# Patient Record
Sex: Female | Born: 1940 | Race: White | Hispanic: No | Marital: Married | Smoking: Current every day smoker
Health system: Southern US, Community
[De-identification: ages and names within clinical notes are randomized; demographics above are authoritative.]

## PROBLEM LIST (undated history)

## (undated) DIAGNOSIS — M545 Low back pain, unspecified: Secondary | ICD-10-CM

## (undated) DIAGNOSIS — C50419 Malignant neoplasm of upper-outer quadrant of unspecified female breast: Secondary | ICD-10-CM

## (undated) DIAGNOSIS — G20 Parkinson's disease: Secondary | ICD-10-CM

## (undated) DIAGNOSIS — Z923 Personal history of irradiation: Secondary | ICD-10-CM

## (undated) DIAGNOSIS — M810 Age-related osteoporosis without current pathological fracture: Secondary | ICD-10-CM

## (undated) DIAGNOSIS — G5730 Lesion of lateral popliteal nerve, unspecified lower limb: Secondary | ICD-10-CM

## (undated) DIAGNOSIS — M1990 Unspecified osteoarthritis, unspecified site: Secondary | ICD-10-CM

## (undated) DIAGNOSIS — E785 Hyperlipidemia, unspecified: Secondary | ICD-10-CM

## (undated) DIAGNOSIS — J45909 Unspecified asthma, uncomplicated: Secondary | ICD-10-CM

## (undated) DIAGNOSIS — I059 Rheumatic mitral valve disease, unspecified: Secondary | ICD-10-CM

## (undated) HISTORY — DX: Low back pain: M54.5

## (undated) HISTORY — DX: Parkinson's disease: G20

## (undated) HISTORY — DX: Unspecified asthma, uncomplicated: J45.909

## (undated) HISTORY — DX: Hyperlipidemia, unspecified: E78.5

## (undated) HISTORY — DX: Age-related osteoporosis without current pathological fracture: M81.0

## (undated) HISTORY — DX: Low back pain, unspecified: M54.50

## (undated) HISTORY — DX: Malignant neoplasm of upper-outer quadrant of unspecified female breast: C50.419

## (undated) HISTORY — DX: Unspecified osteoarthritis, unspecified site: M19.90

## (undated) HISTORY — DX: Rheumatic mitral valve disease, unspecified: I05.9

## (undated) HISTORY — DX: Lesion of lateral popliteal nerve, unspecified lower limb: G57.30

---

## 2006-04-20 HISTORY — PX: COLONOSCOPY: SHX174

## 2007-08-25 ENCOUNTER — Ambulatory Visit: Payer: Self-pay | Admitting: Endocrinology

## 2009-08-29 ENCOUNTER — Ambulatory Visit: Payer: Self-pay | Admitting: Endocrinology

## 2010-04-20 DIAGNOSIS — G20A1 Parkinson's disease without dyskinesia, without mention of fluctuations: Secondary | ICD-10-CM

## 2010-04-20 DIAGNOSIS — G20 Parkinson's disease: Secondary | ICD-10-CM

## 2010-04-20 DIAGNOSIS — J45909 Unspecified asthma, uncomplicated: Secondary | ICD-10-CM

## 2010-04-20 HISTORY — DX: Parkinson's disease: G20

## 2010-04-20 HISTORY — DX: Parkinson's disease without dyskinesia, without mention of fluctuations: G20.A1

## 2010-04-20 HISTORY — DX: Unspecified asthma, uncomplicated: J45.909

## 2010-09-17 ENCOUNTER — Ambulatory Visit: Payer: Self-pay | Admitting: Endocrinology

## 2010-09-29 ENCOUNTER — Ambulatory Visit: Payer: Self-pay | Admitting: Endocrinology

## 2011-04-21 DIAGNOSIS — C50419 Malignant neoplasm of upper-outer quadrant of unspecified female breast: Secondary | ICD-10-CM

## 2011-04-21 DIAGNOSIS — Z923 Personal history of irradiation: Secondary | ICD-10-CM

## 2011-04-21 HISTORY — DX: Personal history of irradiation: Z92.3

## 2011-04-21 HISTORY — DX: Malignant neoplasm of upper-outer quadrant of unspecified female breast: C50.419

## 2011-04-21 HISTORY — PX: BREAST SURGERY: SHX581

## 2011-10-26 ENCOUNTER — Ambulatory Visit: Payer: Self-pay | Admitting: General Surgery

## 2011-10-26 HISTORY — PX: BREAST BIOPSY: SHX20

## 2011-11-02 LAB — PATHOLOGY REPORT

## 2011-11-05 ENCOUNTER — Ambulatory Visit: Payer: Self-pay | Admitting: General Surgery

## 2011-11-05 LAB — COMPREHENSIVE METABOLIC PANEL
Albumin: 3.7 g/dL (ref 3.4–5.0)
Alkaline Phosphatase: 68 U/L (ref 50–136)
Anion Gap: 7 (ref 7–16)
BUN: 11 mg/dL (ref 7–18)
Bilirubin,Total: 1.1 mg/dL — ABNORMAL HIGH (ref 0.2–1.0)
Calcium, Total: 8.8 mg/dL (ref 8.5–10.1)
Chloride: 105 mmol/L (ref 98–107)
EGFR (African American): >60
Glucose: 85 mg/dL (ref 65–99)
Potassium: 4.1 mmol/L (ref 3.5–5.1)
SGOT(AST): 22 U/L (ref 15–37)
SGPT (ALT): 7 U/L — ABNORMAL LOW
Total Protein: 7.2 g/dL (ref 6.4–8.2)

## 2011-11-05 LAB — CBC WITH DIFFERENTIAL/PLATELET
Basophil %: 0.7 %
HCT: 38.8 % (ref 35.0–47.0)
HGB: 13.1 g/dL (ref 12.0–16.0)
Lymphocyte %: 37.3 %
Monocyte %: 8.7 %
Neutrophil #: 3.7 10*3/uL (ref 1.4–6.5)
Neutrophil %: 52.7 %
Platelet: 290 10*3/uL (ref 150–440)

## 2011-11-13 ENCOUNTER — Ambulatory Visit: Payer: Self-pay | Admitting: General Surgery

## 2011-11-13 HISTORY — PX: BREAST LUMPECTOMY: SHX2

## 2011-11-23 LAB — PATHOLOGY REPORT

## 2011-11-30 ENCOUNTER — Ambulatory Visit: Payer: Self-pay | Admitting: Radiation Oncology

## 2011-12-20 ENCOUNTER — Ambulatory Visit: Payer: Self-pay | Admitting: Radiation Oncology

## 2011-12-28 LAB — CBC CANCER CENTER
Basophil %: 0.9 %
Eosinophil %: 0.6 %
HGB: 14.4 g/dL (ref 12.0–16.0)
Lymphocyte %: 30.3 %
Monocyte %: 8.2 %
Neutrophil %: 60 %
RBC: 4.49 10*6/uL (ref 3.80–5.20)

## 2012-01-05 LAB — CBC CANCER CENTER
Basophil #: 0.1 x10 3/mm (ref 0.0–0.1)
Basophil %: 0.8 %
Eosinophil %: 0.8 %
HGB: 14.3 g/dL (ref 12.0–16.0)
Lymphocyte %: 29 %
MCH: 32 pg (ref 26.0–34.0)
Monocyte #: 0.7 x10 3/mm (ref 0.2–0.9)
Monocyte %: 9.6 %
Neutrophil %: 59.8 %
Platelet: 285 x10 3/mm (ref 150–440)
RBC: 4.46 10*6/uL (ref 3.80–5.20)
RDW: 13.9 % (ref 11.5–14.5)
WBC: 7 x10 3/mm (ref 3.6–11.0)

## 2012-01-11 LAB — CBC CANCER CENTER
Basophil %: 0.5 %
Eosinophil #: 0 x10 3/mm (ref 0.0–0.7)
Eosinophil %: 0.2 %
HGB: 14.1 g/dL (ref 12.0–16.0)
Lymphocyte %: 25 %
MCHC: 32.8 g/dL (ref 32.0–36.0)
MCV: 96 fL (ref 80–100)
Neutrophil %: 66 %
Platelet: 279 x10 3/mm (ref 150–440)
RBC: 4.49 10*6/uL (ref 3.80–5.20)

## 2012-01-18 LAB — CBC CANCER CENTER
Basophil #: 0 x10 3/mm (ref 0.0–0.1)
Basophil %: 0.8 %
Eosinophil #: 0 x10 3/mm (ref 0.0–0.7)
Eosinophil %: 0.7 %
HCT: 42.6 % (ref 35.0–47.0)
HGB: 14.1 g/dL (ref 12.0–16.0)
Lymphocyte %: 26.6 %
MCHC: 33.1 g/dL (ref 32.0–36.0)
Monocyte %: 10.2 %
Neutrophil #: 3.4 x10 3/mm (ref 1.4–6.5)
Neutrophil %: 61.7 %
RBC: 4.44 10*6/uL (ref 3.80–5.20)
WBC: 5.5 x10 3/mm (ref 3.6–11.0)

## 2012-01-19 ENCOUNTER — Ambulatory Visit: Payer: Self-pay | Admitting: Radiation Oncology

## 2012-01-25 LAB — CBC CANCER CENTER
Eosinophil #: 0 x10 3/mm (ref 0.0–0.7)
Eosinophil %: 0.6 %
HGB: 14 g/dL (ref 12.0–16.0)
Lymphocyte %: 16.2 %
MCH: 31.8 pg (ref 26.0–34.0)
MCHC: 33.2 g/dL (ref 32.0–36.0)
Monocyte #: 0.7 x10 3/mm (ref 0.2–0.9)
Neutrophil %: 72.7 %
Platelet: 245 x10 3/mm (ref 150–440)
RBC: 4.4 10*6/uL (ref 3.80–5.20)

## 2012-02-01 LAB — CBC CANCER CENTER
Eosinophil %: 1.3 %
HCT: 42.3 % (ref 35.0–47.0)
Lymphocyte #: 1 x10 3/mm (ref 1.0–3.6)
MCV: 96 fL (ref 80–100)
Monocyte %: 12.7 %
Neutrophil #: 3.2 x10 3/mm (ref 1.4–6.5)
RBC: 4.41 10*6/uL (ref 3.80–5.20)
WBC: 4.9 x10 3/mm (ref 3.6–11.0)

## 2012-02-19 ENCOUNTER — Ambulatory Visit: Payer: Self-pay | Admitting: Radiation Oncology

## 2012-03-20 ENCOUNTER — Ambulatory Visit: Payer: Self-pay | Admitting: Radiation Oncology

## 2012-07-19 ENCOUNTER — Encounter: Payer: Self-pay | Admitting: Neurology

## 2012-07-19 ENCOUNTER — Encounter: Payer: Self-pay | Admitting: *Deleted

## 2012-07-19 ENCOUNTER — Ambulatory Visit (INDEPENDENT_AMBULATORY_CARE_PROVIDER_SITE_OTHER): Payer: Medicare Other | Admitting: Neurology

## 2012-07-19 VITALS — BP 126/76 | HR 72 | Temp 98.5°F | Ht 66.0 in | Wt 132.0 lb

## 2012-07-19 DIAGNOSIS — M810 Age-related osteoporosis without current pathological fracture: Secondary | ICD-10-CM | POA: Insufficient documentation

## 2012-07-19 DIAGNOSIS — M25559 Pain in unspecified hip: Secondary | ICD-10-CM | POA: Insufficient documentation

## 2012-07-19 DIAGNOSIS — G5730 Lesion of lateral popliteal nerve, unspecified lower limb: Secondary | ICD-10-CM | POA: Insufficient documentation

## 2012-07-19 DIAGNOSIS — M25552 Pain in left hip: Secondary | ICD-10-CM

## 2012-07-19 DIAGNOSIS — M549 Dorsalgia, unspecified: Secondary | ICD-10-CM | POA: Insufficient documentation

## 2012-07-19 DIAGNOSIS — G20 Parkinson's disease: Secondary | ICD-10-CM

## 2012-07-19 DIAGNOSIS — C801 Malignant (primary) neoplasm, unspecified: Secondary | ICD-10-CM | POA: Insufficient documentation

## 2012-07-19 DIAGNOSIS — G20A1 Parkinson's disease without dyskinesia, without mention of fluctuations: Secondary | ICD-10-CM | POA: Insufficient documentation

## 2012-07-19 MED ORDER — RASAGILINE MESYLATE 1 MG PO TABS: 1.0000 mg | ORAL_TABLET | Freq: Every day | ORAL | Status: DC

## 2012-07-19 MED ORDER — CARBIDOPA-LEVODOPA 25-100 MG PO TABS: 0.5000 | ORAL_TABLET | Freq: Three times a day (TID) | ORAL | Status: DC

## 2012-07-19 NOTE — Progress Notes (Signed)
Subjective:    Patient ID: Jasmine Colon is a 72 y.o. female.  HPI Interim history:  Jasmine Colon is a very pleasant 72 year old right-handed lady with an underlying history of breast cancer, lower back pain, osteoporosis who presents for followup consultation of her right sided predominant Parkinson's disease of approximately 4 years duration. This is her first visit with me and she is accompanied by her husband today. She is a former patient of Dr. Imagene Gurney and have been seeing him for 2 years. She was last seen by him on 04/19/2012 at which time he felt she was stable. He encouraged her to exercise regularly and this is a four-month followup for her. She is having some depression symptoms but not enough to be treated at that point. She's currently on multivitamin, Azilect 1 mg once daily, carbidopa-levodopa 25/100 mg strength half a tablet 3 times a day, namely at 8 AM, 1 PM and 6 PM. She is on a baby aspirin once daily and calcium plus vitamin D. She had stiff and sore R shoulder before any of the PD Sx started. She drives well. She has been back to see her oncologist in January. She is due to go back in May or June. Mammogram in January was benign.   I reviewed Dr. Imagene Gurney prior notes and the patient's records and below is a summary of that review:  72 year old RH WF with an approx. 4 year history of R hand and arm tremor, associated with decreased flexibility in the right arm and cramps occurring in her right hand, arm, and foot. She also notices joint pain. She has soreness in her knees, back, and left hip. She has pain in her lower back radiating into her left leg behind the knee beginning 12/2010. Her symptoms of lower back pain are made worse by using a vacuum cleaner and picking up her grandchild. The patient's mother had Parkinson's disease. In 07/2010 she was placed on Sinemet by Dr. Patrecia Pace. She noticed significant improvement in her hand writing and right arm stiffness. She has a history of  constipation and decreased sense of smell. She has no history of exposure to herbicides or pesticides. She is not on any medications that may cause parkinsonism. Uric acid, CMP, TSH, thyroxine, T3 uptake, free thyroxine index, CBC, T12, folic acid, and vitamin D were normal in the past. Cholesterol was high at 219, HDL 85, and  LDL 117. The patient smokes cigarettes daily, and has been asked to discontinue smoking. She has been encouraged to exercise, but does not do so regularly. She denies memory loss but endorses depression. She is sleeping well. Her husband reports that she acts out in her dreams at night. She has bladder urgency. Falls assessment tool score was 3. She takes Aleve for her joint pains. X-rays of the left hip on 04/24/2011 showed DJD of the lumbar spine, but was otherwise negative. She works at her [employer] one day a week on Wednesdays and takes care of her grandson 2 days per week. She had a mammogram which was abnormal and breast biopsy showed cancer. She underwent R lumpectomy on 11/13/2011 followed by 39 XRT without chemotherapy. On her last visit with Dr. Sandria Manly on 04/19/2012 = MMSE 30/30, CDT 4/4, AFT 22, Geriatric depression scale 5/15.  Her Past Medical History Is Significant For: Past Medical History  Diagnosis Date  . Parkinson's disease   . Adenocarcinoma     right breast  . Lower back pain   . Osteoporosis   .  Lesion of lateral popliteal nerve   . Hyperlipidemia   . Mitral valve disorder   . Osteoarthritis   . Osteoporosis     Her Past Surgical History Is Significant For: Past Surgical History  Procedure Laterality Date  . Cesarean section  31 years ago    Her Family History Is Significant For: Family History  Problem Relation Age of Onset  . Heart disease Brother     bypass  . Stroke Mother   . Heart failure Father     Her Social History Is Significant For: History   Social History  . Marital Status: Married    Spouse Name: N/A    Number of  Children: N/A  . Years of Education: N/A   Occupational History  . part time     [employer]   Social History Main Topics  . Smoking status: Current Every Day Smoker -- 0.50 packs/day    Types: Cigarettes  . Smokeless tobacco: None  . Alcohol Use: No  . Drug Use: No  . Sexually Active: None   Other Topics Concern  . None   Social History Narrative  . None   Her Allergies Are:  No Known Allergies:   Her Current Medications Are:  Outpatient Encounter Prescriptions as of 07/19/2012  Medication Sig Dispense Refill  . anastrozole (ARIMIDEX) 1 MG tablet Take 1 mg by mouth daily.      Marland Kitchen aspirin 81 MG tablet Take 81 mg by mouth daily.      . Calcium Carbonate-Vitamin D (CALTRATE 600+D PO) Take 1 tablet by mouth 3 (three) times daily.      . carbidopa-levodopa (SINEMET IR) 25-100 MG per tablet Take 0.5 tablets by mouth 3 (three) times daily.      . Multiple Vitamins-Minerals (WOMENS 50+ MULTI VITAMIN/MIN PO) Take 1 tablet by mouth daily.      . rasagiline (AZILECT) 0.5 MG TABS Take 1 mg by mouth daily.       No facility-administered encounter medications on file as of 07/19/2012.  :  Review of Systems  All other systems reviewed and are negative.    Objective:  Neurologic Exam  Physical Exam Physical Examination:   Filed Vitals:   07/19/12 1005  BP: 126/76  Pulse: 72  Temp: 98.5 F (36.9 C)    General Examination: The patient is a very pleasant 72 y.o. female in no acute distress.  HEENT: Normocephalic, atraumatic, pupils are equal, round and reactive to light and accommodation. Funduscopic exam is normal with sharp disc margins noted. Extraocular tracking shows mild saccadic breakdown without nystagmus noted. There is no limitation to any gaze. There is mild decrease in eye blink rate. Hearing is intact. Tympanic membranes are clear bilaterally. Face is symmetric with mild facial masking and normal facial sensation. There is no lip, neck or jaw tremor. Neck is mildly  rigid with intact passive ROM. There are no carotid bruits on auscultation. Oropharynx exam reveals moderate mouth dryness. No significant airway crowding is noted. Mallampati is class II. Tongue protrudes centrally and palate elevates symmetrically.    Chest: is clear to auscultation without wheezing, rhonchi or crackles noted.  Heart: sounds are regular and normal without murmurs, rubs or gallops noted.   Abdomen: is soft, non-tender and non-distended with normal bowel sounds appreciated on auscultation.  Extremities: There is no pitting edema in the distal lower extremities bilaterally. Pedal pulses are intact.  Skin: is warm and dry with no trophic changes noted.  Musculoskeletal: exam reveals no  obvious joint deformities, tenderness or joint swelling or erythema.  Neurologically:  Mental status: The patient is awake and alert, paying good  attention. She is able to provide her history. She is oriented to: person, place, time/date, situation, day of week, month of year and year. Her memory, attention, language and knowledge are intact. There is no aphasia, agnosia, apraxia or anomia. There is no degree of bradyphrenia. Speech is mildly hypophonic with no dysarthria noted. Mood is congruent and affect is  normal.  Cranial nerves are as described above under HEENT exam. In addition, shoulder shrug is normal with equal shoulder height noted.  Motor exam: Normal bulk, and strength for age is noted. Tone is mildly rigid with presence of cogwheeling in the right extremities. There is overall mild bradykinesia. There is no drift or rebound. Romberg is negative. Reflexes are 1+ in the upper extremities and 1+ in the lower extremities. Fine motor skills exam reveals: Finger taps are mildly impaired on the right and normal impaired on the left. Hand movements are mildly impaired on the right and minimally impaired on the left. RAP (rapid alternating patting) is mildly impaired on the right and normal  impaired on the left. Foot taps are mildly impaired on the right and mildly impaired on the left. Foot agility (in the form of heel stomping) is mildly impaired on the right and mildly impaired on the left.    Cerebellar testing shows no dysmetria or intention tremor on finger to nose testing. Heel to shin is unremarkable bilaterally. There is no truncal or gait ataxia.   Sensory exam is intact to light touch, pinprick, vibration, temperature sense and proprioception in the upper and lower extremities.   Gait, station and balance exan: She stands up from the seated position with no difficulty. No veering to one side is noted. She is not noted to lean. Posture is mildly stooped. Stance is narrow-based. She walks with decrease in stride length and pace and decreased arm swing on the right. She turns in 2 steps. Balance is not impaired. . She is able to do a toe or heel stance.     Assessment and Plan:    Assessment and Plan:  In summary, TAVARIA MACKINS is a very pleasant 72 y.o.-year old female with a history of PD, right predominant and overall mild findings. She is doing well at this time.  I had a long chat with the patient and her husband about my findings and the diagnosis, its prognosis and treatment options. We talked about medical treatments and non-pharmacological approaches. We talked about smoking cessation and maintaining a healthy lifestyle in general. I encouraged the patient to eat healthy, exercise daily and keep well hydrated, to keep a scheduled bedtime and wake time routine, to not skip any meals and eat healthy snacks in between meals and to have protein with every meal.   I recommended the following at this time: I recommended no change in her medications. I answered all their questions today and the patient and her husband were in agreement with the plan. I would like to see her back in 6 months, sooner if the need arises and encouraged them to call with any interim questions,  concerns, problems or updates and refill requests.

## 2012-07-19 NOTE — Patient Instructions (Addendum)
I think overall you are doing fairly well and are stable at this point.  I do have some generic suggestions for you today:  Please make sure that you drink plenty of fluids. I would like for you to exercise daily for example in the form of walking 20-30 minutes every day, if you can. Please keep a regular sleep-wake schedule, keep regular meal times, do not skip any meals, eat  healthy snacks in between meals, such as fruit or nuts. Try to eat protein with every meal.   Please stop smoking.   Engage in social activities in your community and with your family and try to keep up with current events by reading the newspaper or watching the news.  I do not think we need to make any changes in your medications at this point. I think you're stable enough that I can see you back in 6 months, sooner if we need to. Please call us if you have any interim questions, concerns, or problems or updates to need to discuss.  Brett Canales is my clinical assistant and will answer any of your questions and relay your messages to me and will give you my messages.   Our phone number is [phone]. We also have an after hours call service for urgent matters and there is a physician on-call for urgent questions. For any emergencies you know to call 911 or go to the nearest emergency room.

## 2012-08-19 ENCOUNTER — Ambulatory Visit: Payer: Self-pay | Admitting: Radiation Oncology

## 2012-09-18 ENCOUNTER — Ambulatory Visit: Payer: Self-pay | Admitting: Radiation Oncology

## 2012-11-04 ENCOUNTER — Encounter: Payer: Self-pay | Admitting: General Surgery

## 2012-11-15 ENCOUNTER — Ambulatory Visit: Payer: Medicare Other | Admitting: General Surgery

## 2012-12-07 ENCOUNTER — Encounter: Payer: Self-pay | Admitting: General Surgery

## 2012-12-07 ENCOUNTER — Ambulatory Visit (INDEPENDENT_AMBULATORY_CARE_PROVIDER_SITE_OTHER): Payer: Medicare Other | Admitting: General Surgery

## 2012-12-07 VITALS — BP 120/70 | HR 64 | Resp 12 | Ht 66.0 in | Wt 134.0 lb

## 2012-12-07 DIAGNOSIS — Z853 Personal history of malignant neoplasm of breast: Secondary | ICD-10-CM

## 2012-12-07 NOTE — Progress Notes (Signed)
Patient ID: Jasmine Colon, female   DOB: 17-Aug-1940, 72 y.o.   MRN: [id number]  Chief Complaint  Patient presents with  . Follow-up    6 month diagnostic mammogram     HPI Jasmine Colon is a 72 y.o. female who presents for a 6 month breast evaluation. The most recent mammogram was a diagnostic mammogram done on 11/04/12 with a birad catory 2. The patient gets regular breast mammograms and does regular self breast checks. She denies any new problems with her breasts at this time. She is 1 year post right breast lumpectomy and Sentinel Node biopsy, radiation for stage 1 cancer. Currently on Anastrozole.  HPI  Past Medical History  Diagnosis Date  . Parkinson's disease   . Adenocarcinoma     right breast  . Lower back pain   . Osteoporosis   . Lesion of lateral popliteal nerve   . Hyperlipidemia   . Mitral valve disorder   . Osteoarthritis   . Osteoporosis   . Asthma 2012  . Parkinson's disease 2012  . Personal history of malignant neoplasm of breast   . Malignant neoplasm of upper-outer quadrant of female breast 2013    T1,N0,M0. Hormone pos, Her 2 no amplified.     Past Surgical History  Procedure Laterality Date  . Breast surgery Right 2013    wide excision  . Colonoscopy  2008  . Cesarean section  31 years ago    Family History  Problem Relation Age of Onset  . Heart disease Brother     bypass  . Stroke Mother   . Heart failure Father   . Cancer Maternal Aunt     breast   . Cancer Maternal Aunt     breast    Social History History  Substance Use Topics  . Smoking status: Current Every Day Smoker -- 0.50 packs/day for 45 years    Types: Cigarettes  . Smokeless tobacco: Never Used  . Alcohol Use: No    Allergies  Allergen Reactions  . Codeine     Feels weird    Current Outpatient Prescriptions  Medication Sig Dispense Refill  . anastrozole (ARIMIDEX) 1 MG tablet Take 1 mg by mouth daily.      Marland Kitchen aspirin 81 MG tablet Take 81 mg by mouth daily.      .  Calcium Carbonate-Vitamin D (CALTRATE 600+D PO) Take 1 tablet by mouth 3 (three) times daily.      . carbidopa-levodopa (SINEMET IR) 25-100 MG per tablet Take 0.5 tablets by mouth 3 (three) times daily.  135 tablet  3  . ibandronate (BONIVA) 150 MG tablet Take 1 tablet by mouth every 30 (thirty) days.      . Multiple Vitamins-Minerals (WOMENS 50+ MULTI VITAMIN/MIN PO) Take 1 tablet by mouth daily.      . rasagiline (AZILECT) 1 MG TABS Take 1 tablet (1 mg total) by mouth daily.  90 tablet  3   No current facility-administered medications for this visit.    Review of Systems Review of Systems  Constitutional: Negative.   Respiratory: Negative.   Cardiovascular: Negative.     Blood pressure 120/70, pulse 64, resp. rate 12, height 5\' 6"  (1.676 m), weight 134 lb (60.782 kg).  Physical Exam Physical Exam  Constitutional: She is oriented to person, place, and time. She appears well-developed and well-nourished.  Eyes: Conjunctivae are normal. No scleral icterus.  Neck: No thyromegaly present.  Cardiovascular: Normal rate, regular rhythm and normal heart sounds.  No murmur heard. Pulses:      Dorsalis pedis pulses are 2+ on the right side, and 2+ on the left side.       Posterior tibial pulses are 2+ on the right side, and 2+ on the left side.  No edema  Pulmonary/Chest: Effort normal and breath sounds normal. Right breast exhibits no inverted nipple, no mass, no nipple discharge, no skin change and no tenderness. Left breast exhibits no inverted nipple, no mass, no nipple discharge, no skin change and no tenderness.  Right breast well healed lumpectomy site in the upper outer quadrant.   Abdominal: Soft. Normal appearance and bowel sounds are normal. There is no hepatosplenomegaly. There is no tenderness. No hernia.  Lymphadenopathy:    She has no cervical adenopathy.    She has no axillary adenopathy.  Neurological: She is alert and oriented to person, place, and time.  Skin: Skin is  warm and dry.    Data Reviewed  Mammogram reviewed with no abnormalities.   Assessment    Exam stable.     Plan    6 months right diagnostic mammogram at [facility].        SANKAR,SEEPLAPUTHUR G 12/07/2012, 1:45 PM

## 2012-12-07 NOTE — Patient Instructions (Addendum)
Patient to return in 6 months with right breast mammogram. 

## 2013-01-10 ENCOUNTER — Other Ambulatory Visit: Payer: Self-pay | Admitting: General Surgery

## 2013-01-19 ENCOUNTER — Encounter: Payer: Self-pay | Admitting: Neurology

## 2013-01-19 ENCOUNTER — Ambulatory Visit (INDEPENDENT_AMBULATORY_CARE_PROVIDER_SITE_OTHER): Payer: Medicare Other | Admitting: Neurology

## 2013-01-19 VITALS — BP 127/82 | HR 77 | Temp 98.8°F | Ht 64.75 in | Wt 133.0 lb

## 2013-01-19 DIAGNOSIS — M25562 Pain in left knee: Secondary | ICD-10-CM

## 2013-01-19 DIAGNOSIS — M549 Dorsalgia, unspecified: Secondary | ICD-10-CM

## 2013-01-19 DIAGNOSIS — M25569 Pain in unspecified knee: Secondary | ICD-10-CM

## 2013-01-19 DIAGNOSIS — G20 Parkinson's disease: Secondary | ICD-10-CM

## 2013-01-19 DIAGNOSIS — F17200 Nicotine dependence, unspecified, uncomplicated: Secondary | ICD-10-CM

## 2013-01-19 DIAGNOSIS — M25561 Pain in right knee: Secondary | ICD-10-CM

## 2013-01-19 NOTE — Progress Notes (Signed)
Subjective:    Colon ID: Jasmine Colon is a 72 y.o. female.  HPI  Interim history:   Jasmine Colon is a very pleasant 72 year old right-handed lady with an underlying history of breast cancer, lower back pain, and osteoporosis who presents for followup consultation of her right sided predominant Parkinson's disease of approximately 4 1/2 years' duration. Jasmine Colon is unaccompanied today. I first met her on 07/19/2012, which time I did not make any medication changes. Jasmine Colon has no new complaints, except LBP and occasional bilateral knee discomfort, especially when going down stairs. Jasmine Colon has never been on a DA before. Jasmine Colon has an abscessed tooth and is on amoxicillin and has root canal scheduled for next week. Jasmine Colon is taking Advil and tylenol.   Jasmine Colon is a former Colon of Dr. Imagene Gurney and saw him for about 2 years and was last seen by him on 04/19/2012 at which time he felt Jasmine Colon was stable. He encouraged her to exercise regularly. Jasmine Colon was having some depressive symptoms but not enough to be treated. Jasmine Colon is currently on multivitamin, Azilect 1 mg once daily, carbidopa-levodopa 25/100 mg strength half a tablet 3 times a day, namely at 8 AM, 1 PM and 6 PM. Jasmine Colon is on a baby aspirin once daily and calcium plus vitamin D. Jasmine Colon had stiff and sore R shoulder before any of Jasmine PD Sx started and was started on L-dopa therapy by her PCP. Jasmine Colon continues to drive. Jasmine Colon has follows with her oncologist regularly. Mammogram in January 2014 was benign.  Jasmine Colon has a 4+ year history of R hand and arm tremor, associated with decreased flexibility in Jasmine right arm and cramps occurring in her right hand, arm, and foot. Jasmine Colon also noticed joint pain. Jasmine Colon has soreness in her knees, back, and left hip. Jasmine Colon has pain in her lower back radiating into her left leg behind Jasmine knee beginning 12/2010. Her symptoms of lower back pain are made worse by using a vacuum cleaner and picking up her grandchild. Jasmine Colon's mother had Parkinson's disease. In 07/2010  Jasmine Colon was placed on Sinemet by Dr. Patrecia Pace. Jasmine Colon noticed significant improvement in her hand writing and right arm stiffness. Jasmine Colon has a history of constipation and decreased sense of smell. Jasmine Colon has no history of exposure to herbicides or pesticides. Jasmine Colon was not on any medications that may cause parkinsonism. Uric acid, CMP, TSH, thyroxine, T3 uptake, free thyroxine index, CBC, T12, folic acid, and vitamin D were normal in Jasmine past. Cholesterol was mildly elevated in Jasmine past at 219, HDL 85, and LDL 117. Jasmine Colon continues to smoke cigarettes daily, and has been asked to discontinue smoking, but still smokes some. Jasmine Colon has been encouraged to exercise, but does not do so regularly. Jasmine Colon denies memory loss but endorses depression. Jasmine Colon is sleeping well. Her husband reports that Jasmine Colon acts out in her dreams at night. Jasmine Colon has bladder urgency. Jasmine Colon takes Aleve for her joint pains. X-rays of Jasmine left hip on 04/24/2011 showed DJD of Jasmine lumbar spine, but was otherwise negative.  Jasmine Colon works at her [employer] one day a week on Wednesdays and takes care of her grandson 4 days per week.  Jasmine Colon underwent R lumpectomy on 11/13/2011 followed by 39 XRT without chemotherapy. On her last visit with Dr. Sandria Manly on 04/19/2012, her MMSE was 30/30, CDT was 4/4, AFT was 22, Geriatric depression scale was 5/15.   Her Past Medical History Is Significant For: Past Medical History  Diagnosis Date  . Parkinson's disease   .  Adenocarcinoma     right breast  . Lower back pain   . Osteoporosis   . Lesion of lateral popliteal nerve   . Hyperlipidemia   . Mitral valve disorder   . Osteoarthritis   . Osteoporosis   . Asthma 2012  . Parkinson's disease 2012  . Personal history of malignant neoplasm of breast   . Malignant neoplasm of upper-outer quadrant of female breast 2013    T1,N0,M0. Hormone pos, Her 2 no amplified.    Her Past Surgical History Is Significant For: Past Surgical History  Procedure Laterality Date  . Breast  surgery Right 2013    wide excision  . Colonoscopy  2008  . Cesarean section  31 years ago   Her Family History Is Significant For: Family History  Problem Relation Age of Onset  . Heart disease Brother     bypass  . Stroke Mother   . Heart failure Father   . Cancer Maternal Aunt     breast   . Cancer Maternal Aunt     breast   Her Social History Is Significant For: History   Social History  . Marital Status: Married    Spouse Name: ronald    Number of Children: 2  . Years of Education: college   Occupational History  . part time     [employer]   Social History Main Topics  . Smoking status: Current Every Day Smoker -- 0.50 packs/day for 45 years    Types: Cigarettes  . Smokeless tobacco: Never Used  . Alcohol Use: No  . Drug Use: No  . Sexual Activity: None   Other Topics Concern  . None   Social History Narrative  . None   Her Allergies Are:  Allergies  Allergen Reactions  . Codeine     Feels weird  :  Her Current Medications Are:  Outpatient Encounter Prescriptions as of 01/19/2013  Medication Sig Dispense Refill  . amoxicillin (AMOXIL) 500 MG capsule Take 500 mg by mouth 3 (three) times daily.      Marland Kitchen anastrozole (ARIMIDEX) 1 MG tablet TAKE 1 TABLET BY MOUTH EVERY DAY  30 tablet  10  . aspirin 81 MG tablet Take 81 mg by mouth daily.      . Calcium Carbonate-Vitamin D (CALTRATE 600+D PO) Take 1 tablet by mouth 3 (three) times daily.      . carbidopa-levodopa (SINEMET IR) 25-100 MG per tablet Take 0.5 tablets by mouth 3 (three) times daily.  135 tablet  3  . ibandronate (BONIVA) 150 MG tablet Take 1 tablet by mouth every 30 (thirty) days.      . Ibuprofen (ADVIL) 200 MG CAPS Take 200 mg by mouth as needed.      . rasagiline (AZILECT) 1 MG TABS Take 1 tablet (1 mg total) by mouth daily.  90 tablet  3  . [DISCONTINUED] Multiple Vitamins-Minerals (WOMENS 50+ MULTI VITAMIN/MIN PO) Take 1 tablet by mouth daily.       No facility-administered encounter  medications on file as of 01/19/2013.  :  Review of Systems  All other systems reviewed and are negative.   Objective:  Neurologic Exam  Physical Exam Physical Examination:   Filed Vitals:   01/19/13 1129  BP: 127/82  Pulse: 77  Temp: 98.8 F (37.1 C)    General Examination: Jasmine Colon is a very pleasant 72 y.o. female in no acute distress.  HEENT: Normocephalic, atraumatic, pupils are equal, round and reactive to light and  accommodation. Extraocular tracking shows mild saccadic breakdown without nystagmus noted. There is no limitation to her gaze. There is mild decrease in eye blink rate. Hearing is intact. Face is symmetric with mild facial masking and normal facial sensation. There is no lip, neck or jaw tremor. Neck is mildly rigid with intact passive ROM. There are no carotid bruits on auscultation. Oropharynx exam reveals moderate mouth dryness. No significant airway crowding is noted. Mallampati is class II. Tongue protrudes centrally and palate elevates symmetrically.    Chest: is clear to auscultation without wheezing, rhonchi or crackles noted.  Heart: sounds are regular and normal without murmurs, rubs or gallops noted.   Abdomen: is soft, non-tender and non-distended with normal bowel sounds appreciated on auscultation.  Extremities: There is no pitting edema in Jasmine distal lower extremities bilaterally. Pedal pulses are intact.  Skin: is warm and dry with no trophic changes noted.  Musculoskeletal: exam reveals no obvious joint deformities, tenderness or joint swelling or erythema, but there is mild crepitation in both knees.  Neurologically:  Mental status: Jasmine Colon is awake and alert, paying good  attention. Jasmine Colon is able to provide her history well. Jasmine Colon is oriented to: person, place, time/date, situation, day of week, month of year and year. Her memory, attention, language and knowledge are intact. There is no aphasia, agnosia, apraxia or anomia. There is no  degree of bradyphrenia. Speech is mildly hypophonic with no dysarthria noted. Mood is congruent and affect is  normal.  Cranial nerves are as described above under HEENT exam. In addition, shoulder shrug is normal with unequal shoulder height noted, R side lower than L.  Motor exam: Normal bulk, and strength for age is noted. Tone is mildly rigid with presence of cogwheeling on Jasmine right extremities. There is overall mild bradykinesia. There is no drift or rebound. Romberg is negative. Reflexes are 1+ in Jasmine upper extremities and 1+ in Jasmine lower extremities. Fine motor skills exam reveals: Finger taps are mildly impaired on Jasmine right and normal impaired on Jasmine left. Hand movements are mildly impaired on Jasmine right and minimally impaired on Jasmine left. RAP (rapid alternating patting) is mildly impaired on Jasmine right and normal impaired on Jasmine left. Foot taps are mildly impaired on Jasmine right and mildly impaired on Jasmine left. Foot agility (in Jasmine form of heel stomping) is mildly impaired on Jasmine right and mildly impaired on Jasmine left.    Cerebellar testing shows no dysmetria or intention tremor on finger to nose testing. There is no truncal or gait ataxia.   Sensory exam is intact to light touch.    Gait, station and balance exam: Jasmine Colon stands up from Jasmine seated position with no difficulty. No veering to one side is noted. Jasmine Colon is not noted to lean. Posture is mildly stooped. Stance is narrow-based. Jasmine Colon walks with decrease in stride length but good pace and decreased arm swing on Jasmine right. Jasmine Colon turns in 2 steps. Balance is not impaired.   Assessment and Plan:    In summary, TOSHIA LARKIN is a very pleasant 72 year old female with a history of PD, right predominant and overall mild and stable findings. Jasmine Colon is doing well at this time.  I had a long chat with Jasmine Colon about my findings and Jasmine diagnosis of PD and Jasmine difference between atypical parkinsonism and idiopathic PD, its prognosis and treatment options. We  talked about medical treatments and non-pharmacological approaches. We talked about smoking cessation again and maintaining a healthy  lifestyle in general. I encouraged Jasmine Colon to eat healthy, exercise daily and keep well hydrated, to keep a scheduled bedtime and wake time routine, to not skip any meals and eat healthy snacks in between meals and to have protein with every meal. Given her cancer history Jasmine Colon is strongly discouraged from smoking.   I recommended no change in her medications. Down Jasmine [address] we may try her on a dopamine agonist. I provided her with information regarding additional financial support for patients on Azilect.  I answered all their questions today and Jasmine Colon was in agreement with Jasmine plan. I would like to see her back in 6 months, sooner if Jasmine need arises and encouraged them to call with any interim questions, concerns, problems or updates and refill requests.

## 2013-01-19 NOTE — Patient Instructions (Signed)
I think overall you are doing fairly well and are stable at this point.   I do have some generic suggestions for you today:  Please make sure that you drink plenty of fluids. I would like for you to exercise daily for example in the form of walking 20-30 minutes every day, if you can. Please keep a regular sleep-wake schedule, keep regular meal times, do not skip any meals, eat  healthy snacks in between meals, such as fruit or nuts. Try to eat protein with every meal.   As far as your medications are concerned, I would like to suggest: no changes.    As far as diagnostic testing, I recommend: no new test needed today.  Engage in social activities in your community and with your family and try to keep up with current events by reading the newspaper or watching the news.  Please stop smoking.   I do not think we need to make any changes in your medications at this point. I think you're stable enough that I can see you back in 6 months, sooner if we need to. Please call us if you have any interim questions, concerns, or problems or updates to need to discuss.  Brett Canales is my clinical assistant and will answer any of your questions and relay your messages to me and will give you my messages.   Our phone number is [phone]. We also have an after hours call service for urgent matters and there is a physician on-call for urgent questions. For any emergencies you know to call 911 or go to the nearest emergency room.

## 2013-06-08 ENCOUNTER — Encounter: Payer: Self-pay | Admitting: General Surgery

## 2013-06-20 ENCOUNTER — Encounter: Payer: Self-pay | Admitting: General Surgery

## 2013-06-20 ENCOUNTER — Ambulatory Visit (INDEPENDENT_AMBULATORY_CARE_PROVIDER_SITE_OTHER): Payer: Medicare HMO | Admitting: General Surgery

## 2013-06-20 VITALS — BP 114/80 | HR 68 | Resp 12 | Ht 65.0 in | Wt 128.0 lb

## 2013-06-20 DIAGNOSIS — Z853 Personal history of malignant neoplasm of breast: Secondary | ICD-10-CM

## 2013-06-20 DIAGNOSIS — C50419 Malignant neoplasm of upper-outer quadrant of unspecified female breast: Secondary | ICD-10-CM

## 2013-06-20 NOTE — Progress Notes (Signed)
Patient ID: Jasmine Colon, female   DOB: 1941/02/22, 73 y.o.   MRN: [id number]  Chief Complaint  Patient presents with  . Follow-up    6 month follow up right diagnostic mammogram. History of breast cancer    HPI Jasmine Colon is a 73 y.o. female who presents for a breast evaluation. The most recent mammogram was done on 06/08/13. Patient does perform regular self breast checks and gets regular mammograms done.  The patient has a history of right breast cancer diagnosed in 2013. The patient denies any problems at this time. The patient is on Arimidex currently. She denies any problems with this medication.    HPI  Past Medical History  Diagnosis Date  . Parkinson's disease   . Adenocarcinoma     right breast  . Lower back pain   . Osteoporosis   . Lesion of lateral popliteal nerve   . Hyperlipidemia   . Mitral valve disorder   . Osteoarthritis   . Osteoporosis   . Asthma 2012  . Parkinson's disease 2012  . Personal history of malignant neoplasm of breast   . Malignant neoplasm of upper-outer quadrant of female breast 2013    T1,N0,M0. Hormone pos, Her 2 no amplified.     Past Surgical History  Procedure Laterality Date  . Breast surgery Right 2013    wide excision  . Colonoscopy  2008  . Cesarean section  31 years ago    Family History  Problem Relation Age of Onset  . Heart disease Brother     bypass  . Stroke Mother   . Heart failure Father   . Cancer Maternal Aunt     breast   . Cancer Maternal Aunt     breast    Social History History  Substance Use Topics  . Smoking status: Current Every Day Smoker -- 0.50 packs/day for 45 years    Types: Cigarettes  . Smokeless tobacco: Never Used  . Alcohol Use: No    Allergies  Allergen Reactions  . Codeine     Feels weird    Current Outpatient Prescriptions  Medication Sig Dispense Refill  . anastrozole (ARIMIDEX) 1 MG tablet TAKE 1 TABLET BY MOUTH EVERY DAY  30 tablet  10  . aspirin 81 MG tablet Take  81 mg by mouth daily.      . Calcium Carbonate-Vitamin D (CALTRATE 600+D PO) Take 1 tablet by mouth 3 (three) times daily.      . carbidopa-levodopa (SINEMET IR) 25-100 MG per tablet Take 0.5 tablets by mouth 3 (three) times daily.  135 tablet  3  . ibandronate (BONIVA) 150 MG tablet Take 1 tablet by mouth every 30 (thirty) days.      . Ibuprofen (ADVIL) 200 MG CAPS Take 200 mg by mouth as needed.      . Magnesium 400 MG CAPS Take 1 tablet by mouth daily.      . rasagiline (AZILECT) 1 MG TABS Take 1 tablet (1 mg total) by mouth daily.  90 tablet  3   No current facility-administered medications for this visit.    Review of Systems Review of Systems  Constitutional: Negative.   Respiratory: Negative.   Cardiovascular: Negative.     Blood pressure 114/80, pulse 68, resp. rate 12, height 5\' 5"  (1.651 m), weight 128 lb (58.06 kg).  Physical Exam Physical Exam  Constitutional: She is oriented to person, place, and time. She appears well-developed and well-nourished.  Eyes: Conjunctivae are normal. No  scleral icterus.  Neck: Neck supple. No thyromegaly present.  Cardiovascular: Normal rate, regular rhythm, normal heart sounds and normal pulses.   No murmur heard. Pulses:      Dorsalis pedis pulses are 2+ on the right side, and 2+ on the left side.       Posterior tibial pulses are 2+ on the right side, and 2+ on the left side.  No edema. No skin changes. No varicose veins.   Pulmonary/Chest: Effort normal and breath sounds normal. Right breast exhibits no inverted nipple, no mass, no nipple discharge, no skin change and no tenderness. Left breast exhibits no inverted nipple, no mass, no nipple discharge, no skin change and no tenderness.  Lymphadenopathy:    She has no cervical adenopathy.    She has no axillary adenopathy.  Neurological: She is alert and oriented to person, place, and time. She has normal strength. No sensory deficit.  Skin: Skin is warm and dry.    Data  Reviewed  Mammogram reviewed.   Assessment    Stable exam. Patient now 18 months post treatment for right breast stage 1 cancer. Doing well with Arimidex.     Plan    Patient to return in 6 months with bilateral diagnostic mammogram. Order CA 27- 29 today.         Brynlie Daza G 06/21/2013, 11:32 AM

## 2013-06-20 NOTE — Patient Instructions (Addendum)
Patient to return in 6 months with bilateral diagnostic mammogram. . Patient to continue self breast checks on a monthly basis. Patient advised to call our office with any questions or concerns.

## 2013-06-21 ENCOUNTER — Telehealth: Payer: Self-pay | Admitting: *Deleted

## 2013-06-21 ENCOUNTER — Encounter: Payer: Self-pay | Admitting: General Surgery

## 2013-06-21 LAB — CANCER ANTIGEN 27.29: CA 27.29: 37.1 U/mL (ref 0.0–38.6)

## 2013-06-21 NOTE — Telephone Encounter (Signed)
Message copied by Carson Myrtle on Wed Jun 21, 2013  3:44 PM ------      Message from: Christene Lye      Created: Wed Jun 21, 2013 11:34 AM       Let pt know her CA 27-29 was near upper limit of normal. Need to repeat it before her next visit in 6 mos. ------

## 2013-06-21 NOTE — Telephone Encounter (Signed)
Notified patient as instructed, patient pleased. Discussed follow-up appointments, patient agrees  

## 2013-06-26 ENCOUNTER — Encounter: Payer: Self-pay | Admitting: General Surgery

## 2013-07-07 ENCOUNTER — Encounter: Payer: Self-pay | Admitting: Neurology

## 2013-07-07 ENCOUNTER — Telehealth: Payer: Self-pay | Admitting: Neurology

## 2013-07-07 NOTE — Telephone Encounter (Signed)
Left message for patient regarding rescheduling 07/25/13 appointment to 12/07/13 per Dr. Guadelupe Sabin schedule. Printed and sent letter.

## 2013-07-25 ENCOUNTER — Ambulatory Visit: Payer: Medicare Other | Admitting: Neurology

## 2013-08-21 ENCOUNTER — Ambulatory Visit: Payer: Self-pay | Admitting: Radiation Oncology

## 2013-08-28 ENCOUNTER — Other Ambulatory Visit: Payer: Self-pay | Admitting: Neurology

## 2013-10-27 ENCOUNTER — Ambulatory Visit: Payer: Self-pay | Admitting: Endocrinology

## 2013-11-23 ENCOUNTER — Other Ambulatory Visit: Payer: Self-pay | Admitting: Neurology

## 2013-11-23 NOTE — Telephone Encounter (Signed)
Patient has appt scheduled this month.  Former [facility] patient assigned to Dr Rexene Alberts

## 2013-12-06 ENCOUNTER — Other Ambulatory Visit: Payer: Self-pay

## 2013-12-06 DIAGNOSIS — C50411 Malignant neoplasm of upper-outer quadrant of right female breast: Secondary | ICD-10-CM

## 2013-12-07 ENCOUNTER — Ambulatory Visit (INDEPENDENT_AMBULATORY_CARE_PROVIDER_SITE_OTHER): Payer: Medicare HMO | Admitting: Neurology

## 2013-12-07 ENCOUNTER — Encounter: Payer: Self-pay | Admitting: Neurology

## 2013-12-07 VITALS — BP 126/76 | HR 69 | Temp 97.4°F | Ht 65.0 in | Wt 127.5 lb

## 2013-12-07 DIAGNOSIS — G20 Parkinson's disease: Secondary | ICD-10-CM

## 2013-12-07 DIAGNOSIS — F17200 Nicotine dependence, unspecified, uncomplicated: Secondary | ICD-10-CM

## 2013-12-07 MED ORDER — RASAGILINE MESYLATE 1 MG PO TABS: 1.0000 mg | ORAL_TABLET | Freq: Every day | ORAL | Status: DC

## 2013-12-07 MED ORDER — CARBIDOPA-LEVODOPA 25-100 MG PO TABS: 0.5000 | ORAL_TABLET | Freq: Three times a day (TID) | ORAL | Status: DC

## 2013-12-07 NOTE — Progress Notes (Signed)
Subjective:    Patient ID: Jasmine Colon is a 73 y.o. female.  HPI    Interim history:   Jasmine Colon is a very pleasant 73 year old right-handed lady with an underlying history of breast cancer, lower back pain, and osteoporosis who presents for followup consultation of her right sided predominant Parkinson's disease of approximately 4 1/2 years' duration. She is unaccompanied today. I last saw her on 01/19/2013, at which time I suggested she continue with her medication unchanged. I considered adding a dopamine agonist.   Today, she reports feeling stable, she drives without issues. She tends to take her levodopa with food, but does not eat breakfast normally.   I first met her on 07/19/2012, which time I did not make any medication changes. She has no new complaints, except LBP and occasional bilateral knee discomfort, especially when going down stairs. She has never been on a DA before. She has an abscessed tooth and is on amoxicillin and has root canal scheduled for next week. She is taking Advil and tylenol.  She is a former patient of Dr. Tressia Danas and saw him for about 2 years and was last seen by him on 04/19/2012 at which time he felt she was stable. He encouraged her to exercise regularly. She was having some depressive symptoms but not enough to be treated. She is currently on multivitamin, Azilect 1 mg once daily, carbidopa-levodopa 25/100 mg strength half a tablet 3 times a day, namely at 8 AM, 1 PM and 6 PM. She is on a baby aspirin once daily and calcium plus vitamin D. She had stiff and sore R shoulder before any of the PD Sx started and was started on L-dopa therapy by her PCP. She continues to drive. She has follows with her oncologist regularly. Mammogram in January 2014 was benign.  She has a 4+ year history of R hand and arm tremor, associated with decreased flexibility in the right arm and cramps occurring in her right hand, arm, and foot. She also noticed joint pain. She has  soreness in her knees, back, and left hip. She has pain in her lower back radiating into her left leg behind the knee beginning 12/2010. Her symptoms of lower back pain are made worse by using a vacuum cleaner and picking up her grandchild. The patient's mother had Parkinson's disease. In 07/2010 she was placed on Sinemet by Dr. Ronnald Collum. She noticed significant improvement in her hand writing and right arm stiffness. She has a history of constipation and decreased sense of smell. She has no history of exposure to herbicides or pesticides. She was not on any medications that may cause parkinsonism. Uric acid, CMP, TSH, thyroxine, T3 uptake, free thyroxine index, CBC, K74, folic acid, and vitamin D were normal in the past. Cholesterol was mildly elevated in the past at 219, HDL 85, and LDL 117. The patient continues to smoke cigarettes daily, and has been asked to discontinue smoking, but still smokes some. She has been encouraged to exercise, but does not do so regularly. She denies memory loss but endorses depression. She is sleeping well. Her husband reports that she acts out in her dreams at night. She has bladder urgency. She takes Aleve for her joint pains. X-rays of the left hip on 04/24/2011 showed DJD of the lumbar spine, but was otherwise negative.  She works at her [employer] one day a week on Wednesdays and takes care of her grandson 4 days per week.  She underwent R lumpectomy on  11/13/2011 followed by 76 XRT without chemotherapy. On her last visit with Dr. Erling Cruz on 04/19/2012, her MMSE was 30/30, CDT was 4/4, AFT was 22, Geriatric depression scale was 5/15.   Her Past Medical History Is Significant For: Past Medical History  Diagnosis Date  . Parkinson's disease   . Adenocarcinoma     right breast  . Lower back pain   . Osteoporosis   . Lesion of lateral popliteal nerve   . Hyperlipidemia   . Mitral valve disorder   . Osteoarthritis   . Osteoporosis   . Asthma 2012  . Parkinson's  disease 2012  . Personal history of malignant neoplasm of breast   . Malignant neoplasm of upper-outer quadrant of female breast 2013    T1,N0,M0. Hormone pos, Her 2 no amplified.     Her Past Surgical History Is Significant For: Past Surgical History  Procedure Laterality Date  . Breast surgery Right 2013    wide excision  . Colonoscopy  2008  . Cesarean section  31 years ago    Her Family History Is Significant For: Family History  Problem Relation Age of Onset  . Heart disease Brother     bypass  . Stroke Mother   . Heart failure Father   . Cancer Maternal Aunt     breast   . Cancer Maternal Aunt     breast    Her Social History Is Significant For: History   Social History  . Marital Status: Married    Spouse Name: Jori Moll    Number of Children: 2  . Years of Education: college   Occupational History  . 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 Smoking status: Current Every Day Smoker -- 0.50 packs/day for 45 years    Types: Cigarettes  . Smokeless tobacco: Never Used  . Alcohol Use: No  . Drug Use: No  . Sexual Activity: None   Other Topics Concern  . None   Social History Narrative   Patient is right handed, resides with husband    Her Allergies Are:  Allergies  Allergen Reactions  . Codeine     Feels weird  :   Her Current Medications Are:  Outpatient Encounter Prescriptions as of 12/07/2013  Medication Sig  . anastrozole (ARIMIDEX) 1 MG tablet TAKE 1 TABLET BY MOUTH EVERY DAY  . aspirin 81 MG tablet Take 81 mg by mouth daily.  . AZILECT 1 MG TABS tablet TAKE 1 TABLET (1 MG TOTAL) BY MOUTH DAILY.  . Calcium Carbonate-Vitamin D (CALTRATE 600+D PO) Take 1 tablet by mouth 3 (three) times daily.  . carbidopa-levodopa (SINEMET IR) 25-100 MG per tablet TAKE 1/2 TABLETS 3 TIMES A DAY  . ibandronate (BONIVA) 150 MG tablet Take 1 tablet by mouth every 30 (thirty) days.  . Ibuprofen  (ADVIL) 200 MG CAPS Take 200 mg by mouth as needed.  . [DISCONTINUED] Magnesium 400 MG CAPS Take 1 tablet by mouth daily.  :  Review of Systems:  Out of a complete 14 point review of systems, all are reviewed and negative with the exception of these symptoms as listed below:   Review of Systems  Musculoskeletal: Positive for back pain.    Objective:  Neurologic Exam  Physical Exam Physical Examination:   Filed Vitals:   12/07/13 1237  BP: 126/76  Pulse: 69  Temp: 97.4 F (36.3 C)    General Examination: The  patient is a very pleasant 73 y.o. female in no acute distress.   HEENT: Normocephalic, atraumatic, pupils are equal, round and reactive to light and accommodation. Extraocular tracking shows mild saccadic breakdown without nystagmus noted. There is no limitation to her gaze. There is mild decrease in eye blink rate. Hearing is intact. Face is symmetric with mild facial masking and normal facial sensation. There is no lip, neck or jaw tremor. Neck is mildly rigid with intact passive ROM. There are no carotid bruits on auscultation. Oropharynx exam reveals moderate mouth dryness. No significant airway crowding is noted. Mallampati is class II. Tongue protrudes centrally and palate elevates symmetrically.    Chest: is clear to auscultation without wheezing, rhonchi or crackles noted.  Heart: sounds are regular and normal without murmurs, rubs or gallops noted.   Abdomen: is soft, non-tender and non-distended with normal bowel sounds appreciated on auscultation.  Extremities: There is no pitting edema in the distal lower extremities bilaterally. Pedal pulses are intact.  Skin: is warm and dry with no trophic changes noted.  Musculoskeletal: exam reveals no obvious joint deformities, tenderness or joint swelling or erythema, but there is mild crepitation in both knees.  Neurologically:  Mental status: The patient is awake and alert, paying good  attention. She is able to  provide her history well. She is oriented to: person, place, time/date, situation, day of week, month of year and year. Her memory, attention, language and knowledge are intact. There is no aphasia, agnosia, apraxia or anomia. There is no degree of bradyphrenia. Speech is mildly hypophonic with no dysarthria noted. Mood is congruent and affect is  normal. Cranial nerves are as described above under HEENT exam. In addition, shoulder shrug is normal with unequal shoulder height noted, R side lower than L, unchanged.  Motor exam: Normal bulk, and strength for age is noted. Tone is mildly rigid with presence of cogwheeling in the RUE. There is overall mild bradykinesia. There is no drift or rebound. Romberg is negative. Reflexes are 1+ in the upper extremities and 1+ in the lower extremities. Fine motor skills exam reveals: Finger taps are mildly impaired on the right and normal impaired on the left. Hand movements are mildly impaired on the right and minimally impaired on the left. RAP (rapid alternating patting) is mildly impaired on the right and normal impaired on the left. Foot taps are mildly impaired on the right and mildly impaired on the left. Foot agility (in the form of heel stomping) is mildly impaired on the right and mildly impaired on the left.    Cerebellar testing shows no dysmetria or intention tremor on finger to nose testing. There is no truncal or gait ataxia.   Sensory exam is intact to light touch.    Gait, station and balance exam: She stands up from the seated position with no difficulty. No veering to one side is noted. She is not noted to lean. Posture is mildly stooped. Stance is narrow-based. She walks with decrease in stride length but good pace and nearly absent arm swing on the right. She turns in 2 steps. Balance is not impaired.   Assessment and Plan:    In summary, Jasmine Colon is a very pleasant 73 year old female with a history of PD, right predominant and overall mild  and stable findings. She is doing well at this time and I suggested she continue with the current medication including as 1 mg once daily and Sinemet 25-100 half a pill 3 times  a day. She's advised to take the levodopa away from her meals for better absorption and she is advised to not skip her morning meal. She's advised to drink more water and to quit smoking. She is furthermore advised to increase her exercise regimen.  I encouraged the patient to eat healthy, exercise daily and keep well hydrated, to keep a scheduled bedtime and wake time routine, to not skip any meals and eat healthy snacks in between meals and to have protein with every meal.  I answered all their questions today and the patient was in agreement with the plan. I would like to see her back in 6 months, sooner if the need arises and encouraged them to call with any interim questions, concerns, problems or updates and refill requests.

## 2013-12-07 NOTE — Patient Instructions (Addendum)
We will continue with the current medication.  Please stop smoking! Drink more water.  Start exercising more regularly! Take your sinemet without food for better absorption and do not skip meals.

## 2013-12-21 ENCOUNTER — Other Ambulatory Visit: Payer: Self-pay | Admitting: Neurology

## 2013-12-21 ENCOUNTER — Telehealth: Payer: Self-pay | Admitting: Neurology

## 2013-12-21 NOTE — Telephone Encounter (Signed)
Patient has been taking these medications for over 1 year per notes in [place].  I called back.  Spoke with Gerald Stabs.  Says after he called he looked back at patient history and saw she had been taking these meds.  Says we can disregard call and nothing further is needed.

## 2013-12-21 NOTE — Telephone Encounter (Signed)
Chris Pharmacist @ [facility] @ [phone], confirming if its ok for pt to take both carbidopa-levodopa (SINEMET IR) 25-100 MG per tablet and rasagiline (AZILECT) 1 MG TABS tablet? Please call and advise.

## 2014-01-05 LAB — CANCER ANTIGEN 27.29: CA 27.29: 39.8 U/mL — ABNORMAL HIGH (ref 0.0–38.6)

## 2014-01-05 NOTE — Progress Notes (Signed)
Quick Note:  Inform pt labs are normal. F/u as scheduled ______ 

## 2014-01-08 ENCOUNTER — Telehealth: Payer: Self-pay | Admitting: *Deleted

## 2014-01-08 NOTE — Telephone Encounter (Signed)
Message copied by Chrystie Nose on Mon Jan 08, 2014  8:16 AM ------      Message from: Christene Lye      Created: Fri Jan 05, 2014  2:40 PM       Inform pt labs are normal. F/u as scheduled ------

## 2014-01-08 NOTE — Telephone Encounter (Signed)
Notified patient as instructed, patient pleased. Discussed follow-up appointments, patient agrees  

## 2014-01-08 NOTE — Telephone Encounter (Signed)
Pt returned phone call, advised pt that lab work was normal and that she is to follow up as scheduled. Pt verbalized her understanding.

## 2014-01-09 ENCOUNTER — Encounter: Payer: Self-pay | Admitting: General Surgery

## 2014-01-09 ENCOUNTER — Other Ambulatory Visit: Payer: Medicare HMO

## 2014-01-09 ENCOUNTER — Ambulatory Visit (INDEPENDENT_AMBULATORY_CARE_PROVIDER_SITE_OTHER): Payer: Medicare HMO | Admitting: General Surgery

## 2014-01-09 VITALS — BP 118/62 | HR 74 | Resp 12 | Ht 65.0 in | Wt 127.0 lb

## 2014-01-09 DIAGNOSIS — R928 Other abnormal and inconclusive findings on diagnostic imaging of breast: Secondary | ICD-10-CM

## 2014-01-09 HISTORY — PX: BREAST BIOPSY: SHX20

## 2014-01-09 NOTE — Patient Instructions (Addendum)
CARE AFTER BREAST BIOPSY  1. Leave the dressing on that your doctor applied after surgery. It is waterproof. You may bathe, shower and/or swim. The dressing will probably remain intact until your return office visit. If the dressing comes off, you will see small strips of tape against your skin on the incision. Do not remove these strips.  2. You may want to use a gauze,cloth or similar protection in your bra to prevent rubbing against your dressing and incision. This is not necessary, but you may feel more comfortable doing so.  3. It is recommended that you wear a bra day and night to give support to the breast. This will prevent the weight of the breast from pulling on the incision.  4. Your breast will feel hard and lumpy under the incision. Do not be alarmed. This is the underlying stitching of tissue. Softening of this tissue will occur in time.  5. Make sure you call the office and schedule an appointment in one week after your surgery. The office phone number is [phone]. The nurses at [facility] may have already done this for you.  6. You will notice about a week after your office visit that the strips of the tape on your incision will begin to loosen. These may then be removed.  7. Report to your doctor any of the following:  * Severe pain not relieved by your pain medication  *Redness of the incision  * Drainage from the incision  *Fever greater than 101 degrees  Patient is scheduled for a Unilateral Left Diagnostic Mammogram at [facility] on 02/09/14 at 11:00 am. The patient is aware of date and time.

## 2014-01-09 NOTE — Progress Notes (Signed)
Patient ID: Jasmine Colon, female   DOB: [birth year], 73 y.o.   MRN: [id number]  Chief Complaint  Patient presents with  . Follow-up    mammogram    HPI Jasmine Colon is a 73 y.o. female who presents for a breast cancer follow up. The most recent mammogram was done on 01/01/14.Marland Kitchen  Patient does perform regular self breast checks and gets regular mammograms done.    HPI  Past Medical History  Diagnosis Date  . Parkinson's disease   . Adenocarcinoma     right breast  . Lower back pain   . Osteoporosis   . Lesion of lateral popliteal nerve   . Hyperlipidemia   . Mitral valve disorder   . Osteoarthritis   . Osteoporosis   . Asthma 2012  . Parkinson's disease 2012  . Personal history of malignant neoplasm of breast   . Malignant neoplasm of upper-outer quadrant of female breast 2013    T1,N0,M0. Hormone pos, Her 2 no amplified.     Past Surgical History  Procedure Laterality Date  . Breast surgery Right 2013    wide excision  . Colonoscopy  2008  . Cesarean section  31 years ago    Family History  Problem Relation Age of Onset  . Heart disease Brother     bypass  . Stroke Mother   . Heart failure Father   . Cancer Maternal Aunt     breast   . Cancer Maternal Aunt     breast    Social History History  Substance Use Topics  . Smoking status: Current Every Day Smoker -- 0.50 packs/day for 45 years    Types: Cigarettes  . Smokeless tobacco: Never Used  . Alcohol Use: No    Allergies  Allergen Reactions  . Codeine     Feels weird    Current Outpatient Prescriptions  Medication Sig Dispense Refill  . anastrozole (ARIMIDEX) 1 MG tablet TAKE 1 TABLET BY MOUTH EVERY DAY  30 tablet  10  . aspirin 81 MG tablet Take 81 mg by mouth daily.      . Calcium Carbonate-Vitamin D (CALTRATE 600+D PO) Take 1 tablet by mouth 3 (three) times daily.      . carbidopa-levodopa (SINEMET IR) 25-100 MG per tablet TAKE 0.5 TABLETS BY MOUTH 3 (THREE) TIMES DAILY.  135 tablet  3   . ibandronate (BONIVA) 150 MG tablet Take 1 tablet by mouth every 30 (thirty) days.      . Ibuprofen (ADVIL) 200 MG CAPS Take 200 mg by mouth as needed.      . rasagiline (AZILECT) 1 MG TABS tablet Take 1 tablet (1 mg total) by mouth daily.  90 tablet  3   No current facility-administered medications for this visit.    Review of Systems Review of Systems  Constitutional: Negative.   Respiratory: Negative.   Cardiovascular: Negative.     Blood pressure 118/62, pulse 74, resp. rate 12, height 5\' 5"  (1.651 m), weight 127 lb (57.607 kg).  Physical Exam Physical Exam  Constitutional: She is oriented to person, place, and time. She appears well-developed and well-nourished.  Eyes: No scleral icterus.  Cardiovascular: Normal rate, regular rhythm and normal heart sounds.   Pulmonary/Chest: Breath sounds normal. Right breast exhibits no inverted nipple, no mass, no nipple discharge, no skin change and no tenderness. Left breast exhibits no inverted nipple, no mass, no nipple discharge, no skin change and no tenderness.  Abdominal: Soft. Bowel sounds are normal.  There is no hepatomegaly. There is no tenderness. A hernia is present.  Lymphadenopathy:    She has no cervical adenopathy.    She has no axillary adenopathy.  Neurological: She is alert and oriented to person, place, and time.  Skin: Skin is warm and dry.    Data Reviewed Mammogram shows 5 mm new area density   retroareolar left sessment   27yrs post right breast lumpectomy, sn biopsy and radiation. Now on Arimidex. Performed left breast ultrasound- showed a 4 mm oval shaped hypoechoic mass. Core biopsy done and mass resolved after one pass.    Plan    Patient to return in 31yr with bil diagnostic mammogram assuming the left mammogram in 1 mo shows no mass     Patient is scheduled for a Unilateral Left Diagnostic Mammogram at [facility] on 02/09/14 at 11:00 am. The patient is aware of date and time.    Zylon Creamer  G 01/09/2014, 9:31 PM

## 2014-01-10 ENCOUNTER — Encounter: Payer: Self-pay | Admitting: General Surgery

## 2014-01-12 LAB — PATHOLOGY

## 2014-01-16 ENCOUNTER — Telehealth: Payer: Self-pay | Admitting: *Deleted

## 2014-01-16 NOTE — Telephone Encounter (Signed)
Message copied by Carson Myrtle on Tue Jan 16, 2014 11:27 AM ------      Message from: Christene Lye      Created: Mon Jan 15, 2014  2:04 PM       Please let pt pt know the pathology was normal. F/u as scheduled ------

## 2014-01-16 NOTE — Telephone Encounter (Signed)
Notified patient as instructed, patient pleased. Discussed follow-up appointments, patient agrees  

## 2014-02-12 ENCOUNTER — Encounter: Payer: Self-pay | Admitting: General Surgery

## 2014-02-19 ENCOUNTER — Encounter: Payer: Self-pay | Admitting: General Surgery

## 2014-05-23 ENCOUNTER — Telehealth: Payer: Self-pay | Admitting: Neurology

## 2014-05-23 NOTE — Telephone Encounter (Signed)
Called and left Vm for patient to return call back to reschedule appointment from 06/12/14

## 2014-06-12 ENCOUNTER — Ambulatory Visit: Payer: Medicare HMO | Admitting: Neurology

## 2014-08-01 ENCOUNTER — Ambulatory Visit: Payer: Medicare HMO | Admitting: Neurology

## 2014-08-04 ENCOUNTER — Other Ambulatory Visit: Payer: Self-pay | Admitting: General Surgery

## 2014-08-07 NOTE — Consult Note (Signed)
Reason for Visit: This 74 year old Female patient presents to the clinic for initial evaluation of  Breast cancer .   Referred by Dr. Jamal Collin.  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 Underwent needle biopsy which was positive for invasive mammary carcinoma. She then underwent by Dr. Jamal Collin R. a wide local excision with sentinel node biopsy. Tumor was 0.7 cm invasive mammary carcinoma margins clear. 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 Tumor was overall grade 1. 2 sentinel lymph nodes were biopsied both negative for metastatic disease. Tumor was strongly ER positive weakly PR positive and HER-2/neu nonamplified. Patient tolerated her surgery well. She seen today for opinion regarding adjuvant radiation therapy. She is having no significant breast tenderness cough or bone pain at this time.  Past Hx:    Tobacco Use:    Parkinson's Disease:    Cancer, Breast:    Asthma:    Previous C/S:   Past, Family and Social History:   Past Medical History positive    Respiratory asthma    Neurological/Psychiatric Parkinson's disease    Family History noncontributory    Social History positive    Social History Comments 40-pack-year smoking history, no PET which abuse history    Additional Past Medical and Surgical History Accompanied by her husband today   Allergies:   Codeine:  Dizzy/Fainting  Pseudoephedrine: Dizzy/Fainting  Home Meds:  Home Medications: Medication Instructions Status  Aleve 220 mg oral tablet 2 tab(s) orally every 8 hours, As Needed Active  Caltrate 600 + D oral tablet 1 tab(s) orally 3 times a day Active  Azilect 1 mg oral tablet 1 tab(s) orally once a day Active  carbidopa-levodopa 25 mg-100 mg oral tablet 0.5  tab(s) orally 3 times a day Active  Aspirin Low Dose 81 mg oral tablet 1 tab(s) orally once a day Active   Review of Systems:   General negative    Performance Status (ECOG) 0    Skin negative    Breast see HPI    Ophthalmologic negative    ENMT negative    Respiratory and Thorax negative    Cardiovascular negative    Gastrointestinal negative    Genitourinary negative    Musculoskeletal negative    Neurological see HPI    Psychiatric negative    Hematology/Lymphatics negative    Endocrine negative    Allergic/Immunologic negative    Review of Systems   Patient denies any weight loss, fatigue, weakness, fever, chills or night sweats. Patient denies any loss of vision, blurred vision. Patient denies any ringing  of the ears or hearing loss. No irregular heartbeat. Patient denies heart murmur or history of fainting. Patient denies any chest pain or pain radiating to her upper extremities. Patient denies any shortness of breath, difficulty breathing at night, cough or hemoptysis. Patient denies any swelling in the lower legs. Patient denies any nausea vomiting, vomiting of blood, or coffee ground material in  the vomitus. Patient denies any stomach pain. Patient states has had normal bowel movements no significant constipation or diarrhea. Patient denies any dysuria, hematuria or significant nocturia. Patient denies any problems walking, swelling in the joints or loss of balance. Patient denies any skin changes, loss of hair or loss of weight. Patient denies any excessive worrying or anxiety or significant depression.  Patient denies any problems with insomnia. Patient denies excessive thirst, polyuria, polydipsia. Patient denies any swollen glands, patient denies easy bruising or easy bleeding. Patient denies any recent infections, allergies or URI. Patient "s visual fields have not changed significantly in recent time.  Nursing Notes:  Nursing Vital Signs and Chemo Nursing Nursing Notes: *CC Vital Signs Flowsheet:   12-Aug-13 11:23   Temp Temperature 98.4   Pulse Pulse 76   Respirations Respirations 20   SBP SBP 136   DBP DBP 83   Pain Scale (0-10)  1   Current Weight (kg) (kg) 57.4   Height (cm) centimeters 165.1   BSA (m2) 1.6   Physical Exam:  General/Skin/HEENT:   General normal    Skin normal    Eyes normal    ENMT normal    Head and Neck normal    Additional PE Well-developed well-nourished female in NAD. Lungs are clear to A&P. Right breast is a wide local excision which is well-healed. No dominant mass or nodularity is noted in either breast into position examined. No axillary or supraclavicular adenopathy is appreciated.   Breasts/Resp/CV/GI/GU:   Respiratory and Thorax normal    Cardiovascular normal    Gastrointestinal normal    Genitourinary normal   MS/Neuro/Psych/Lymph:   Musculoskeletal normal    Neurological normal    Lymphatics normal   Other Results:  Radiology Results: [place]:    30-May-12 08:37, Digital Screen Mammogram   Digital Screen Mammogram    REASON FOR EXAM:    scr  COMMENTS:       PROCEDURE: MAM - MAM DGTL SCREENING MAMMO W/CAD  - Sep 17 2010  8:37AM     RESULT: Comparison is made to prior studies dated 08/29/2009 and   08/25/2007.     The breasts demonstrate a mild heterogeneous parenchymal pattern. An area   of asymmetric, spiculated density is partially visualized along the deep   lateral portion of the left breast, primarily on the CC view. Further   evaluation with magnification compression imaging to include exaggerated    CC view imaging is recommended. There is no further radiographic evidence   to suggest malignancy.     IMPRESSION:     BI-RADS:  Category 0 - Needs Additional Imaging Evaluation     Thank you for this opportunity to contribute to the care of your patient.    A NEGATIVE MAMMOGRAM REPORT DOES NOT PRECLUDE BIOPSY OR OTHER EVALUATION   OF A CLINICALLY PALPABLE OR OTHERWISE SUSPICIOUS MASS OR LESION. BREAST   CANCER MAY NOT BE DETECTED BY MAMMOGRAPHY IN UP TO 10% OF CASES.           Verified By: Mikki Santee, M.D., MD   Assessment and Plan:  Impression:   stage I ER/PR positive invasive mammary carcinoma the right breast in 74 year old female status post wide local excision and sentinel node biopsy.  Plan:   I discussed the case personally with Dr. Emilee Hero. I am in favor of going ahead at this point with whole breast radiation. Based on the positive lobular carcinoma in situ margin we will add a  boost of 2000 cGy after completion of whole breast radiation to 5000 cGy to the right breast. Risks and benefits of treatment including redness and irritation of the skin, fatigue, and some inclusion of superficial lung her treatment fields were all explained in detail to the patient and her husband. I have set her up for CT simulation within the next week. I also discussed the case with Dr. Oliva Bustard and I will refer her to him after completion of radiation for discussion of aromatase inhibitor.  I would like to take this opportunity to thank you for allowing me to continue to participate in this patient's care.  CC Referral:   cc: Dr. Emilee Hero. Dr. Ronnald Collum   Electronic Signatures: Baruch Gouty, Roda Shutters (MD)  (Signed 12-Aug-13 16:59)  Authored: HPI, Diagnosis, Past Hx, PFSH, Allergies, Home Meds, ROS, Nursing Notes, Physical Exam, Other Results, Encounter Assessment and Plan, CC Referring Physician   Last Updated: 12-Aug-13 16:59 by Armstead Peaks (MD)

## 2014-08-07 NOTE — Op Note (Signed)
PATIENT NAME:  Jasmine Colon, SWOR MR#:  [id number] DATE OF BIRTH:  [birth year]  DATE OF PROCEDURE:  11/13/2011  PREOPERATIVE DIAGNOSIS: Carcinoma, right breast.   POSTOPERATIVE DIAGNOSIS: Carcinoma, right breast.   OPERATION: Lumpectomy right breast with sentinel node biopsy.   SURGEON: Mckinley Jewel, MD   ANESTHESIA: General.   COMPLICATIONS: None.   ESTIMATED BLOOD LOSS: Less than 25 mL.   DRAINS: None.   PROCEDURE: The patient underwent nuclear contrast injection and wire localization of the clip placed at the site of cancer in the right breast upper outer quadrant preoperatively. The patient was put to sleep in the supine position on the operating table. The right breast and axilla were prepped and draped out as a sterile field. Excess of the wire was trimmed and the gamma finder was utilized to locate signal activity which was in the mid to lower portion of the axilla in the central area. A skin incision was made overlying this about 2 cm and deepened through carefully into the axillary fat pad. Bleeding was controlled with cautery. Using the gamma finder two nodes were identified both with signal activity and these were then removed separately and sent off as sentinel node one and two. Signal activity was essentially nil after this removal. There were no other grossly visible or palpable nodes. The wound was then closed with 2-0 Vicryl in deep tissue and the skin with subcuticular 4-0 Vicryl. Lumpectomy was then performed. A skin incision was made along the upper outer quadrant from the wire entrance site going towards the central part of the breast. It was deepened through the breast and skin and subcutaneous tissue was then elevated on both sides. The wire entrance was used into the gland as a marker and circumferential excision was then performed to allow for a lumpectomy to be completed. The tissue in the course of dissection appeared perfectly normal. The specimen was tagged for margins and  then sent to x-ray confirming the presence of the clip within it and subsequently to pathology. Pathology report showed no gross metastatic disease in the lymph nodes and the margins at least on gross exam appeared to be clear with a very tiny biopsy cavity noted. The wound was closed in a similar fashion with 2-0 Vicryl in the deeper tissue and the skin with subcuticular 4-0 Vicryl. Both incisions were covered with Dermabond. The procedure was well tolerated. She was subsequently returned to the recovery room in stable condition.  ____________________________ S.Robinette Haines, MD sgs:drc D: 11/13/2011 11:06:11 ET T: 11/13/2011 11:46:05 ET JOB#: [id number]  cc: S.G. Jamal Collin, MD, <Dictator> [facility] Robinette Haines MD ELECTRONICALLY SIGNED 11/16/2011 7:59

## 2014-08-20 ENCOUNTER — Ambulatory Visit: Admission: RE | Admit: 2014-08-20 | Payer: Self-pay | Source: Ambulatory Visit | Admitting: Radiation Oncology

## 2014-11-07 ENCOUNTER — Other Ambulatory Visit: Payer: Self-pay

## 2014-11-07 DIAGNOSIS — C50411 Malignant neoplasm of upper-outer quadrant of right female breast: Secondary | ICD-10-CM

## 2015-01-15 ENCOUNTER — Ambulatory Visit (INDEPENDENT_AMBULATORY_CARE_PROVIDER_SITE_OTHER): Payer: PPO | Admitting: General Surgery

## 2015-01-15 ENCOUNTER — Encounter: Payer: Self-pay | Admitting: General Surgery

## 2015-01-15 VITALS — BP 118/72 | HR 78 | Resp 12 | Ht 65.0 in | Wt 130.0 lb

## 2015-01-15 DIAGNOSIS — Z853 Personal history of malignant neoplasm of breast: Secondary | ICD-10-CM | POA: Diagnosis not present

## 2015-01-15 NOTE — Patient Instructions (Signed)
Patient will be asked to return to the office in one year with a bilateral screening mammogram. 

## 2015-01-15 NOTE — Progress Notes (Signed)
Patient ID: Jasmine Colon, female   DOB: 06-30-1940, 74 y.o.   MRN: [id number]  Chief Complaint  Patient presents with  . Follow-up    mammogram    HPI Jasmine Colon is a 74 y.o. female who presents for a breast cancer follow up. The most recent mammogram was done on 01/09/15.   Patient does perform regular self breast checks and gets regular mammograms done.    She has experienced some back pain that radiates down the legs.  HPI  Past Medical History  Diagnosis Date  . Parkinson's disease   . Adenocarcinoma     right breast  . Lower back pain   . Osteoporosis   . Lesion of lateral popliteal nerve   . Hyperlipidemia   . Mitral valve disorder   . Osteoarthritis   . Osteoporosis   . Asthma 2012  . Parkinson's disease 2012  . Malignant neoplasm of upper-outer quadrant of female breast 2013    T1,N0,M0. Hormone pos, Her 2 no amplified.   . Breast cancer     Past Surgical History  Procedure Laterality Date  . Breast surgery Right 2013    wide excision  . Colonoscopy  2008  . Cesarean section  31 years ago    Family History  Problem Relation Age of Onset  . Heart disease Brother     bypass  . Stroke Mother   . Heart failure Father   . Cancer Maternal Aunt     breast   . Cancer Maternal Aunt     breast    Social History Social History  Substance Use Topics  . Smoking status: Current Every Day Smoker -- 0.50 packs/day for 45 years    Types: Cigarettes  . Smokeless tobacco: Never Used  . Alcohol Use: No    Allergies  Allergen Reactions  . Codeine     Feels weird  . Pseudoephedrine     Other reaction(s): Dizziness and giddiness (finding)    Current Outpatient Prescriptions  Medication Sig Dispense Refill  . anastrozole (ARIMIDEX) 1 MG tablet TAKE 1 TABLET BY MOUTH EVERY DAY 30 tablet 6  . Calcium Carbonate-Vitamin D (CALTRATE 600+D PO) Take 3 tablets by mouth 3 (three) times daily.     . carbidopa-levodopa (SINEMET IR) 25-100 MG per tablet TAKE 0.5  TABLETS BY MOUTH 3 (THREE) TIMES DAILY. 135 tablet 3  . ibandronate (BONIVA) 150 MG tablet Take 1 tablet by mouth every 30 (thirty) days.    . Ibuprofen (ADVIL) 200 MG CAPS Take 200 mg by mouth as needed.    . Multiple Vitamins-Minerals (PRESERVISION AREDS PO) Take by mouth daily.     No current facility-administered medications for this visit.    Review of Systems Review of Systems  Constitutional: Negative.   Respiratory: Negative.   Cardiovascular: Negative.     Blood pressure 118/72, pulse 78, resp. rate 12, height 5\' 5"  (1.651 m), weight 130 lb (58.968 kg).  Physical Exam Physical Exam  Constitutional: She is oriented to person, place, and time. She appears well-developed and well-nourished.  Eyes: Conjunctivae are normal. No scleral icterus.  Neck: Neck supple.  Cardiovascular: Normal rate, regular rhythm and normal heart sounds.   Pulmonary/Chest: Effort normal and breath sounds normal. Right breast exhibits no inverted nipple, no mass, no nipple discharge, no skin change and no tenderness. Left breast exhibits no inverted nipple, no mass, no nipple discharge, no skin change and no tenderness.  Abdominal: Soft. Normal appearance and bowel sounds are  normal. There is no hepatomegaly. There is no tenderness.  Lymphadenopathy:    She has no cervical adenopathy.    She has no axillary adenopathy.  Neurological: She is alert and oriented to person, place, and time.  Skin: Skin is warm and dry.    Data Reviewed Mammogram reviewed  Assessment    Stable exam, CA right breast stage I. Currently on Irimidex and doing well.   78yrs post right breast lumpectomy, sn biopsy and radiation.   Back pain    Plan    Patient will be asked to return to the office in one year with a bilateral screening mammogram. Patient advised to see orthopedics, rheumatology, or PCP for evaluation of back pain.     PCP: Theodis Blaze 01/15/2015, 2:49 PM

## 2015-03-05 ENCOUNTER — Other Ambulatory Visit: Payer: Self-pay | Admitting: General Surgery

## 2015-05-10 DIAGNOSIS — G20 Parkinson's disease: Secondary | ICD-10-CM | POA: Diagnosis not present

## 2015-08-21 ENCOUNTER — Telehealth: Payer: Self-pay | Admitting: Neurology

## 2015-08-21 NOTE — Telephone Encounter (Signed)
Call pt to make appt. Pt has changed providers. States she would like to come back but will stay where she is for a little while longer to see how it goes.

## 2015-08-23 DIAGNOSIS — H2513 Age-related nuclear cataract, bilateral: Secondary | ICD-10-CM | POA: Diagnosis not present

## 2015-08-23 DIAGNOSIS — H353131 Nonexudative age-related macular degeneration, bilateral, early dry stage: Secondary | ICD-10-CM | POA: Diagnosis not present

## 2015-09-23 DIAGNOSIS — C50919 Malignant neoplasm of unspecified site of unspecified female breast: Secondary | ICD-10-CM | POA: Diagnosis not present

## 2015-09-23 DIAGNOSIS — E785 Hyperlipidemia, unspecified: Secondary | ICD-10-CM | POA: Diagnosis not present

## 2015-09-23 DIAGNOSIS — M810 Age-related osteoporosis without current pathological fracture: Secondary | ICD-10-CM | POA: Diagnosis not present

## 2015-09-23 DIAGNOSIS — G20 Parkinson's disease: Secondary | ICD-10-CM | POA: Diagnosis not present

## 2015-09-26 ENCOUNTER — Other Ambulatory Visit: Payer: Self-pay | Admitting: General Surgery

## 2015-09-26 DIAGNOSIS — M810 Age-related osteoporosis without current pathological fracture: Secondary | ICD-10-CM | POA: Diagnosis not present

## 2015-09-26 DIAGNOSIS — G20 Parkinson's disease: Secondary | ICD-10-CM | POA: Diagnosis not present

## 2015-09-26 DIAGNOSIS — I340 Nonrheumatic mitral (valve) insufficiency: Secondary | ICD-10-CM | POA: Diagnosis not present

## 2015-09-26 DIAGNOSIS — C50919 Malignant neoplasm of unspecified site of unspecified female breast: Secondary | ICD-10-CM | POA: Diagnosis not present

## 2015-09-26 DIAGNOSIS — F17200 Nicotine dependence, unspecified, uncomplicated: Secondary | ICD-10-CM | POA: Diagnosis not present

## 2015-09-26 DIAGNOSIS — E785 Hyperlipidemia, unspecified: Secondary | ICD-10-CM | POA: Diagnosis not present

## 2015-09-26 DIAGNOSIS — J189 Pneumonia, unspecified organism: Secondary | ICD-10-CM | POA: Diagnosis not present

## 2015-09-26 DIAGNOSIS — F17210 Nicotine dependence, cigarettes, uncomplicated: Secondary | ICD-10-CM | POA: Diagnosis not present

## 2015-09-26 DIAGNOSIS — J45998 Other asthma: Secondary | ICD-10-CM | POA: Diagnosis not present

## 2015-09-26 DIAGNOSIS — M1990 Unspecified osteoarthritis, unspecified site: Secondary | ICD-10-CM | POA: Diagnosis not present

## 2015-10-30 DIAGNOSIS — Z1211 Encounter for screening for malignant neoplasm of colon: Secondary | ICD-10-CM | POA: Diagnosis not present

## 2015-10-30 DIAGNOSIS — J189 Pneumonia, unspecified organism: Secondary | ICD-10-CM | POA: Diagnosis not present

## 2015-10-30 DIAGNOSIS — Z0000 Encounter for general adult medical examination without abnormal findings: Secondary | ICD-10-CM | POA: Diagnosis not present

## 2015-10-30 DIAGNOSIS — Z1212 Encounter for screening for malignant neoplasm of rectum: Secondary | ICD-10-CM | POA: Diagnosis not present

## 2015-10-30 DIAGNOSIS — F17210 Nicotine dependence, cigarettes, uncomplicated: Secondary | ICD-10-CM | POA: Diagnosis not present

## 2015-10-30 DIAGNOSIS — E785 Hyperlipidemia, unspecified: Secondary | ICD-10-CM | POA: Diagnosis not present

## 2015-10-30 DIAGNOSIS — M1990 Unspecified osteoarthritis, unspecified site: Secondary | ICD-10-CM | POA: Diagnosis not present

## 2015-10-30 DIAGNOSIS — I340 Nonrheumatic mitral (valve) insufficiency: Secondary | ICD-10-CM | POA: Diagnosis not present

## 2015-10-30 DIAGNOSIS — M810 Age-related osteoporosis without current pathological fracture: Secondary | ICD-10-CM | POA: Diagnosis not present

## 2015-10-30 DIAGNOSIS — J45998 Other asthma: Secondary | ICD-10-CM | POA: Diagnosis not present

## 2015-11-07 DIAGNOSIS — C50919 Malignant neoplasm of unspecified site of unspecified female breast: Secondary | ICD-10-CM | POA: Diagnosis not present

## 2015-11-07 DIAGNOSIS — I340 Nonrheumatic mitral (valve) insufficiency: Secondary | ICD-10-CM | POA: Diagnosis not present

## 2015-11-07 DIAGNOSIS — G20 Parkinson's disease: Secondary | ICD-10-CM | POA: Diagnosis not present

## 2015-11-07 DIAGNOSIS — F17210 Nicotine dependence, cigarettes, uncomplicated: Secondary | ICD-10-CM | POA: Diagnosis not present

## 2015-11-07 DIAGNOSIS — M810 Age-related osteoporosis without current pathological fracture: Secondary | ICD-10-CM | POA: Diagnosis not present

## 2015-11-07 DIAGNOSIS — M1990 Unspecified osteoarthritis, unspecified site: Secondary | ICD-10-CM | POA: Diagnosis not present

## 2015-11-07 DIAGNOSIS — J189 Pneumonia, unspecified organism: Secondary | ICD-10-CM | POA: Diagnosis not present

## 2015-11-07 DIAGNOSIS — J45998 Other asthma: Secondary | ICD-10-CM | POA: Diagnosis not present

## 2015-11-07 DIAGNOSIS — E785 Hyperlipidemia, unspecified: Secondary | ICD-10-CM | POA: Diagnosis not present

## 2015-11-15 DIAGNOSIS — F17210 Nicotine dependence, cigarettes, uncomplicated: Secondary | ICD-10-CM | POA: Diagnosis not present

## 2015-11-15 DIAGNOSIS — J189 Pneumonia, unspecified organism: Secondary | ICD-10-CM | POA: Diagnosis not present

## 2015-11-15 DIAGNOSIS — I340 Nonrheumatic mitral (valve) insufficiency: Secondary | ICD-10-CM | POA: Diagnosis not present

## 2015-11-15 DIAGNOSIS — E785 Hyperlipidemia, unspecified: Secondary | ICD-10-CM | POA: Diagnosis not present

## 2015-11-15 DIAGNOSIS — M810 Age-related osteoporosis without current pathological fracture: Secondary | ICD-10-CM | POA: Diagnosis not present

## 2015-11-15 DIAGNOSIS — C50919 Malignant neoplasm of unspecified site of unspecified female breast: Secondary | ICD-10-CM | POA: Diagnosis not present

## 2015-11-15 DIAGNOSIS — J45998 Other asthma: Secondary | ICD-10-CM | POA: Diagnosis not present

## 2015-11-15 DIAGNOSIS — M1990 Unspecified osteoarthritis, unspecified site: Secondary | ICD-10-CM | POA: Diagnosis not present

## 2015-11-15 DIAGNOSIS — G20 Parkinson's disease: Secondary | ICD-10-CM | POA: Diagnosis not present

## 2015-11-27 ENCOUNTER — Other Ambulatory Visit: Payer: Self-pay | Admitting: General Surgery

## 2015-11-27 DIAGNOSIS — C50411 Malignant neoplasm of upper-outer quadrant of right female breast: Secondary | ICD-10-CM

## 2015-12-02 ENCOUNTER — Other Ambulatory Visit: Payer: Self-pay | Admitting: *Deleted

## 2015-12-02 ENCOUNTER — Inpatient Hospital Stay
Admission: RE | Admit: 2015-12-02 | Discharge: 2015-12-02 | Disposition: A | Discharge status: Home / Self Care | Payer: Self-pay | Source: Ambulatory Visit | Attending: *Deleted | Admitting: *Deleted

## 2015-12-02 DIAGNOSIS — Z9289 Personal history of other medical treatment: Secondary | ICD-10-CM

## 2015-12-03 ENCOUNTER — Encounter: Payer: Self-pay | Admitting: *Deleted

## 2016-01-06 ENCOUNTER — Other Ambulatory Visit: Payer: Self-pay | Admitting: General Surgery

## 2016-01-06 ENCOUNTER — Ambulatory Visit
Admission: RE | Admit: 2016-01-06 | Discharge: 2016-01-06 | Disposition: A | Discharge status: Home / Self Care | Payer: PPO | Source: Ambulatory Visit | Attending: General Surgery | Admitting: General Surgery

## 2016-01-06 ENCOUNTER — Encounter: Payer: Self-pay | Admitting: *Deleted

## 2016-01-06 DIAGNOSIS — C50411 Malignant neoplasm of upper-outer quadrant of right female breast: Secondary | ICD-10-CM

## 2016-01-06 DIAGNOSIS — Z853 Personal history of malignant neoplasm of breast: Secondary | ICD-10-CM | POA: Diagnosis not present

## 2016-01-06 DIAGNOSIS — N6489 Other specified disorders of breast: Secondary | ICD-10-CM | POA: Diagnosis not present

## 2016-01-06 DIAGNOSIS — R928 Other abnormal and inconclusive findings on diagnostic imaging of breast: Secondary | ICD-10-CM | POA: Diagnosis not present

## 2016-01-06 HISTORY — DX: Personal history of irradiation: Z92.3

## 2016-01-09 DIAGNOSIS — G20 Parkinson's disease: Secondary | ICD-10-CM | POA: Diagnosis not present

## 2016-01-13 ENCOUNTER — Encounter: Payer: Self-pay | Admitting: General Surgery

## 2016-01-13 ENCOUNTER — Ambulatory Visit: Payer: PPO | Admitting: General Surgery

## 2016-01-13 VITALS — BP 122/78 | HR 74 | Resp 12 | Ht 66.0 in | Wt 125.0 lb

## 2016-01-13 DIAGNOSIS — Z853 Personal history of malignant neoplasm of breast: Secondary | ICD-10-CM | POA: Diagnosis not present

## 2016-01-13 DIAGNOSIS — C50919 Malignant neoplasm of unspecified site of unspecified female breast: Secondary | ICD-10-CM | POA: Insufficient documentation

## 2016-01-13 DIAGNOSIS — C50411 Malignant neoplasm of upper-outer quadrant of right female breast: Secondary | ICD-10-CM | POA: Diagnosis not present

## 2016-01-13 NOTE — Progress Notes (Signed)
Patient ID: Jasmine Colon, female   DOB: 1941-02-09, 75 y.o.   MRN: [id number]  Chief Complaint  Patient presents with  . Follow-up    mammogram    HPI Jasmine Colon is a 75 y.o. female who presents for a breast cancer follow up. The most recent mammogram was done on 01/06/16. Patient does perform regular self breast checks and gets regular mammograms done.  Reports finding a small pea-sized mass to the upper outer quadrant of her right breast on self exam.  I have reviewed the history of present illness with the patient.  HPI  Past Medical History:  Diagnosis Date  . Adenocarcinoma ([place])    right breast  . Asthma 2012  . Hyperlipidemia   . Lesion of lateral popliteal nerve   . Lower back pain   . Malignant neoplasm of upper-outer quadrant of female breast ([place]) 2013   T1,N0,M0. Hormone pos, Her 2 no amplified.   . Mitral valve disorder   . Osteoarthritis   . Osteoporosis   . Osteoporosis   . Parkinson's disease ([place]) 2012  . Personal history of radiation therapy     Past Surgical History:  Procedure Laterality Date  . BREAST BIOPSY Right 2013   +  . BREAST BIOPSY Left 2016   neg  . BREAST SURGERY Right 2013   wide excision  . CESAREAN SECTION  31 years ago  . COLONOSCOPY  2008    Family History  Problem Relation Age of Onset  . Stroke Mother   . Heart failure Father   . Heart disease Brother     bypass  . Cancer Maternal Aunt     breast   . Breast cancer Maternal Aunt   . Cancer Maternal Aunt     breast  . Breast cancer Maternal Aunt     Social History Social History  Substance Use Topics  . Smoking status: Current Every Day Smoker    Packs/day: 0.50    Years: 45.00    Types: Cigarettes  . Smokeless tobacco: Never Used  . Alcohol use No    Allergies  Allergen Reactions  . Codeine     Feels weird  . Pseudoephedrine     Other reaction(s): Dizziness and giddiness (finding)    Current Outpatient Prescriptions  Medication Sig Dispense  Refill  . anastrozole (ARIMIDEX) 1 MG tablet TAKE 1 TABLET BY MOUTH EVERY DAY 30 tablet 6  . Calcium Carbonate-Vitamin D (CALTRATE 600+D PO) Take 3 tablets by mouth 3 (three) times daily.     . carbidopa-levodopa (SINEMET IR) 25-100 MG per tablet TAKE 0.5 TABLETS BY MOUTH 3 (THREE) TIMES DAILY. 135 tablet 3  . ibandronate (BONIVA) 150 MG tablet Take 1 tablet by mouth every 30 (thirty) days.    . Ibuprofen (ADVIL) 200 MG CAPS Take 200 mg by mouth as needed.    . Multiple Vitamins-Minerals (PRESERVISION AREDS PO) Take by mouth daily.     No current facility-administered medications for this visit.     Review of Systems Review of Systems  Constitutional: Negative.   Respiratory: Negative.   Cardiovascular: Negative.     Blood pressure 122/78, pulse 74, resp. rate 12, height 5\' 6"  (1.676 m), weight 125 lb (56.7 kg).  Physical Exam Physical Exam  Constitutional: She is oriented to person, place, and time. She appears well-developed and well-nourished.  Eyes: Conjunctivae are normal. No scleral icterus.  Neck: Neck supple.  Cardiovascular: Normal rate, regular rhythm and normal heart sounds.  Pulmonary/Chest: Effort normal and breath sounds normal. Right breast exhibits no inverted nipple, no mass, no nipple discharge, no skin change and no tenderness. Left breast exhibits no inverted nipple, no mass, no nipple discharge, no skin change and no tenderness.    Abdominal: Soft. Bowel sounds are normal. There is no tenderness.  Lymphadenopathy:    She has no cervical adenopathy.    She has no axillary adenopathy.  Neurological: She is alert and oriented to person, place, and time.  Skin: Skin is warm and dry.    Data Reviewed Mammogram reviewed   Assessment    Stable exam. 48yrs post right breast lumpectomy/radiation for stage 1 cancer.     Plan    1 year mammogram follow up. Continue taking anastrozole.   This information has been scribed by Gaspar Cola  CMA. SANKAR,SEEPLAPUTHUR G 01/13/2016, 2:30 PM

## 2016-01-14 LAB — CANCER ANTIGEN 27.29: CA 27.29: 41.3 U/mL — AB (ref 0.0–38.6)

## 2016-01-14 LAB — CEA: CEA: 4.8 ng/mL — ABNORMAL HIGH (ref 0.0–4.7)

## 2016-02-21 DIAGNOSIS — H2513 Age-related nuclear cataract, bilateral: Secondary | ICD-10-CM | POA: Diagnosis not present

## 2016-02-21 DIAGNOSIS — H353131 Nonexudative age-related macular degeneration, bilateral, early dry stage: Secondary | ICD-10-CM | POA: Diagnosis not present

## 2016-02-21 DIAGNOSIS — H1045 Other chronic allergic conjunctivitis: Secondary | ICD-10-CM | POA: Diagnosis not present

## 2016-03-24 DIAGNOSIS — M810 Age-related osteoporosis without current pathological fracture: Secondary | ICD-10-CM | POA: Diagnosis not present

## 2016-03-24 DIAGNOSIS — C50919 Malignant neoplasm of unspecified site of unspecified female breast: Secondary | ICD-10-CM | POA: Diagnosis not present

## 2016-03-26 DIAGNOSIS — C50919 Malignant neoplasm of unspecified site of unspecified female breast: Secondary | ICD-10-CM | POA: Diagnosis not present

## 2016-03-26 DIAGNOSIS — J45998 Other asthma: Secondary | ICD-10-CM | POA: Diagnosis not present

## 2016-03-26 DIAGNOSIS — E785 Hyperlipidemia, unspecified: Secondary | ICD-10-CM | POA: Diagnosis not present

## 2016-03-26 DIAGNOSIS — M810 Age-related osteoporosis without current pathological fracture: Secondary | ICD-10-CM | POA: Diagnosis not present

## 2016-03-26 DIAGNOSIS — M1990 Unspecified osteoarthritis, unspecified site: Secondary | ICD-10-CM | POA: Diagnosis not present

## 2016-03-26 DIAGNOSIS — G20 Parkinson's disease: Secondary | ICD-10-CM | POA: Diagnosis not present

## 2016-03-26 DIAGNOSIS — F17210 Nicotine dependence, cigarettes, uncomplicated: Secondary | ICD-10-CM | POA: Diagnosis not present

## 2016-03-26 DIAGNOSIS — I340 Nonrheumatic mitral (valve) insufficiency: Secondary | ICD-10-CM | POA: Diagnosis not present

## 2016-03-30 DIAGNOSIS — R0989 Other specified symptoms and signs involving the circulatory and respiratory systems: Secondary | ICD-10-CM | POA: Diagnosis not present

## 2016-03-30 DIAGNOSIS — J45998 Other asthma: Secondary | ICD-10-CM | POA: Diagnosis not present

## 2016-03-30 DIAGNOSIS — M810 Age-related osteoporosis without current pathological fracture: Secondary | ICD-10-CM | POA: Diagnosis not present

## 2016-03-30 DIAGNOSIS — M1990 Unspecified osteoarthritis, unspecified site: Secondary | ICD-10-CM | POA: Diagnosis not present

## 2016-03-30 DIAGNOSIS — R112 Nausea with vomiting, unspecified: Secondary | ICD-10-CM | POA: Diagnosis not present

## 2016-03-30 DIAGNOSIS — F17210 Nicotine dependence, cigarettes, uncomplicated: Secondary | ICD-10-CM | POA: Diagnosis not present

## 2016-03-30 DIAGNOSIS — C50919 Malignant neoplasm of unspecified site of unspecified female breast: Secondary | ICD-10-CM | POA: Diagnosis not present

## 2016-03-30 DIAGNOSIS — E785 Hyperlipidemia, unspecified: Secondary | ICD-10-CM | POA: Diagnosis not present

## 2016-03-30 DIAGNOSIS — H6501 Acute serous otitis media, right ear: Secondary | ICD-10-CM | POA: Diagnosis not present

## 2016-03-30 DIAGNOSIS — G20 Parkinson's disease: Secondary | ICD-10-CM | POA: Diagnosis not present

## 2016-03-30 DIAGNOSIS — J45901 Unspecified asthma with (acute) exacerbation: Secondary | ICD-10-CM | POA: Diagnosis not present

## 2016-03-30 DIAGNOSIS — H8149 Vertigo of central origin, unspecified ear: Secondary | ICD-10-CM | POA: Diagnosis not present

## 2016-04-07 ENCOUNTER — Other Ambulatory Visit: Payer: Self-pay | Admitting: General Surgery

## 2016-09-08 DIAGNOSIS — H1045 Other chronic allergic conjunctivitis: Secondary | ICD-10-CM | POA: Diagnosis not present

## 2016-09-08 DIAGNOSIS — H2513 Age-related nuclear cataract, bilateral: Secondary | ICD-10-CM | POA: Diagnosis not present

## 2016-09-08 DIAGNOSIS — H353131 Nonexudative age-related macular degeneration, bilateral, early dry stage: Secondary | ICD-10-CM | POA: Diagnosis not present

## 2016-09-10 DIAGNOSIS — G20 Parkinson's disease: Secondary | ICD-10-CM | POA: Diagnosis not present

## 2016-09-17 DIAGNOSIS — E785 Hyperlipidemia, unspecified: Secondary | ICD-10-CM | POA: Diagnosis not present

## 2016-09-17 DIAGNOSIS — M810 Age-related osteoporosis without current pathological fracture: Secondary | ICD-10-CM | POA: Diagnosis not present

## 2016-09-17 DIAGNOSIS — G20 Parkinson's disease: Secondary | ICD-10-CM | POA: Diagnosis not present

## 2016-09-17 DIAGNOSIS — C50919 Malignant neoplasm of unspecified site of unspecified female breast: Secondary | ICD-10-CM | POA: Diagnosis not present

## 2016-10-05 DIAGNOSIS — H81399 Other peripheral vertigo, unspecified ear: Secondary | ICD-10-CM | POA: Diagnosis not present

## 2016-10-05 DIAGNOSIS — E785 Hyperlipidemia, unspecified: Secondary | ICD-10-CM | POA: Diagnosis not present

## 2016-10-05 DIAGNOSIS — L84 Corns and callosities: Secondary | ICD-10-CM | POA: Diagnosis not present

## 2016-10-05 DIAGNOSIS — M1990 Unspecified osteoarthritis, unspecified site: Secondary | ICD-10-CM | POA: Diagnosis not present

## 2016-10-05 DIAGNOSIS — I340 Nonrheumatic mitral (valve) insufficiency: Secondary | ICD-10-CM | POA: Diagnosis not present

## 2016-10-05 DIAGNOSIS — G20 Parkinson's disease: Secondary | ICD-10-CM | POA: Diagnosis not present

## 2016-10-05 DIAGNOSIS — J45998 Other asthma: Secondary | ICD-10-CM | POA: Diagnosis not present

## 2016-10-05 DIAGNOSIS — M810 Age-related osteoporosis without current pathological fracture: Secondary | ICD-10-CM | POA: Diagnosis not present

## 2016-10-05 DIAGNOSIS — F17210 Nicotine dependence, cigarettes, uncomplicated: Secondary | ICD-10-CM | POA: Diagnosis not present

## 2016-10-05 DIAGNOSIS — C50919 Malignant neoplasm of unspecified site of unspecified female breast: Secondary | ICD-10-CM | POA: Diagnosis not present

## 2016-10-05 DIAGNOSIS — J988 Other specified respiratory disorders: Secondary | ICD-10-CM | POA: Diagnosis not present

## 2016-11-12 ENCOUNTER — Other Ambulatory Visit: Payer: Self-pay

## 2016-11-12 DIAGNOSIS — C50411 Malignant neoplasm of upper-outer quadrant of right female breast: Secondary | ICD-10-CM

## 2016-11-12 DIAGNOSIS — Z170 Estrogen receptor positive status [ER+]: Principal | ICD-10-CM

## 2016-11-25 ENCOUNTER — Other Ambulatory Visit: Payer: Self-pay | Admitting: General Surgery

## 2017-01-13 ENCOUNTER — Ambulatory Visit
Admission: RE | Admit: 2017-01-13 | Discharge: 2017-01-13 | Disposition: A | Discharge status: Home / Self Care | Payer: PPO | Source: Ambulatory Visit | Attending: General Surgery | Admitting: General Surgery

## 2017-01-13 DIAGNOSIS — C50411 Malignant neoplasm of upper-outer quadrant of right female breast: Secondary | ICD-10-CM

## 2017-01-13 DIAGNOSIS — Z170 Estrogen receptor positive status [ER+]: Principal | ICD-10-CM

## 2017-01-13 DIAGNOSIS — Z08 Encounter for follow-up examination after completed treatment for malignant neoplasm: Secondary | ICD-10-CM | POA: Insufficient documentation

## 2017-01-13 DIAGNOSIS — R928 Other abnormal and inconclusive findings on diagnostic imaging of breast: Secondary | ICD-10-CM | POA: Diagnosis not present

## 2017-01-13 DIAGNOSIS — Z853 Personal history of malignant neoplasm of breast: Secondary | ICD-10-CM | POA: Insufficient documentation

## 2017-01-19 ENCOUNTER — Encounter: Payer: Self-pay | Admitting: General Surgery

## 2017-01-19 ENCOUNTER — Ambulatory Visit (INDEPENDENT_AMBULATORY_CARE_PROVIDER_SITE_OTHER): Payer: PPO | Admitting: General Surgery

## 2017-01-19 VITALS — BP 106/58 | Resp 14 | Ht 66.0 in | Wt 128.0 lb

## 2017-01-19 DIAGNOSIS — Z170 Estrogen receptor positive status [ER+]: Secondary | ICD-10-CM

## 2017-01-19 DIAGNOSIS — C50411 Malignant neoplasm of upper-outer quadrant of right female breast: Secondary | ICD-10-CM

## 2017-01-19 NOTE — Patient Instructions (Addendum)
Follow up with Dr. Bary Castilla in clinic for annual breast checks and mammogram. Will order breast cancer index

## 2017-01-19 NOTE — Progress Notes (Signed)
Patient ID: Jasmine Colon, female   DOB: Mar 11, 1941, 76 y.o.   MRN: [id number]  Chief Complaint  Patient presents with  . Follow-up    HPI Jasmine Colon is a 76 y.o. female who presents for a breast cancer follow up.  The most recent mammogram was done on 01/13/2017.  Patient does perform regular self breast checks and gets regular mammograms done. She is still taking Anastrozole and tolerating it well.   HPI  Past Medical History:  Diagnosis Date  . Asthma 2012  . Hyperlipidemia   . Lesion of lateral popliteal nerve   . Lower back pain   . Malignant neoplasm of upper outer quadrant of female breast ([place]) 2013   T1b, N0, M0. 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 Mitral valve disorder   . Osteoarthritis   . Osteoporosis   . Parkinson's disease ([place]) 2012  . Personal history of radiation therapy 2013   right breast ca with lumpectomy and rad tx    Past Surgical History:  Procedure Laterality Date  . BREAST BIOPSY Right 10/26/2011   invasive mammary carcinoma and DCIS  . BREAST BIOPSY Left 01/09/2014   retroaerolar bx with Dr. Jamal Collin. Benign  . BREAST LUMPECTOMY Right 11/13/2011   invasive mammary ca, DCIS, LCIS in specimen. Clear margins.  LN negative  . BREAST SURGERY Right 2013   wide excision  . CESAREAN SECTION  31 years ago  . COLONOSCOPY  2008    Family History  Problem Relation Age of Onset  . Stroke Mother   . Heart failure Father   . Heart disease Brother        bypass  . Cancer Maternal Aunt        breast   . Breast cancer Maternal Aunt   . Cancer Maternal Aunt        breast  . Breast cancer Maternal Aunt     Social History Social History  Substance Use Topics  . Smoking status: Current Every Day Smoker    Packs/day: 0.50    Years: 45.00    Types: Cigarettes  . Smokeless tobacco: Never Used  . Alcohol use No    Allergies  Allergen Reactions  . Codeine     Feels weird  . Pseudoephedrine     Other reaction(s): Dizziness and giddiness  (finding)    Current Outpatient Prescriptions  Medication Sig Dispense Refill  . anastrozole (ARIMIDEX) 1 MG tablet TAKE 1 TABLET BY MOUTH EVERY DAY 30 tablet 6  . Calcium Carbonate-Vitamin D (CALTRATE 600+D PO) Take 3 tablets by mouth 3 (three) times daily.     . carbidopa-levodopa (SINEMET IR) 25-100 MG per tablet TAKE 0.5 TABLETS BY MOUTH 3 (THREE) TIMES DAILY. 135 tablet 3  . ibandronate (BONIVA) 150 MG tablet Take 1 tablet by mouth every 30 (thirty) days.    . Ibuprofen (ADVIL) 200 MG CAPS Take 200 mg by mouth as needed.    . Multiple Vitamins-Minerals (PRESERVISION AREDS PO) Take by mouth daily.    . rasagiline (AZILECT) 0.5 MG TABS tablet Take 0.5 mg by mouth daily.     No current facility-administered medications for this visit.     Review of Systems Review of Systems  Constitutional: Negative.   Respiratory: Negative.   Cardiovascular: Negative.     Blood pressure (!) 106/58, resp. rate 14, height 5' 6"  (1.676 m), weight 128 lb (58.1 kg).  Physical Exam Physical Exam  Constitutional: She is oriented to person, place, and time.  She appears well-developed and well-nourished.  Eyes: Conjunctivae are normal. No scleral icterus.  Neck: Neck supple.  Cardiovascular: Normal rate, regular rhythm and normal heart sounds.   Pulmonary/Chest: Effort normal and breath sounds normal. Right breast exhibits no inverted nipple, no mass, no nipple discharge, no skin change and no tenderness. Left breast exhibits no inverted nipple, no mass, no nipple discharge, no skin change and no tenderness.  Abdominal: Soft. Bowel sounds are normal. There is no hepatomegaly. There is no tenderness.  Lymphadenopathy:    She has no cervical adenopathy.    She has no axillary adenopathy.  Neurological: She is alert and oriented to person, place, and time.  Skin: Skin is warm and dry.    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 She has completed 5 years of hormonal therapy. Discussed role of breast cancer index in deciding if continued treatment would be beneficial and she is agreeable to that     Plan    Follow up with Dr. Bary Castilla in clinic for annual breast checks and mammogram. Will order breast cancer index. Information and order faxed to [facility]. [email or web address]  HPI, Physical Exam, Assessment and Plan have been scribed under the direction and in the presence of Mckinley Jewel, MD  Gaspar Cola, CMA    I have completed the exam and reviewed the above documentation for accuracy and completeness.  I agree with the above.  Jasmine Colon has been used and any errors in dictation or transcription are unintentional.  Jasmine Colon G. Jamal Collin, M.D., F.A.C.S.   Junie Panning G 01/19/2017, 12:06 PM

## 2017-02-02 ENCOUNTER — Encounter: Payer: Self-pay | Admitting: General Surgery

## 2017-02-02 ENCOUNTER — Telehealth: Payer: Self-pay | Admitting: *Deleted

## 2017-02-02 NOTE — Telephone Encounter (Signed)
Breast Cancer Index came low score per Dr Jamal Collin, she may stop anastrozole once she finishes what she has on hand, pt pleased and agrees.

## 2017-02-22 DIAGNOSIS — H1045 Other chronic allergic conjunctivitis: Secondary | ICD-10-CM | POA: Diagnosis not present

## 2017-02-22 DIAGNOSIS — H2513 Age-related nuclear cataract, bilateral: Secondary | ICD-10-CM | POA: Diagnosis not present

## 2017-02-22 DIAGNOSIS — H401130 Primary open-angle glaucoma, bilateral, stage unspecified: Secondary | ICD-10-CM | POA: Diagnosis not present

## 2017-02-22 DIAGNOSIS — H353131 Nonexudative age-related macular degeneration, bilateral, early dry stage: Secondary | ICD-10-CM | POA: Diagnosis not present

## 2017-04-16 DIAGNOSIS — J309 Allergic rhinitis, unspecified: Secondary | ICD-10-CM | POA: Diagnosis not present

## 2017-04-16 DIAGNOSIS — F17210 Nicotine dependence, cigarettes, uncomplicated: Secondary | ICD-10-CM | POA: Diagnosis not present

## 2017-04-16 DIAGNOSIS — J45998 Other asthma: Secondary | ICD-10-CM | POA: Diagnosis not present

## 2017-04-16 DIAGNOSIS — M1990 Unspecified osteoarthritis, unspecified site: Secondary | ICD-10-CM | POA: Diagnosis not present

## 2017-04-16 DIAGNOSIS — Z23 Encounter for immunization: Secondary | ICD-10-CM | POA: Diagnosis not present

## 2017-04-16 DIAGNOSIS — M810 Age-related osteoporosis without current pathological fracture: Secondary | ICD-10-CM | POA: Diagnosis not present

## 2017-04-16 DIAGNOSIS — C50919 Malignant neoplasm of unspecified site of unspecified female breast: Secondary | ICD-10-CM | POA: Diagnosis not present

## 2017-04-16 DIAGNOSIS — G20 Parkinson's disease: Secondary | ICD-10-CM | POA: Diagnosis not present

## 2017-04-16 DIAGNOSIS — I340 Nonrheumatic mitral (valve) insufficiency: Secondary | ICD-10-CM | POA: Diagnosis not present

## 2017-04-16 DIAGNOSIS — E785 Hyperlipidemia, unspecified: Secondary | ICD-10-CM | POA: Diagnosis not present

## 2017-06-22 DIAGNOSIS — G20 Parkinson's disease: Secondary | ICD-10-CM | POA: Diagnosis not present

## 2017-08-16 DIAGNOSIS — H2513 Age-related nuclear cataract, bilateral: Secondary | ICD-10-CM | POA: Diagnosis not present

## 2017-08-16 DIAGNOSIS — H353131 Nonexudative age-related macular degeneration, bilateral, early dry stage: Secondary | ICD-10-CM | POA: Diagnosis not present

## 2017-08-16 DIAGNOSIS — H40019 Open angle with borderline findings, low risk, unspecified eye: Secondary | ICD-10-CM | POA: Diagnosis not present

## 2017-08-16 DIAGNOSIS — H1045 Other chronic allergic conjunctivitis: Secondary | ICD-10-CM | POA: Diagnosis not present

## 2017-10-08 DIAGNOSIS — E785 Hyperlipidemia, unspecified: Secondary | ICD-10-CM | POA: Diagnosis not present

## 2017-10-08 DIAGNOSIS — M810 Age-related osteoporosis without current pathological fracture: Secondary | ICD-10-CM | POA: Diagnosis not present

## 2017-10-08 DIAGNOSIS — C50919 Malignant neoplasm of unspecified site of unspecified female breast: Secondary | ICD-10-CM | POA: Diagnosis not present

## 2017-10-08 DIAGNOSIS — G20 Parkinson's disease: Secondary | ICD-10-CM | POA: Diagnosis not present

## 2017-10-15 DIAGNOSIS — M1990 Unspecified osteoarthritis, unspecified site: Secondary | ICD-10-CM | POA: Diagnosis not present

## 2017-10-15 DIAGNOSIS — E785 Hyperlipidemia, unspecified: Secondary | ICD-10-CM | POA: Diagnosis not present

## 2017-10-15 DIAGNOSIS — I340 Nonrheumatic mitral (valve) insufficiency: Secondary | ICD-10-CM | POA: Diagnosis not present

## 2017-10-15 DIAGNOSIS — G20 Parkinson's disease: Secondary | ICD-10-CM | POA: Diagnosis not present

## 2017-10-15 DIAGNOSIS — J45998 Other asthma: Secondary | ICD-10-CM | POA: Diagnosis not present

## 2017-10-15 DIAGNOSIS — M810 Age-related osteoporosis without current pathological fracture: Secondary | ICD-10-CM | POA: Diagnosis not present

## 2017-10-15 DIAGNOSIS — C50919 Malignant neoplasm of unspecified site of unspecified female breast: Secondary | ICD-10-CM | POA: Diagnosis not present

## 2017-10-15 DIAGNOSIS — H81399 Other peripheral vertigo, unspecified ear: Secondary | ICD-10-CM | POA: Diagnosis not present

## 2017-10-15 DIAGNOSIS — J988 Other specified respiratory disorders: Secondary | ICD-10-CM | POA: Diagnosis not present

## 2017-10-15 DIAGNOSIS — F17210 Nicotine dependence, cigarettes, uncomplicated: Secondary | ICD-10-CM | POA: Diagnosis not present

## 2017-10-29 DIAGNOSIS — Z23 Encounter for immunization: Secondary | ICD-10-CM | POA: Diagnosis not present

## 2017-10-29 DIAGNOSIS — Z1211 Encounter for screening for malignant neoplasm of colon: Secondary | ICD-10-CM | POA: Diagnosis not present

## 2017-10-29 DIAGNOSIS — Z0000 Encounter for general adult medical examination without abnormal findings: Secondary | ICD-10-CM | POA: Diagnosis not present

## 2017-11-05 DIAGNOSIS — F17210 Nicotine dependence, cigarettes, uncomplicated: Secondary | ICD-10-CM | POA: Diagnosis not present

## 2017-11-05 DIAGNOSIS — Z87891 Personal history of nicotine dependence: Secondary | ICD-10-CM | POA: Diagnosis not present

## 2017-11-05 DIAGNOSIS — Z122 Encounter for screening for malignant neoplasm of respiratory organs: Secondary | ICD-10-CM | POA: Diagnosis not present

## 2017-11-12 DIAGNOSIS — F17210 Nicotine dependence, cigarettes, uncomplicated: Secondary | ICD-10-CM | POA: Diagnosis not present

## 2017-11-12 DIAGNOSIS — C50919 Malignant neoplasm of unspecified site of unspecified female breast: Secondary | ICD-10-CM | POA: Diagnosis not present

## 2017-11-12 DIAGNOSIS — M1990 Unspecified osteoarthritis, unspecified site: Secondary | ICD-10-CM | POA: Diagnosis not present

## 2017-11-12 DIAGNOSIS — G20 Parkinson's disease: Secondary | ICD-10-CM | POA: Diagnosis not present

## 2017-11-12 DIAGNOSIS — I340 Nonrheumatic mitral (valve) insufficiency: Secondary | ICD-10-CM | POA: Diagnosis not present

## 2017-11-12 DIAGNOSIS — J988 Other specified respiratory disorders: Secondary | ICD-10-CM | POA: Diagnosis not present

## 2017-11-12 DIAGNOSIS — J45998 Other asthma: Secondary | ICD-10-CM | POA: Diagnosis not present

## 2017-11-12 DIAGNOSIS — M810 Age-related osteoporosis without current pathological fracture: Secondary | ICD-10-CM | POA: Diagnosis not present

## 2017-11-12 DIAGNOSIS — H81399 Other peripheral vertigo, unspecified ear: Secondary | ICD-10-CM | POA: Diagnosis not present

## 2017-11-12 DIAGNOSIS — E785 Hyperlipidemia, unspecified: Secondary | ICD-10-CM | POA: Diagnosis not present

## 2017-11-23 ENCOUNTER — Other Ambulatory Visit: Payer: Self-pay

## 2017-11-23 DIAGNOSIS — C50411 Malignant neoplasm of upper-outer quadrant of right female breast: Secondary | ICD-10-CM

## 2017-11-23 DIAGNOSIS — Z170 Estrogen receptor positive status [ER+]: Principal | ICD-10-CM

## 2017-11-30 ENCOUNTER — Other Ambulatory Visit: Payer: Self-pay | Admitting: General Surgery

## 2017-11-30 DIAGNOSIS — Z1231 Encounter for screening mammogram for malignant neoplasm of breast: Secondary | ICD-10-CM

## 2017-11-30 DIAGNOSIS — R42 Dizziness and giddiness: Secondary | ICD-10-CM | POA: Diagnosis not present

## 2017-11-30 DIAGNOSIS — R9431 Abnormal electrocardiogram [ECG] [EKG]: Secondary | ICD-10-CM | POA: Diagnosis not present

## 2017-11-30 DIAGNOSIS — I340 Nonrheumatic mitral (valve) insufficiency: Secondary | ICD-10-CM | POA: Diagnosis not present

## 2017-11-30 DIAGNOSIS — R0989 Other specified symptoms and signs involving the circulatory and respiratory systems: Secondary | ICD-10-CM | POA: Diagnosis not present

## 2017-11-30 DIAGNOSIS — R0602 Shortness of breath: Secondary | ICD-10-CM | POA: Diagnosis not present

## 2017-12-01 DIAGNOSIS — R0602 Shortness of breath: Secondary | ICD-10-CM | POA: Diagnosis not present

## 2017-12-01 DIAGNOSIS — R42 Dizziness and giddiness: Secondary | ICD-10-CM | POA: Diagnosis not present

## 2017-12-01 DIAGNOSIS — R9431 Abnormal electrocardiogram [ECG] [EKG]: Secondary | ICD-10-CM | POA: Diagnosis not present

## 2017-12-01 DIAGNOSIS — R0989 Other specified symptoms and signs involving the circulatory and respiratory systems: Secondary | ICD-10-CM | POA: Diagnosis not present

## 2017-12-01 DIAGNOSIS — R002 Palpitations: Secondary | ICD-10-CM | POA: Diagnosis not present

## 2017-12-01 DIAGNOSIS — I340 Nonrheumatic mitral (valve) insufficiency: Secondary | ICD-10-CM | POA: Diagnosis not present

## 2017-12-08 ENCOUNTER — Encounter: Payer: Self-pay | Admitting: *Deleted

## 2017-12-10 DIAGNOSIS — F17210 Nicotine dependence, cigarettes, uncomplicated: Secondary | ICD-10-CM | POA: Diagnosis not present

## 2017-12-10 DIAGNOSIS — M1990 Unspecified osteoarthritis, unspecified site: Secondary | ICD-10-CM | POA: Diagnosis not present

## 2017-12-10 DIAGNOSIS — H81399 Other peripheral vertigo, unspecified ear: Secondary | ICD-10-CM | POA: Diagnosis not present

## 2017-12-10 DIAGNOSIS — J45998 Other asthma: Secondary | ICD-10-CM | POA: Diagnosis not present

## 2017-12-10 DIAGNOSIS — E785 Hyperlipidemia, unspecified: Secondary | ICD-10-CM | POA: Diagnosis not present

## 2017-12-10 DIAGNOSIS — C50919 Malignant neoplasm of unspecified site of unspecified female breast: Secondary | ICD-10-CM | POA: Diagnosis not present

## 2017-12-10 DIAGNOSIS — J988 Other specified respiratory disorders: Secondary | ICD-10-CM | POA: Diagnosis not present

## 2017-12-10 DIAGNOSIS — G20 Parkinson's disease: Secondary | ICD-10-CM | POA: Diagnosis not present

## 2017-12-10 DIAGNOSIS — I340 Nonrheumatic mitral (valve) insufficiency: Secondary | ICD-10-CM | POA: Diagnosis not present

## 2017-12-10 DIAGNOSIS — M810 Age-related osteoporosis without current pathological fracture: Secondary | ICD-10-CM | POA: Diagnosis not present

## 2017-12-23 DIAGNOSIS — G20 Parkinson's disease: Secondary | ICD-10-CM | POA: Diagnosis not present

## 2018-01-14 ENCOUNTER — Ambulatory Visit
Admission: RE | Admit: 2018-01-14 | Discharge: 2018-01-14 | Disposition: A | Discharge status: Home / Self Care | Payer: PPO | Source: Ambulatory Visit | Attending: General Surgery | Admitting: General Surgery

## 2018-01-14 DIAGNOSIS — Z1231 Encounter for screening mammogram for malignant neoplasm of breast: Secondary | ICD-10-CM | POA: Diagnosis not present

## 2018-01-25 ENCOUNTER — Encounter: Payer: Self-pay | Admitting: General Surgery

## 2018-01-25 ENCOUNTER — Ambulatory Visit (INDEPENDENT_AMBULATORY_CARE_PROVIDER_SITE_OTHER): Payer: PPO | Admitting: General Surgery

## 2018-01-25 VITALS — BP 130/72 | HR 96 | Resp 14 | Ht 65.0 in | Wt 127.0 lb

## 2018-01-25 DIAGNOSIS — C50411 Malignant neoplasm of upper-outer quadrant of right female breast: Secondary | ICD-10-CM

## 2018-01-25 NOTE — Patient Instructions (Signed)
Patient will be asked to return to the office in one year with a bilateral screening mammogram. The patient is aware to call back for any questions or concerns. 

## 2018-01-25 NOTE — Progress Notes (Signed)
Patient ID: Jasmine Colon, female   DOB: 1940/07/26, 77 y.o.   MRN: [id number]  Chief Complaint  Patient presents with  . Follow-up    HPI Jasmine Colon is a 77 y.o. female who presents for a breast evaluation. The most recent mammogram was done on 9/27/209.   Patient does perform regular self breast checks and gets regular mammograms done.    HPI  Past Medical History:  Diagnosis Date  . Asthma 2012  . Hyperlipidemia   . Lesion of lateral popliteal nerve   . Lower back pain   . 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 Mitral valve disorder   . Osteoarthritis   . Osteoporosis   . Parkinson's disease ([place]) 2012  . Personal history of radiation therapy 2013   right breast ca with lumpectomy and rad tx    Past Surgical History:  Procedure Laterality Date  . BREAST BIOPSY Right 10/26/2011   invasive mammary carcinoma and DCIS  . BREAST BIOPSY Left 01/09/2014   retroaerolar bx with Dr. Jamal Collin. Benign  . BREAST LUMPECTOMY Right 11/13/2011   invasive mammary ca, DCIS, LCIS in specimen. Clear margins.  LN negative  . BREAST SURGERY Right 2013   wide excision  . CESAREAN SECTION  31 years ago  . COLONOSCOPY  2008    Family History  Problem Relation Age of Onset  . Stroke Mother   . Heart failure Father   . Heart disease Brother        bypass  . Cancer Maternal Aunt        breast   . Breast cancer Maternal Aunt   . Cancer Maternal Aunt        breast  . Breast cancer Maternal Aunt     Social History Social History   Tobacco Use  . Smoking status: Current Every Day Smoker    Packs/day: 0.50    Years: 45.00    Pack years: 22.50    Types: Cigarettes  . Smokeless tobacco: Never Used  Substance Use Topics  . Alcohol use: No  . Drug use: No    Allergies  Allergen Reactions  . Codeine     Feels weird  . Pseudoephedrine     Other reaction(s): Dizziness and giddiness  (finding)    Current Outpatient Medications  Medication Sig Dispense Refill  . aspirin EC 81 MG tablet Take 81 mg by mouth daily.    . Calcium Carbonate-Vitamin D (CALTRATE 600+D PO) Take 3 tablets by mouth 3 (three) times daily.     . carbidopa-levodopa (SINEMET IR) 25-100 MG per tablet TAKE 0.5 TABLETS BY MOUTH 3 (THREE) TIMES DAILY. 135 tablet 3  . ibandronate (BONIVA) 150 MG tablet Take 1 tablet by mouth every 30 (thirty) days.    . Ibuprofen (ADVIL) 200 MG CAPS Take 200 mg by mouth as needed.    . Multiple Vitamins-Minerals (PRESERVISION AREDS PO) Take by mouth daily.    . rasagiline (AZILECT) 0.5 MG TABS tablet Take 0.5 mg by mouth daily.    . simvastatin (ZOCOR) 20 MG tablet Take 10 mg by mouth daily.      No current facility-administered medications for this visit.     Review of Systems Review of Systems  Constitutional: Negative.   Respiratory: Negative.   Cardiovascular: Negative.     Blood pressure 130/72, pulse 96, resp. rate 14, height 5\' 5"  (1.651 m),  weight 127 lb (57.6 kg).  Physical Exam Physical Exam  Constitutional: She is oriented to person, place, and time. She appears well-developed and well-nourished.  Eyes: Conjunctivae are normal. No scleral icterus.  Neck: Neck supple.  Cardiovascular: Normal rate, regular rhythm and normal heart sounds.  Pulmonary/Chest: Effort normal and breath sounds normal. Right breast exhibits no inverted nipple, no mass, no nipple discharge, no skin change and no tenderness. Left breast exhibits no inverted nipple, no mass, no nipple discharge, no skin change and no tenderness.    Lymphadenopathy:    She has no cervical adenopathy.    She has no axillary adenopathy.  Neurological: She is alert and oriented to person, place, and time.  Skin: Skin is warm and dry.    Data Reviewed Bilateral screening mammograms were reviewed.  Postsurgical change.  BI-RADS-1.  Assessment    Benign breast exam, now 6 years status post  breast conservation.  Low BCI index, no indication for extended endocrine therapy.    Plan  Patient will be asked to return to the office in one year with a bilateral screening mammogram. The patient is aware to call back for any questions or concerns.   HPI, Physical Exam, Assessment and Plan have been scribed under the direction and in the presence of Hervey Ard, MD.  Gaspar Cola, CMA  I have completed the exam and reviewed the above documentation for accuracy and completeness.  I agree with the above.  Haematologist has been used and any errors in dictation or transcription are unintentional.  Hervey Ard, M.D., F.A.C.S.  Jasmine Colon 01/26/2018, 7:51 PM

## 2018-04-26 DIAGNOSIS — M1990 Unspecified osteoarthritis, unspecified site: Secondary | ICD-10-CM | POA: Diagnosis not present

## 2018-04-26 DIAGNOSIS — G20 Parkinson's disease: Secondary | ICD-10-CM | POA: Diagnosis not present

## 2018-04-26 DIAGNOSIS — C50919 Malignant neoplasm of unspecified site of unspecified female breast: Secondary | ICD-10-CM | POA: Diagnosis not present

## 2018-04-26 DIAGNOSIS — I340 Nonrheumatic mitral (valve) insufficiency: Secondary | ICD-10-CM | POA: Diagnosis not present

## 2018-04-26 DIAGNOSIS — M810 Age-related osteoporosis without current pathological fracture: Secondary | ICD-10-CM | POA: Diagnosis not present

## 2018-04-26 DIAGNOSIS — E785 Hyperlipidemia, unspecified: Secondary | ICD-10-CM | POA: Diagnosis not present

## 2018-05-03 DIAGNOSIS — F17210 Nicotine dependence, cigarettes, uncomplicated: Secondary | ICD-10-CM | POA: Diagnosis not present

## 2018-05-03 DIAGNOSIS — J988 Other specified respiratory disorders: Secondary | ICD-10-CM | POA: Diagnosis not present

## 2018-05-03 DIAGNOSIS — M810 Age-related osteoporosis without current pathological fracture: Secondary | ICD-10-CM | POA: Diagnosis not present

## 2018-05-03 DIAGNOSIS — J45998 Other asthma: Secondary | ICD-10-CM | POA: Diagnosis not present

## 2018-05-03 DIAGNOSIS — H81399 Other peripheral vertigo, unspecified ear: Secondary | ICD-10-CM | POA: Diagnosis not present

## 2018-05-03 DIAGNOSIS — G20 Parkinson's disease: Secondary | ICD-10-CM | POA: Diagnosis not present

## 2018-05-03 DIAGNOSIS — C50919 Malignant neoplasm of unspecified site of unspecified female breast: Secondary | ICD-10-CM | POA: Diagnosis not present

## 2018-05-03 DIAGNOSIS — I340 Nonrheumatic mitral (valve) insufficiency: Secondary | ICD-10-CM | POA: Diagnosis not present

## 2018-05-03 DIAGNOSIS — M1990 Unspecified osteoarthritis, unspecified site: Secondary | ICD-10-CM | POA: Diagnosis not present

## 2018-05-03 DIAGNOSIS — E785 Hyperlipidemia, unspecified: Secondary | ICD-10-CM | POA: Diagnosis not present

## 2018-06-28 DIAGNOSIS — G20 Parkinson's disease: Secondary | ICD-10-CM | POA: Diagnosis not present

## 2018-11-08 ENCOUNTER — Encounter: Payer: Self-pay | Admitting: General Surgery

## 2018-11-11 DIAGNOSIS — G20 Parkinson's disease: Secondary | ICD-10-CM | POA: Diagnosis not present

## 2018-11-11 DIAGNOSIS — E785 Hyperlipidemia, unspecified: Secondary | ICD-10-CM | POA: Diagnosis not present

## 2018-11-11 DIAGNOSIS — J45998 Other asthma: Secondary | ICD-10-CM | POA: Diagnosis not present

## 2018-11-11 DIAGNOSIS — M810 Age-related osteoporosis without current pathological fracture: Secondary | ICD-10-CM | POA: Diagnosis not present

## 2018-11-11 DIAGNOSIS — C50919 Malignant neoplasm of unspecified site of unspecified female breast: Secondary | ICD-10-CM | POA: Diagnosis not present

## 2018-11-18 DIAGNOSIS — G20 Parkinson's disease: Secondary | ICD-10-CM | POA: Diagnosis not present

## 2018-11-18 DIAGNOSIS — E785 Hyperlipidemia, unspecified: Secondary | ICD-10-CM | POA: Diagnosis not present

## 2018-11-18 DIAGNOSIS — I340 Nonrheumatic mitral (valve) insufficiency: Secondary | ICD-10-CM | POA: Diagnosis not present

## 2018-11-18 DIAGNOSIS — J988 Other specified respiratory disorders: Secondary | ICD-10-CM | POA: Diagnosis not present

## 2018-11-18 DIAGNOSIS — H81399 Other peripheral vertigo, unspecified ear: Secondary | ICD-10-CM | POA: Diagnosis not present

## 2018-11-18 DIAGNOSIS — J45998 Other asthma: Secondary | ICD-10-CM | POA: Diagnosis not present

## 2018-11-18 DIAGNOSIS — M1990 Unspecified osteoarthritis, unspecified site: Secondary | ICD-10-CM | POA: Diagnosis not present

## 2018-11-18 DIAGNOSIS — C50919 Malignant neoplasm of unspecified site of unspecified female breast: Secondary | ICD-10-CM | POA: Diagnosis not present

## 2018-11-18 DIAGNOSIS — M810 Age-related osteoporosis without current pathological fracture: Secondary | ICD-10-CM | POA: Diagnosis not present

## 2018-11-18 DIAGNOSIS — F17210 Nicotine dependence, cigarettes, uncomplicated: Secondary | ICD-10-CM | POA: Diagnosis not present

## 2018-12-13 ENCOUNTER — Other Ambulatory Visit: Payer: Self-pay

## 2018-12-13 DIAGNOSIS — Z1231 Encounter for screening mammogram for malignant neoplasm of breast: Secondary | ICD-10-CM

## 2018-12-29 DIAGNOSIS — G20 Parkinson's disease: Secondary | ICD-10-CM | POA: Diagnosis not present

## 2018-12-29 DIAGNOSIS — T50905A Adverse effect of unspecified drugs, medicaments and biological substances, initial encounter: Secondary | ICD-10-CM | POA: Diagnosis not present

## 2019-01-24 ENCOUNTER — Ambulatory Visit: Payer: PPO | Admitting: Surgery

## 2019-02-24 ENCOUNTER — Ambulatory Visit
Admission: RE | Admit: 2019-02-24 | Discharge: 2019-02-24 | Disposition: A | Discharge status: Home / Self Care | Payer: PPO | Source: Ambulatory Visit | Attending: Surgery | Admitting: Surgery

## 2019-02-24 DIAGNOSIS — Z1231 Encounter for screening mammogram for malignant neoplasm of breast: Secondary | ICD-10-CM | POA: Insufficient documentation

## 2019-03-01 ENCOUNTER — Ambulatory Visit: Payer: PPO | Admitting: Surgery

## 2019-03-03 ENCOUNTER — Ambulatory Visit: Payer: PPO | Admitting: Surgery

## 2019-03-14 ENCOUNTER — Other Ambulatory Visit: Payer: Self-pay

## 2019-03-14 ENCOUNTER — Telehealth (INDEPENDENT_AMBULATORY_CARE_PROVIDER_SITE_OTHER): Payer: PPO | Admitting: Surgery

## 2019-03-14 DIAGNOSIS — C50411 Malignant neoplasm of upper-outer quadrant of right female breast: Secondary | ICD-10-CM

## 2019-03-14 DIAGNOSIS — Z170 Estrogen receptor positive status [ER+]: Secondary | ICD-10-CM

## 2019-03-14 NOTE — Progress Notes (Signed)
Virtual Visit via Telephone Note  I connected with Jasmine Colon on 03/14/19 at 11:00 AM EST by telephone and verified that I am speaking with the correct person using two identifiers.  Location: Patient:  Home  Provider:  Office    I discussed the limitations, risks, security and privacy concerns of performing an evaluation and management service by telephone and the availability of in person appointments. I also discussed with the patient that there may be a patient responsible charge related to this service. The patient expressed understanding and agreed to proceed.   History of Present Illness: This is a 78 yo female s/p right breast lumpectomy and right axillary SLNBx with Dr. Jamal Collin in 10/2011.  She has been doing yearly follow ups and mammograms.  Her last mammogram was on 02/24/19.  She reports she's doing well and has not noted any masses or skin/nipple changes on either breast.  She does have Parkinson's and says sometimes she has issues with it.   Observations/Objective: Patient does not appear in any acute distress.  She's speaking in full sentences without any shortness of breath.  Mammogram 02/24/19: FINDINGS: There are no findings suspicious for malignancy.  RIGHT lumpectomy changes and a biopsy clip within the LEFT breast again noted.  Images were processed with CAD.  IMPRESSION: No mammographic evidence of malignancy. A result letter of this screening mammogram will be mailed directly to the patient.  RECOMMENDATION: Screening mammogram in one year. (Code:SM-B-01Y)  BI-RADS CATEGORY  2: Benign.   Assessment and Plan: 78 yo female s/p right breast lumpectomy and SLNBx in 2013.  Discussed with the patient that she's doing well and she appears to be having no issues from the surgical standpoint.  Her mammogram this year was negative and as far as the last 5 years have all been negative.  At this point, I think it's appropriate to defer further care to her  PCP.  This would include future yearly screening mammograms and breast exams.  Discussed with the patient that if there are any issues such as abnormal findings on mammograms, any abnormal findings on her own self exams, or any concerns, we can most definitely see her and be of any help needed.  Follow Up Instructions: Follow up prn.    I discussed the assessment and treatment plan with the patient. The patient was provided an opportunity to ask questions and all were answered. The patient agreed with the plan and demonstrated an understanding of the instructions.   The patient was advised to call back or seek an in-person evaluation if the symptoms worsen or if the condition fails to improve as anticipated.  I provided 15 minutes of non-face-to-face time during this encounter.   Olean Ree, MD

## 2019-07-25 DIAGNOSIS — G20 Parkinson's disease: Secondary | ICD-10-CM | POA: Diagnosis not present

## 2020-01-11 DIAGNOSIS — G20 Parkinson's disease: Secondary | ICD-10-CM | POA: Diagnosis not present

## 2020-01-11 DIAGNOSIS — E559 Vitamin D deficiency, unspecified: Secondary | ICD-10-CM | POA: Diagnosis not present

## 2020-01-11 DIAGNOSIS — Z79899 Other long term (current) drug therapy: Secondary | ICD-10-CM | POA: Diagnosis not present

## 2020-01-11 DIAGNOSIS — E538 Deficiency of other specified B group vitamins: Secondary | ICD-10-CM | POA: Diagnosis not present

## 2020-01-25 DIAGNOSIS — G20 Parkinson's disease: Secondary | ICD-10-CM | POA: Diagnosis not present

## 2020-01-25 DIAGNOSIS — M810 Age-related osteoporosis without current pathological fracture: Secondary | ICD-10-CM | POA: Diagnosis not present

## 2020-01-25 DIAGNOSIS — E559 Vitamin D deficiency, unspecified: Secondary | ICD-10-CM | POA: Diagnosis not present

## 2020-01-25 DIAGNOSIS — Z780 Asymptomatic menopausal state: Secondary | ICD-10-CM | POA: Diagnosis not present

## 2020-01-25 DIAGNOSIS — E538 Deficiency of other specified B group vitamins: Secondary | ICD-10-CM | POA: Diagnosis not present

## 2020-01-25 DIAGNOSIS — Z136 Encounter for screening for cardiovascular disorders: Secondary | ICD-10-CM | POA: Diagnosis not present

## 2020-01-29 DIAGNOSIS — Z136 Encounter for screening for cardiovascular disorders: Secondary | ICD-10-CM | POA: Diagnosis not present

## 2020-01-29 DIAGNOSIS — Z23 Encounter for immunization: Secondary | ICD-10-CM | POA: Diagnosis not present

## 2020-02-16 DIAGNOSIS — M8588 Other specified disorders of bone density and structure, other site: Secondary | ICD-10-CM | POA: Diagnosis not present

## 2020-02-23 DIAGNOSIS — F32A Depression, unspecified: Secondary | ICD-10-CM | POA: Diagnosis not present

## 2020-02-23 DIAGNOSIS — G20 Parkinson's disease: Secondary | ICD-10-CM | POA: Diagnosis not present

## 2020-06-24 DIAGNOSIS — G20 Parkinson's disease: Secondary | ICD-10-CM | POA: Diagnosis not present

## 2020-06-24 DIAGNOSIS — E538 Deficiency of other specified B group vitamins: Secondary | ICD-10-CM | POA: Diagnosis not present

## 2020-06-24 DIAGNOSIS — E559 Vitamin D deficiency, unspecified: Secondary | ICD-10-CM | POA: Diagnosis not present

## 2020-07-10 DIAGNOSIS — G249 Dystonia, unspecified: Secondary | ICD-10-CM | POA: Diagnosis not present

## 2020-07-10 DIAGNOSIS — F32A Depression, unspecified: Secondary | ICD-10-CM | POA: Diagnosis not present

## 2020-07-10 DIAGNOSIS — R42 Dizziness and giddiness: Secondary | ICD-10-CM | POA: Diagnosis not present

## 2020-07-10 DIAGNOSIS — E559 Vitamin D deficiency, unspecified: Secondary | ICD-10-CM | POA: Diagnosis not present

## 2020-07-10 DIAGNOSIS — G20 Parkinson's disease: Secondary | ICD-10-CM | POA: Diagnosis not present

## 2020-07-10 DIAGNOSIS — E538 Deficiency of other specified B group vitamins: Secondary | ICD-10-CM | POA: Diagnosis not present

## 2020-07-11 ENCOUNTER — Other Ambulatory Visit: Payer: Self-pay | Admitting: Neurology

## 2020-07-11 DIAGNOSIS — R42 Dizziness and giddiness: Secondary | ICD-10-CM

## 2020-07-11 DIAGNOSIS — G20 Parkinson's disease: Secondary | ICD-10-CM

## 2020-07-25 DIAGNOSIS — E559 Vitamin D deficiency, unspecified: Secondary | ICD-10-CM | POA: Diagnosis not present

## 2020-07-25 DIAGNOSIS — E538 Deficiency of other specified B group vitamins: Secondary | ICD-10-CM | POA: Diagnosis not present

## 2020-07-25 DIAGNOSIS — F17210 Nicotine dependence, cigarettes, uncomplicated: Secondary | ICD-10-CM | POA: Diagnosis not present

## 2020-07-25 DIAGNOSIS — Z0000 Encounter for general adult medical examination without abnormal findings: Secondary | ICD-10-CM | POA: Diagnosis not present

## 2020-08-02 ENCOUNTER — Ambulatory Visit
Admission: RE | Admit: 2020-08-02 | Discharge: 2020-08-02 | Disposition: A | Discharge status: Home / Self Care | Payer: PPO | Source: Ambulatory Visit | Attending: Neurology | Admitting: Neurology

## 2020-08-02 ENCOUNTER — Other Ambulatory Visit: Payer: Self-pay

## 2020-08-02 DIAGNOSIS — R42 Dizziness and giddiness: Secondary | ICD-10-CM | POA: Diagnosis not present

## 2020-08-02 DIAGNOSIS — G20 Parkinson's disease: Secondary | ICD-10-CM | POA: Diagnosis not present

## 2020-08-15 DIAGNOSIS — G20 Parkinson's disease: Secondary | ICD-10-CM | POA: Diagnosis not present

## 2020-09-21 ENCOUNTER — Emergency Department: Payer: PPO

## 2020-09-21 ENCOUNTER — Inpatient Hospital Stay: Payer: PPO | Admitting: Anesthesiology

## 2020-09-21 ENCOUNTER — Other Ambulatory Visit: Payer: Self-pay

## 2020-09-21 ENCOUNTER — Encounter
Admission: EM | Disposition: A | Discharge status: Skilled Nursing Facility | Payer: Self-pay | Source: Home / Self Care | Attending: Internal Medicine

## 2020-09-21 ENCOUNTER — Inpatient Hospital Stay
Admission: EM | Admit: 2020-09-21 | Discharge: 2020-09-24 | DRG: 522 | Disposition: A | Discharge status: Skilled Nursing Facility | Payer: PPO | Attending: Internal Medicine | Admitting: Internal Medicine

## 2020-09-21 ENCOUNTER — Inpatient Hospital Stay: Payer: PPO

## 2020-09-21 DIAGNOSIS — G249 Dystonia, unspecified: Secondary | ICD-10-CM | POA: Diagnosis not present

## 2020-09-21 DIAGNOSIS — G20 Parkinson's disease: Secondary | ICD-10-CM | POA: Diagnosis present

## 2020-09-21 DIAGNOSIS — Z885 Allergy status to narcotic agent status: Secondary | ICD-10-CM

## 2020-09-21 DIAGNOSIS — Z7982 Long term (current) use of aspirin: Secondary | ICD-10-CM

## 2020-09-21 DIAGNOSIS — E440 Moderate protein-calorie malnutrition: Secondary | ICD-10-CM | POA: Diagnosis not present

## 2020-09-21 DIAGNOSIS — W19XXXD Unspecified fall, subsequent encounter: Secondary | ICD-10-CM | POA: Diagnosis not present

## 2020-09-21 DIAGNOSIS — Z471 Aftercare following joint replacement surgery: Secondary | ICD-10-CM | POA: Diagnosis not present

## 2020-09-21 DIAGNOSIS — Z888 Allergy status to other drugs, medicaments and biological substances status: Secondary | ICD-10-CM | POA: Diagnosis not present

## 2020-09-21 DIAGNOSIS — W19XXXA Unspecified fall, initial encounter: Secondary | ICD-10-CM | POA: Diagnosis not present

## 2020-09-21 DIAGNOSIS — M1990 Unspecified osteoarthritis, unspecified site: Secondary | ICD-10-CM | POA: Diagnosis not present

## 2020-09-21 DIAGNOSIS — M810 Age-related osteoporosis without current pathological fracture: Secondary | ICD-10-CM | POA: Diagnosis not present

## 2020-09-21 DIAGNOSIS — Z96649 Presence of unspecified artificial hip joint: Secondary | ICD-10-CM | POA: Diagnosis not present

## 2020-09-21 DIAGNOSIS — R079 Chest pain, unspecified: Secondary | ICD-10-CM | POA: Diagnosis not present

## 2020-09-21 DIAGNOSIS — S72032A Displaced midcervical fracture of left femur, initial encounter for closed fracture: Secondary | ICD-10-CM | POA: Diagnosis not present

## 2020-09-21 DIAGNOSIS — Z66 Do not resuscitate: Secondary | ICD-10-CM | POA: Diagnosis not present

## 2020-09-21 DIAGNOSIS — W19XXXS Unspecified fall, sequela: Secondary | ICD-10-CM | POA: Diagnosis not present

## 2020-09-21 DIAGNOSIS — J45909 Unspecified asthma, uncomplicated: Secondary | ICD-10-CM | POA: Diagnosis not present

## 2020-09-21 DIAGNOSIS — M2550 Pain in unspecified joint: Secondary | ICD-10-CM | POA: Diagnosis not present

## 2020-09-21 DIAGNOSIS — D72829 Elevated white blood cell count, unspecified: Secondary | ICD-10-CM | POA: Diagnosis not present

## 2020-09-21 DIAGNOSIS — J310 Chronic rhinitis: Secondary | ICD-10-CM | POA: Diagnosis present

## 2020-09-21 DIAGNOSIS — Z853 Personal history of malignant neoplasm of breast: Secondary | ICD-10-CM

## 2020-09-21 DIAGNOSIS — Z923 Personal history of irradiation: Secondary | ICD-10-CM | POA: Diagnosis not present

## 2020-09-21 DIAGNOSIS — Z96642 Presence of left artificial hip joint: Secondary | ICD-10-CM | POA: Diagnosis not present

## 2020-09-21 DIAGNOSIS — Z803 Family history of malignant neoplasm of breast: Secondary | ICD-10-CM | POA: Diagnosis not present

## 2020-09-21 DIAGNOSIS — Z20822 Contact with and (suspected) exposure to covid-19: Secondary | ICD-10-CM | POA: Diagnosis present

## 2020-09-21 DIAGNOSIS — Z9889 Other specified postprocedural states: Secondary | ICD-10-CM | POA: Diagnosis not present

## 2020-09-21 DIAGNOSIS — T84021D Dislocation of internal left hip prosthesis, subsequent encounter: Secondary | ICD-10-CM | POA: Diagnosis not present

## 2020-09-21 DIAGNOSIS — S72012A Unspecified intracapsular fracture of left femur, initial encounter for closed fracture: Principal | ICD-10-CM | POA: Diagnosis present

## 2020-09-21 DIAGNOSIS — Z23 Encounter for immunization: Secondary | ICD-10-CM

## 2020-09-21 DIAGNOSIS — Z79899 Other long term (current) drug therapy: Secondary | ICD-10-CM

## 2020-09-21 DIAGNOSIS — F17210 Nicotine dependence, cigarettes, uncomplicated: Secondary | ICD-10-CM | POA: Diagnosis present

## 2020-09-21 DIAGNOSIS — R0902 Hypoxemia: Secondary | ICD-10-CM | POA: Diagnosis not present

## 2020-09-21 DIAGNOSIS — S72002A Fracture of unspecified part of neck of left femur, initial encounter for closed fracture: Secondary | ICD-10-CM

## 2020-09-21 DIAGNOSIS — W010XXA Fall on same level from slipping, tripping and stumbling without subsequent striking against object, initial encounter: Secondary | ICD-10-CM | POA: Diagnosis not present

## 2020-09-21 DIAGNOSIS — E785 Hyperlipidemia, unspecified: Secondary | ICD-10-CM | POA: Diagnosis present

## 2020-09-21 DIAGNOSIS — R2681 Unsteadiness on feet: Secondary | ICD-10-CM | POA: Diagnosis not present

## 2020-09-21 DIAGNOSIS — D649 Anemia, unspecified: Secondary | ICD-10-CM | POA: Diagnosis present

## 2020-09-21 DIAGNOSIS — Z7401 Bed confinement status: Secondary | ICD-10-CM | POA: Diagnosis not present

## 2020-09-21 DIAGNOSIS — S72002D Fracture of unspecified part of neck of left femur, subsequent encounter for closed fracture with routine healing: Secondary | ICD-10-CM | POA: Diagnosis not present

## 2020-09-21 DIAGNOSIS — Z4789 Encounter for other orthopedic aftercare: Secondary | ICD-10-CM | POA: Diagnosis not present

## 2020-09-21 DIAGNOSIS — Y92009 Unspecified place in unspecified non-institutional (private) residence as the place of occurrence of the external cause: Secondary | ICD-10-CM

## 2020-09-21 DIAGNOSIS — J302 Other seasonal allergic rhinitis: Secondary | ICD-10-CM | POA: Diagnosis not present

## 2020-09-21 DIAGNOSIS — D62 Acute posthemorrhagic anemia: Secondary | ICD-10-CM | POA: Diagnosis not present

## 2020-09-21 DIAGNOSIS — D509 Iron deficiency anemia, unspecified: Secondary | ICD-10-CM | POA: Diagnosis not present

## 2020-09-21 DIAGNOSIS — F339 Major depressive disorder, recurrent, unspecified: Secondary | ICD-10-CM | POA: Diagnosis not present

## 2020-09-21 DIAGNOSIS — M6281 Muscle weakness (generalized): Secondary | ICD-10-CM | POA: Diagnosis not present

## 2020-09-21 HISTORY — PX: HIP ARTHROPLASTY: SHX981

## 2020-09-21 LAB — CBC WITH DIFFERENTIAL/PLATELET
Abs Immature Granulocytes: 0.05 10*3/uL (ref 0.00–0.07)
Basophils Absolute: 0 10*3/uL (ref 0.0–0.1)
Basophils Relative: 0 %
Eosinophils Absolute: 0 10*3/uL (ref 0.0–0.5)
Eosinophils Relative: 0 %
HCT: 30.4 % — ABNORMAL LOW (ref 36.0–46.0)
Hemoglobin: 10.7 g/dL — ABNORMAL LOW (ref 12.0–15.0)
Immature Granulocytes: 1 %
Lymphocytes Relative: 17 %
Lymphs Abs: 1.5 10*3/uL (ref 0.7–4.0)
MCH: 32.5 pg (ref 26.0–34.0)
MCHC: 35.2 g/dL (ref 30.0–36.0)
MCV: 92.4 fL (ref 80.0–100.0)
Monocytes Absolute: 0.9 10*3/uL (ref 0.1–1.0)
Monocytes Relative: 10 %
Neutro Abs: 6.2 10*3/uL (ref 1.7–7.7)
Neutrophils Relative %: 72 %
Platelets: 258 10*3/uL (ref 150–400)
RBC: 3.29 MIL/uL — ABNORMAL LOW (ref 3.87–5.11)
RDW: 13.9 % (ref 11.5–15.5)
WBC: 8.6 10*3/uL (ref 4.0–10.5)
nRBC: 0 % (ref 0.0–0.2)

## 2020-09-21 LAB — BASIC METABOLIC PANEL
Anion gap: 9 (ref 5–15)
BUN: 24 mg/dL — ABNORMAL HIGH (ref 8–23)
CO2: 23 mmol/L (ref 22–32)
Calcium: 8.6 mg/dL — ABNORMAL LOW (ref 8.9–10.3)
Chloride: 103 mmol/L (ref 98–111)
Creatinine, Ser: 0.84 mg/dL (ref 0.44–1.00)
GFR, Estimated: >60 mL/min (ref >60)
Glucose, Bld: 118 mg/dL — ABNORMAL HIGH (ref 70–99)
Potassium: 4 mmol/L (ref 3.5–5.1)
Sodium: 135 mmol/L (ref 135–145)

## 2020-09-21 LAB — SURGICAL PCR SCREEN
MRSA, PCR: NEGATIVE
Staphylococcus aureus: NEGATIVE

## 2020-09-21 LAB — URINALYSIS, ROUTINE W REFLEX MICROSCOPIC
Bacteria, UA: NONE SEEN
Bilirubin Urine: NEGATIVE
Glucose, UA: NEGATIVE mg/dL
Hgb urine dipstick: NEGATIVE
Ketones, ur: 5 mg/dL — AB
Nitrite: NEGATIVE
Protein, ur: NEGATIVE mg/dL
Specific Gravity, Urine: 1.021 (ref 1.005–1.030)
pH: 5 (ref 5.0–8.0)

## 2020-09-21 LAB — TYPE AND SCREEN
ABO/RH(D): A POS
Antibody Screen: NEGATIVE

## 2020-09-21 LAB — CBC
HCT: 31.3 % — ABNORMAL LOW (ref 36.0–46.0)
Hemoglobin: 10.8 g/dL — ABNORMAL LOW (ref 12.0–15.0)
MCH: 32.1 pg (ref 26.0–34.0)
MCHC: 34.5 g/dL (ref 30.0–36.0)
MCV: 93.2 fL (ref 80.0–100.0)
Platelets: 274 10*3/uL (ref 150–400)
RBC: 3.36 MIL/uL — ABNORMAL LOW (ref 3.87–5.11)
RDW: 13.7 % (ref 11.5–15.5)
WBC: 14.8 10*3/uL — ABNORMAL HIGH (ref 4.0–10.5)
nRBC: 0 % (ref 0.0–0.2)

## 2020-09-21 LAB — HEPATIC FUNCTION PANEL
ALT: 13 U/L (ref 0–44)
AST: 43 U/L — ABNORMAL HIGH (ref 15–41)
Albumin: 3.3 g/dL — ABNORMAL LOW (ref 3.5–5.0)
Alkaline Phosphatase: 58 U/L (ref 38–126)
Bilirubin, Direct: 0.2 mg/dL (ref 0.0–0.2)
Indirect Bilirubin: 1.4 mg/dL — ABNORMAL HIGH (ref 0.3–0.9)
Total Bilirubin: 1.6 mg/dL — ABNORMAL HIGH (ref 0.3–1.2)
Total Protein: 6.6 g/dL (ref 6.5–8.1)

## 2020-09-21 LAB — PROTIME-INR
INR: 1.1 (ref 0.8–1.2)
Prothrombin Time: 14.1 seconds (ref 11.4–15.2)

## 2020-09-21 LAB — RESP PANEL BY RT-PCR (FLU A&B, COVID) ARPGX2
Influenza A by PCR: NEGATIVE
Influenza B by PCR: NEGATIVE
SARS Coronavirus 2 by RT PCR: NEGATIVE

## 2020-09-21 LAB — LIPID PANEL
Cholesterol: 177 mg/dL (ref 0–200)
HDL: 73 mg/dL (ref >40)
LDL Cholesterol: 93 mg/dL (ref 0–99)
Total CHOL/HDL Ratio: 2.4 RATIO
Triglycerides: 57 mg/dL (ref <150)
VLDL: 11 mg/dL (ref 0–40)

## 2020-09-21 SURGERY — HEMIARTHROPLASTY, HIP, DIRECT ANTERIOR APPROACH, FOR FRACTURE
Anesthesia: Spinal | Site: Hip | Laterality: Left

## 2020-09-21 MED ORDER — CLINDAMYCIN PHOSPHATE 600 MG/50ML IV SOLN
600.0000 mg | INTRAVENOUS | Status: AC
  Administered 2020-09-21: 600 mg via INTRAVENOUS
  Filled 2020-09-21: qty 50

## 2020-09-21 MED ORDER — MORPHINE SULFATE (PF) 4 MG/ML IV SOLN
4.0000 mg | Freq: Once | INTRAVENOUS | Status: AC
Start: 2020-09-21 — End: 2020-09-21
  Administered 2020-09-21: 4 mg via INTRAVENOUS
  Filled 2020-09-21: qty 1

## 2020-09-21 MED ORDER — METOCLOPRAMIDE HCL 10 MG PO TABS: 5.0000 mg | ORAL_TABLET | Freq: Three times a day (TID) | ORAL | Status: DC | PRN

## 2020-09-21 MED ORDER — SODIUM CHLORIDE 0.9 % IV SOLN
INTRAVENOUS | Status: DC | PRN
  Administered 2020-09-21: 20 ug/min via INTRAVENOUS

## 2020-09-21 MED ORDER — METOCLOPRAMIDE HCL 5 MG/ML IJ SOLN: 5.0000 mg | Freq: Three times a day (TID) | INTRAMUSCULAR | Status: DC | PRN

## 2020-09-21 MED ORDER — TRAZODONE HCL 50 MG PO TABS: 25.0000 mg | ORAL_TABLET | Freq: Every evening | ORAL | Status: DC | PRN

## 2020-09-21 MED ORDER — BUPIVACAINE HCL (PF) 0.5 % IJ SOLN
INTRAMUSCULAR | Status: DC | PRN
  Administered 2020-09-21: 2.5 mL

## 2020-09-21 MED ORDER — SODIUM CHLORIDE 0.9 % IV SOLN: INTRAVENOUS | Status: DC

## 2020-09-21 MED ORDER — MIDAZOLAM HCL 2 MG/2ML IJ SOLN
INTRAMUSCULAR | Status: AC
  Filled 2020-09-21: qty 2

## 2020-09-21 MED ORDER — METHOCARBAMOL 500 MG PO TABS
500.0000 mg | ORAL_TABLET | Freq: Four times a day (QID) | ORAL | Status: DC | PRN
  Administered 2020-09-21 – 2020-09-24 (×3): 500 mg via ORAL
  Filled 2020-09-21 (×3): qty 1

## 2020-09-21 MED ORDER — ONDANSETRON HCL 4 MG/2ML IJ SOLN
4.0000 mg | Freq: Four times a day (QID) | INTRAMUSCULAR | Status: DC | PRN
  Administered 2020-09-21: 4 mg via INTRAVENOUS
  Filled 2020-09-21: qty 2

## 2020-09-21 MED ORDER — CHLORHEXIDINE GLUCONATE 4 % EX LIQD: 1.0000 "application " | Freq: Once | CUTANEOUS | Status: DC

## 2020-09-21 MED ORDER — ACETAMINOPHEN 325 MG PO TABS
325.0000 mg | ORAL_TABLET | Freq: Four times a day (QID) | ORAL | Status: DC | PRN
  Administered 2020-09-22 – 2020-09-24 (×2): 650 mg via ORAL
  Filled 2020-09-21 (×3): qty 2

## 2020-09-21 MED ORDER — PHENOL 1.4 % MT LIQD
1.0000 | OROMUCOSAL | Status: DC | PRN
  Filled 2020-09-21: qty 177

## 2020-09-21 MED ORDER — TETANUS-DIPHTH-ACELL PERTUSSIS 5-2.5-18.5 LF-MCG/0.5 IM SUSY
0.5000 mL | PREFILLED_SYRINGE | Freq: Once | INTRAMUSCULAR | Status: AC
  Administered 2020-09-21: 0.5 mL via INTRAMUSCULAR
  Filled 2020-09-21: qty 0.5

## 2020-09-21 MED ORDER — MIDAZOLAM HCL 5 MG/5ML IJ SOLN
INTRAMUSCULAR | Status: DC | PRN
  Administered 2020-09-21: 1 mg via INTRAVENOUS

## 2020-09-21 MED ORDER — ALUM & MAG HYDROXIDE-SIMETH 200-200-20 MG/5ML PO SUSP: 30.0000 mL | ORAL | Status: DC | PRN

## 2020-09-21 MED ORDER — ONDANSETRON HCL 4 MG/2ML IJ SOLN
4.0000 mg | Freq: Once | INTRAMUSCULAR | Status: AC
  Administered 2020-09-21: 4 mg via INTRAVENOUS
  Filled 2020-09-21: qty 2

## 2020-09-21 MED ORDER — LACTATED RINGERS IV SOLN: INTRAVENOUS | Status: DC | PRN

## 2020-09-21 MED ORDER — PROPOFOL 500 MG/50ML IV EMUL
INTRAVENOUS | Status: AC
  Filled 2020-09-21: qty 50

## 2020-09-21 MED ORDER — OCUVITE-LUTEIN PO CAPS
ORAL_CAPSULE | Freq: Every day | ORAL | Status: DC
  Administered 2020-09-22 – 2020-09-24 (×3): 1 via ORAL
  Filled 2020-09-21 (×5): qty 1

## 2020-09-21 MED ORDER — ACETAMINOPHEN 10 MG/ML IV SOLN
INTRAVENOUS | Status: AC
  Filled 2020-09-21: qty 100

## 2020-09-21 MED ORDER — CEFAZOLIN SODIUM-DEXTROSE 2-4 GM/100ML-% IV SOLN
2.0000 g | INTRAVENOUS | Status: AC
  Administered 2020-09-21: 2 g via INTRAVENOUS
  Filled 2020-09-21 (×2): qty 100

## 2020-09-21 MED ORDER — TRANEXAMIC ACID-NACL 1000-0.7 MG/100ML-% IV SOLN
1000.0000 mg | Freq: Once | INTRAVENOUS | Status: DC
  Filled 2020-09-21: qty 100

## 2020-09-21 MED ORDER — ONDANSETRON HCL 4 MG PO TABS: 4.0000 mg | ORAL_TABLET | Freq: Four times a day (QID) | ORAL | Status: DC | PRN

## 2020-09-21 MED ORDER — FENTANYL CITRATE (PF) 100 MCG/2ML IJ SOLN
INTRAMUSCULAR | Status: DC | PRN
  Administered 2020-09-21: 30 ug via INTRAVENOUS

## 2020-09-21 MED ORDER — ACETAMINOPHEN 10 MG/ML IV SOLN
INTRAVENOUS | Status: DC | PRN
  Administered 2020-09-21: 1000 mg via INTRAVENOUS

## 2020-09-21 MED ORDER — VITAMIN B-12 100 MCG PO TABS
100.0000 ug | ORAL_TABLET | Freq: Every day | ORAL | Status: DC
  Administered 2020-09-22 – 2020-09-24 (×3): 100 ug via ORAL
  Filled 2020-09-21 (×4): qty 1

## 2020-09-21 MED ORDER — SODIUM CHLORIDE 0.45 % IV SOLN: INTRAVENOUS | Status: DC

## 2020-09-21 MED ORDER — SIMVASTATIN 20 MG PO TABS
10.0000 mg | ORAL_TABLET | Freq: Every day | ORAL | Status: DC
  Administered 2020-09-22 – 2020-09-24 (×3): 10 mg via ORAL
  Filled 2020-09-21 (×3): qty 1

## 2020-09-21 MED ORDER — ACETAMINOPHEN 325 MG PO TABS: 650.0000 mg | ORAL_TABLET | Freq: Four times a day (QID) | ORAL | Status: DC | PRN

## 2020-09-21 MED ORDER — ENOXAPARIN SODIUM 40 MG/0.4ML IJ SOSY
40.0000 mg | PREFILLED_SYRINGE | INTRAMUSCULAR | Status: DC
  Administered 2020-09-22 – 2020-09-24 (×3): 40 mg via SUBCUTANEOUS
  Filled 2020-09-21 (×3): qty 0.4

## 2020-09-21 MED ORDER — RASAGILINE MESYLATE 1 MG PO TABS
0.5000 mg | ORAL_TABLET | Freq: Every day | ORAL | Status: DC
  Filled 2020-09-21: qty 1

## 2020-09-21 MED ORDER — CLINDAMYCIN PHOSPHATE 600 MG/50ML IV SOLN
600.0000 mg | Freq: Three times a day (TID) | INTRAVENOUS | Status: AC
  Administered 2020-09-21 – 2020-09-22 (×3): 600 mg via INTRAVENOUS
  Filled 2020-09-21 (×3): qty 50

## 2020-09-21 MED ORDER — BUPIVACAINE HCL (PF) 0.5 % IJ SOLN
INTRAMUSCULAR | Status: AC
  Filled 2020-09-21: qty 10

## 2020-09-21 MED ORDER — PROPOFOL 10 MG/ML IV BOLUS
INTRAVENOUS | Status: DC | PRN
  Administered 2020-09-21 (×2): 20 mg via INTRAVENOUS

## 2020-09-21 MED ORDER — PROPOFOL 10 MG/ML IV BOLUS
INTRAVENOUS | Status: AC
  Filled 2020-09-21: qty 20

## 2020-09-21 MED ORDER — FENTANYL CITRATE (PF) 100 MCG/2ML IJ SOLN: 25.0000 ug | INTRAMUSCULAR | Status: DC | PRN

## 2020-09-21 MED ORDER — FENTANYL CITRATE (PF) 100 MCG/2ML IJ SOLN
50.0000 ug | Freq: Once | INTRAMUSCULAR | Status: AC
Start: 2020-09-21 — End: 2020-09-21
  Administered 2020-09-21: 50 ug via INTRAVENOUS
  Filled 2020-09-21: qty 2

## 2020-09-21 MED ORDER — FERROUS SULFATE 325 (65 FE) MG PO TABS
325.0000 mg | ORAL_TABLET | Freq: Every day | ORAL | Status: DC
  Administered 2020-09-22 – 2020-09-24 (×3): 325 mg via ORAL
  Filled 2020-09-21 (×3): qty 1

## 2020-09-21 MED ORDER — MENTHOL 3 MG MT LOZG
1.0000 | LOZENGE | OROMUCOSAL | Status: DC | PRN
  Filled 2020-09-21: qty 9

## 2020-09-21 MED ORDER — CARBIDOPA-LEVODOPA 25-100 MG PO TABS: 0.5000 | ORAL_TABLET | ORAL | Status: DC

## 2020-09-21 MED ORDER — MIRTAZAPINE 15 MG PO TABS
30.0000 mg | ORAL_TABLET | Freq: Every day | ORAL | Status: DC
  Administered 2020-09-21 – 2020-09-23 (×3): 30 mg via ORAL
  Filled 2020-09-21 (×3): qty 2

## 2020-09-21 MED ORDER — CEFAZOLIN SODIUM-DEXTROSE 2-4 GM/100ML-% IV SOLN
2.0000 g | Freq: Three times a day (TID) | INTRAVENOUS | Status: AC
  Administered 2020-09-21 – 2020-09-22 (×3): 2 g via INTRAVENOUS
  Filled 2020-09-21 (×3): qty 100

## 2020-09-21 MED ORDER — ENOXAPARIN SODIUM 30 MG/0.3ML IJ SOSY: 30.0000 mg | PREFILLED_SYRINGE | INTRAMUSCULAR | Status: DC

## 2020-09-21 MED ORDER — SALINE SPRAY 0.65 % NA SOLN
1.0000 | NASAL | Status: DC | PRN
  Filled 2020-09-21: qty 44

## 2020-09-21 MED ORDER — MORPHINE SULFATE (PF) 2 MG/ML IV SOLN: 0.5000 mg | INTRAVENOUS | Status: DC | PRN

## 2020-09-21 MED ORDER — ONDANSETRON HCL 4 MG/2ML IJ SOLN
INTRAMUSCULAR | Status: DC | PRN
  Administered 2020-09-21: 4 mg via INTRAVENOUS

## 2020-09-21 MED ORDER — DOCUSATE SODIUM 100 MG PO CAPS
100.0000 mg | ORAL_CAPSULE | Freq: Two times a day (BID) | ORAL | Status: DC
  Administered 2020-09-21 – 2020-09-24 (×7): 100 mg via ORAL
  Filled 2020-09-21 (×7): qty 1

## 2020-09-21 MED ORDER — MORPHINE SULFATE (PF) 2 MG/ML IV SOLN
1.0000 mg | INTRAVENOUS | Status: DC | PRN
  Administered 2020-09-21: 1 mg via INTRAVENOUS
  Filled 2020-09-21: qty 1

## 2020-09-21 MED ORDER — CARBIDOPA-LEVODOPA 25-100 MG PO TABS
1.0000 | ORAL_TABLET | Freq: Every day | ORAL | Status: DC
  Administered 2020-09-21 – 2020-09-24 (×18): 1 via ORAL
  Filled 2020-09-21 (×18): qty 1

## 2020-09-21 MED ORDER — FENTANYL CITRATE (PF) 100 MCG/2ML IJ SOLN
INTRAMUSCULAR | Status: AC
  Filled 2020-09-21: qty 2

## 2020-09-21 MED ORDER — ZOLPIDEM TARTRATE 5 MG PO TABS: 5.0000 mg | ORAL_TABLET | Freq: Every evening | ORAL | Status: DC | PRN

## 2020-09-21 MED ORDER — CHLORHEXIDINE GLUCONATE CLOTH 2 % EX PADS
6.0000 | MEDICATED_PAD | Freq: Every day | CUTANEOUS | Status: DC
  Administered 2020-09-22: 6 via TOPICAL

## 2020-09-21 MED ORDER — HYDROCODONE-ACETAMINOPHEN 5-325 MG PO TABS
1.0000 | ORAL_TABLET | ORAL | Status: DC | PRN
  Administered 2020-09-21 – 2020-09-24 (×5): 1 via ORAL
  Filled 2020-09-21 (×5): qty 1
  Filled 2020-09-21: qty 2

## 2020-09-21 MED ORDER — BISACODYL 10 MG RE SUPP
10.0000 mg | Freq: Every day | RECTAL | Status: DC | PRN
  Administered 2020-09-24: 10 mg via RECTAL
  Filled 2020-09-21: qty 1

## 2020-09-21 MED ORDER — ACETAMINOPHEN 650 MG RE SUPP: 650.0000 mg | Freq: Four times a day (QID) | RECTAL | Status: DC | PRN

## 2020-09-21 MED ORDER — FLUTICASONE PROPIONATE 50 MCG/ACT NA SUSP
2.0000 | Freq: Every day | NASAL | Status: DC | PRN
  Filled 2020-09-21: qty 16

## 2020-09-21 MED ORDER — LORATADINE 10 MG PO TABS: 10.0000 mg | ORAL_TABLET | Freq: Every day | ORAL | Status: DC | PRN

## 2020-09-21 MED ORDER — MAGNESIUM HYDROXIDE 400 MG/5ML PO SUSP
30.0000 mL | Freq: Every day | ORAL | Status: DC | PRN
  Administered 2020-09-22 – 2020-09-24 (×2): 30 mL via ORAL
  Filled 2020-09-21 (×2): qty 30

## 2020-09-21 MED ORDER — ONDANSETRON HCL 4 MG/2ML IJ SOLN: 4.0000 mg | Freq: Once | INTRAMUSCULAR | Status: DC | PRN

## 2020-09-21 MED ORDER — MUPIROCIN 2 % EX OINT
1.0000 "application " | TOPICAL_OINTMENT | Freq: Two times a day (BID) | CUTANEOUS | Status: DC
  Administered 2020-09-21 – 2020-09-24 (×4): 1 via NASAL
  Filled 2020-09-21: qty 22

## 2020-09-21 MED ORDER — PROPOFOL 500 MG/50ML IV EMUL
INTRAVENOUS | Status: DC | PRN
  Administered 2020-09-21: 40 ug/kg/min via INTRAVENOUS

## 2020-09-21 MED ORDER — SODIUM CHLORIDE 0.9 % IV SOLN: INTRAVENOUS | Status: AC

## 2020-09-21 MED ORDER — EPHEDRINE SULFATE 50 MG/ML IJ SOLN
INTRAMUSCULAR | Status: DC | PRN
  Administered 2020-09-21: 10 mg via INTRAVENOUS
  Administered 2020-09-21: 5 mg via INTRAVENOUS

## 2020-09-21 MED ORDER — BUPIVACAINE-EPINEPHRINE (PF) 0.25% -1:200000 IJ SOLN
INTRAMUSCULAR | Status: DC | PRN
  Administered 2020-09-21: 30 mL via PERINEURAL

## 2020-09-21 MED ORDER — METHOCARBAMOL 1000 MG/10ML IJ SOLN
500.0000 mg | Freq: Four times a day (QID) | INTRAMUSCULAR | Status: DC | PRN
  Filled 2020-09-21: qty 5

## 2020-09-21 MED ORDER — RASAGILINE MESYLATE 1 MG PO TABS
1.0000 mg | ORAL_TABLET | Freq: Every day | ORAL | Status: DC
  Administered 2020-09-21 – 2020-09-24 (×4): 1 mg via ORAL
  Filled 2020-09-21 (×3): qty 1

## 2020-09-21 SURGICAL SUPPLY — 59 items
APL PRP STRL LF DISP 70% ISPRP (MISCELLANEOUS) ×3
BLADE DEBAKEY 8.0 (BLADE) ×2 IMPLANT
BLADE SAGITTAL WIDE XTHICK NO (BLADE) ×2 IMPLANT
BLADE SURG SZ10 CARB STEEL (BLADE) ×2 IMPLANT
CHLORAPREP W/TINT 26 (MISCELLANEOUS) ×5 IMPLANT
COVER BACK TABLE REUSABLE LG (DRAPES) ×2 IMPLANT
COVER WAND RF STERILE (DRAPES) ×2 IMPLANT
DRAPE INCISE IOBAN 66X60 STRL (DRAPES) ×4 IMPLANT
DRSG AQUACEL AG ADV 3.5X10 (GAUZE/BANDAGES/DRESSINGS) ×2 IMPLANT
DRSG AQUACEL AG ADV 3.5X14 (GAUZE/BANDAGES/DRESSINGS) ×2 IMPLANT
ELECT BLADE 6.5 EXT (BLADE) ×2 IMPLANT
ELECT CAUTERY BLADE 6.4 (BLADE) ×2 IMPLANT
ELECT REM PT RETURN 9FT ADLT (ELECTROSURGICAL) ×2
ELECTRODE REM PT RTRN 9FT ADLT (ELECTROSURGICAL) ×1 IMPLANT
GAUZE SPONGE 4X4 12PLY STRL (GAUZE/BANDAGES/DRESSINGS) ×2 IMPLANT
GAUZE XEROFORM 1X8 LF (GAUZE/BANDAGES/DRESSINGS) ×4 IMPLANT
GLOVE SURG ORTHO LTX SZ8.5 (GLOVE) ×2 IMPLANT
GLOVE SURG UNDER LTX SZ8 (GLOVE) ×2 IMPLANT
GOWN STRL REUS W/ TWL LRG LVL3 (GOWN DISPOSABLE) ×2 IMPLANT
GOWN STRL REUS W/TWL LRG LVL3 (GOWN DISPOSABLE) ×4
GOWN STRL REUS W/TWL LRG LVL4 (GOWN DISPOSABLE) ×2 IMPLANT
HEAD MODULAR ENDO (Orthopedic Implant) ×2 IMPLANT
HEAD UNPLR 47XMDLR STRL HIP (Orthopedic Implant) IMPLANT
HEMOVAC 400CC 10FR (MISCELLANEOUS) ×2 IMPLANT
IV NS 1000ML (IV SOLUTION) ×2
IV NS 1000ML BAXH (IV SOLUTION) ×1 IMPLANT
KIT TURNOVER KIT A (KITS) ×2 IMPLANT
MANIFOLD NEPTUNE II (INSTRUMENTS) ×2 IMPLANT
NDL FILTER BLUNT 18X1 1/2 (NEEDLE) ×1 IMPLANT
NDL MAYO CATGUT SZ4 TPR NDL (NEEDLE) ×1 IMPLANT
NDL SPNL 18GX3.5 QUINCKE PK (NEEDLE) ×2 IMPLANT
NEEDLE FILTER BLUNT 18X 1/2SAF (NEEDLE) ×1
NEEDLE FILTER BLUNT 18X1 1/2 (NEEDLE) ×1 IMPLANT
NEEDLE MAYO CATGUT SZ4 (NEEDLE) ×2 IMPLANT
NEEDLE SPNL 18GX3.5 QUINCKE PK (NEEDLE) ×4 IMPLANT
NS IRRIG 1000ML POUR BTL (IV SOLUTION) ×2 IMPLANT
PACK HIP PROSTHESIS (MISCELLANEOUS) ×2 IMPLANT
PAD ABD DERMACEA PRESS 5X9 (GAUZE/BANDAGES/DRESSINGS) ×3 IMPLANT
PILLOW ABDUCTION FOAM SM (MISCELLANEOUS) ×1 IMPLANT
PULSAVAC PLUS IRRIG FAN TIP (DISPOSABLE) ×2
SLEEVE UNITRAX V40 (Orthopedic Implant) ×2 IMPLANT
SLEEVE UNITRAX V40 +4 (Orthopedic Implant) IMPLANT
SOL PREP PVP 2OZ (MISCELLANEOUS) ×2
SOLUTION PREP PVP 2OZ (MISCELLANEOUS) ×1 IMPLANT
STAPLER SKIN PROX 35W (STAPLE) ×2 IMPLANT
STEM FEM ACCOLADE 38X102X30 S3 (Stem) ×1 IMPLANT
SUT DVC 2 QUILL PDO  T11 36X36 (SUTURE) ×2
SUT DVC 2 QUILL PDO T11 36X36 (SUTURE) ×2 IMPLANT
SUT QUILL PDO 0 36 36 VIOLET (SUTURE) ×2 IMPLANT
SUT TICRON 2-0 30IN 311381 (SUTURE) ×14 IMPLANT
SYR 10ML LL (SYRINGE) ×2 IMPLANT
SYR 30ML LL (SYRINGE) ×2 IMPLANT
SYR 50ML LL SCALE MARK (SYRINGE) ×2 IMPLANT
TAPE MICROFOAM 4IN (TAPE) ×2 IMPLANT
TIP FAN IRRIG PULSAVAC PLUS (DISPOSABLE) ×1 IMPLANT
TRAY FOLEY MTR SLVR 16FR STAT (SET/KITS/TRAYS/PACK) ×1 IMPLANT
TUBE SUCT KAM VAC (TUBING) ×2 IMPLANT
TUBING CONNECTING 10 (TUBING) ×1 IMPLANT
WATER STERILE IRR 1000ML POUR (IV SOLUTION) ×2 IMPLANT

## 2020-09-21 NOTE — ED Triage Notes (Signed)
Pt presents to ER from home.  Pt states she had fall around 7pm tonight and fell on her left side and is having pain to left upper leg. Pt has hx of parkinsons disease and has noted involuntary movement.  Pt states pain is okay when she relaxes, but then the pain "grabs."  Pt unable to move leg without extreme pain.  Leg appears somewhat swollen.  No obvious deformity.

## 2020-09-21 NOTE — Anesthesia Preprocedure Evaluation (Addendum)
Anesthesia Evaluation    Airway Mallampati: II  TM Distance: >3 FB Neck ROM: Full    Dental  (+) Teeth Intact   Pulmonary asthma , Current Smoker,           Cardiovascular  Rhythm:Regular - Systolic murmurs    Neuro/Psych Parkinson's disease on medication  Neuromuscular disease    GI/Hepatic   Endo/Other    Renal/GU      Musculoskeletal  (+) Arthritis , Recent trauma- Pt fell 6/3 around 7pm onto left hip and has fracture of proximal femur   Abdominal   Peds  Hematology   Anesthesia Other Findings   Reproductive/Obstetrics                            Anesthesia Physical Anesthesia Plan  ASA: II  Anesthesia Plan: Spinal   Post-op Pain Management:    Induction: Intravenous  PONV Risk Score and Plan: 1 and Ondansetron  Airway Management Planned: Simple Face Mask  Additional Equipment:   Intra-op Plan:   Post-operative Plan:   Informed Consent:   Plan Discussed with:   Anesthesia Plan Comments:         Anesthesia Quick Evaluation

## 2020-09-21 NOTE — Anesthesia Postprocedure Evaluation (Signed)
Anesthesia Post Note  Patient: Jasmine Colon  Procedure(s) Performed: ARTHROPLASTY BIPOLAR HIP (HEMIARTHROPLASTY) (Left Hip)  Patient location during evaluation: Nursing Unit Anesthesia Type: Spinal Level of consciousness: awake and alert Pain management: pain level controlled Vital Signs Assessment: post-procedure vital signs reviewed and stable Respiratory status: spontaneous breathing Cardiovascular status: blood pressure returned to baseline Postop Assessment: no headache Anesthetic complications: no Comments: Pt doing as well as expected.  Daughter at bedside and says her Parkinson's is acting up a little, but she hasn't had her nightly medications.  Pt alert and no issues with anesthesia.   No complications documented.   Last Vitals:  Vitals:   09/21/20 1625 09/21/20 1830  BP: 95/63   Pulse: 92   Resp: 18   Temp: 36.9 C 36.6 C  SpO2: 94%     Last Pain:  Vitals:   09/21/20 1445  TempSrc: Oral  PainSc:                  Harrie Foreman

## 2020-09-21 NOTE — H&P (Signed)
[place]   PATIENT NAME: Jasmine Colon    MR#:  [id number]  DATE OF BIRTH:  1940/12/06  DATE OF ADMISSION:  09/21/2020  PRIMARY CARE PHYSICIAN: Dion Body, MD   Patient is coming from: Home  REQUESTING/REFERRING PHYSICIAN: Ward, Cyril Mourning, DO  CHIEF COMPLAINT:   Chief Complaint  Patient presents with  . Fall  . Leg Pain    HISTORY OF PRESENT ILLNESS:  Luzmaria Devaux is a 80 y.o. Caucasian female with medical history significant for asthma, dyslipidemia, osteoarthritis, osteoporosis, Parkinson's disease and right breast cancer status postlumpectomy and radiotherapy, who presented to the emergency room with acute onset of accidental mechanical fall at 7 PM and subsequent Left hip pain.  The patient apparently lost her balance and went backwards on the left side.  She denies any head injuries or neck pain.  No chest pain or palpitations.  She has occasional dyspnea that is likely associated with nasal congestion with her chronic rhinitis but denied any cough or wheezing or recent asthma exacerbation.  No paresthesias or focal muscle weakness.  She denied any presyncope or syncope.  No headache or dizziness or blurred vision.  No nausea or vomiting or abdominal pain.  No dysuria, oliguria or hematuria or flank pain. ED Course: Upon presentation to the ER blood pressure was 97/56 and later 142/90 and pulse oximetry was 91% then 88% on room air and 97% on 2 L of O2 by nasal cannula.  Heart rate was 101 and later 83.  Labs revealed leukocytosis of 14.8 and anemia compared to 2013.  Urinalysis was unremarkable.  Influenza antigens and COVID-19 PCR came back negative EKG as reviewed by me : Showed normal sinus rhythm with rate of 88 with short PR interval and poor R wave progression. Imaging: Chest x-ray showed no acute cardiopulmonary disease. Left hip and pelvic x-ray showed 1. Poorly visualized fractured and displaced left femoral neck. Recommend CT noncontrast for further  evaluation. 2. No acute displaced fracture of the distal left femur. 3. No acute displaced fracture or diastasis of the bones of the pelvis. 4. Partially visualized frontal view of the right hip grossly unremarkable. 5. Tricompartmental degenerative changes of the left knee.  Patient was given IV morphine sulfate twice, IV Zofran, IV fentanyl and Tdap booster.  She was ordered IV Ancef on-call to the OR.  Dr. Sabra Heck was notified about the patient and is planning for operative intervention later this morning.  The patient will be admitted to a medical bed for further evaluation and management. PAST MEDICAL HISTORY:   Past Medical History:  Diagnosis Date  . Asthma 2012  . Hyperlipidemia   . Lesion of lateral popliteal nerve   . Lower back pain   . 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 Mitral valve disorder   . Osteoarthritis   . Osteoporosis   . Parkinson's disease ([place]) 2012  . Personal history of radiation therapy 2013   right breast ca with lumpectomy and rad tx    PAST SURGICAL HISTORY:   Past Surgical History:  Procedure Laterality Date  . BREAST BIOPSY Right 10/26/2011   invasive mammary carcinoma and DCIS  . BREAST BIOPSY Left 01/09/2014   retroaerolar bx with Dr. Jamal Collin. Benign  . BREAST LUMPECTOMY Right 11/13/2011   invasive mammary ca, DCIS, LCIS in specimen. Clear margins.  LN negative  . BREAST SURGERY Right 2013  wide excision  . CESAREAN SECTION  31 years ago  . COLONOSCOPY  2008    SOCIAL HISTORY:   Social History   Tobacco Use  . Smoking status: Current Every Day Smoker    Packs/day: 0.50    Years: 45.00    Pack years: 22.50    Types: Cigarettes  . Smokeless tobacco: Never Used  Substance Use Topics  . Alcohol use: No    FAMILY HISTORY:   Family History  Problem Relation Age of Onset  . Stroke Mother   . Heart failure Father   . Heart disease Brother         bypass  . Cancer Maternal Aunt        breast   . Breast cancer Maternal Aunt   . Cancer Maternal Aunt        breast  . Breast cancer Maternal Aunt     DRUG ALLERGIES:   Allergies  Allergen Reactions  . Codeine     Feels weird  . Pseudoephedrine     Other reaction(s): Dizziness and giddiness (finding)    REVIEW OF SYSTEMS:   ROS As per history of present illness. All pertinent systems were reviewed above. Constitutional, HEENT, cardiovascular, respiratory, GI, GU, musculoskeletal, neuro, psychiatric, endocrine, integumentary and hematologic systems were reviewed and are otherwise negative/unremarkable except for positive findings mentioned above in the HPI.   MEDICATIONS AT HOME:   Prior to Admission medications   Medication Sig Start Date End Date Taking? Authorizing Provider  amoxicillin (AMOXIL) 500 MG capsule Take 500 mg by mouth every 8 (eight) hours. 09/09/20  Yes [provider]  Calcium Carbonate-Vitamin D (CALTRATE 600+D PO) Take 3 tablets by mouth 3 (three) times daily.    Yes [provider]  carbidopa-levodopa (SINEMET IR) 25-100 MG per tablet TAKE 0.5 TABLETS BY MOUTH 3 (THREE) TIMES DAILY. Patient taking differently: Take 0.5 tablets by mouth See admin instructions. Pt daughter states pt takes every 3-4 hours 12/21/13  Yes Star Age, MD  cyanocobalamin 1000 MCG tablet Take 100 mcg by mouth daily.   Yes [provider]  fluticasone (FLONASE) 50 MCG/ACT nasal spray Place 2 sprays into both nostrils daily as needed for allergies or rhinitis.   Yes [provider]  Ibuprofen 200 MG CAPS Take 400 mg by mouth every 8 (eight) hours as needed.   Yes [provider]  loratadine (CLARITIN) 10 MG tablet Take 10 mg by mouth daily as needed for allergies.   Yes [provider]  mirtazapine (REMERON) 30 MG tablet Take 30 mg by mouth at bedtime. 06/27/20  Yes [provider]  Multiple Vitamins-Minerals  (PRESERVISION AREDS PO) Take by mouth daily.   Yes [provider]  rasagiline (AZILECT) 0.5 MG TABS tablet Take 0.5 mg by mouth daily.   Yes [provider]  sodium chloride (OCEAN) 0.65 % SOLN nasal spray Place 1 spray into both nostrils as needed for congestion.   Yes [provider]  aspirin EC 81 MG tablet Take 81 mg by mouth daily. Patient not taking: Reported on 09/21/2020    [provider]  ibandronate (BONIVA) 150 MG tablet Take 1 tablet by mouth every 30 (thirty) days. Patient not taking: Reported on 09/21/2020 11/24/12   [provider]  simvastatin (ZOCOR) 20 MG tablet Take 10 mg by mouth daily.  Patient not taking: Reported on 09/21/2020    [provider]      VITAL SIGNS:  Blood pressure 133/78, pulse 80,  temperature (!) 97.5 F (36.4 C), temperature source Oral, resp. rate 15, height 5\' 5"  (1.651 m), weight 51.6 kg, SpO2 98 %.  PHYSICAL EXAMINATION:  Physical Exam  GENERAL:  80 y.o.-year-old Caucasian female patient lying in the bed with no acute distress.  EYES: Pupils equal, round, reactive to light and accommodation. No scleral icterus. Extraocular muscles intact.  HEENT: Head atraumatic, normocephalic. Oropharynx and nasopharynx clear.  NECK:  Supple, no jugular venous distention. No thyroid enlargement, no tenderness.  LUNGS: Normal breath sounds bilaterally, no wheezing, rales,rhonchi or crepitation. No use of accessory muscles of respiration.  CARDIOVASCULAR: Regular rate and rhythm, S1, S2 normal. No murmurs, rubs, or gallops.  ABDOMEN: Soft, nondistended, nontender. Bowel sounds present. No organomegaly or mass.  EXTREMITIES: No pedal edema, cyanosis, or clubbing.  NEUROLOGIC: Cranial nerves II through XII are intact. Muscle strength 5/5 in all extremities. Sensation intact. Gait not checked. Musculoskeletal: Left hip tenderness PSYCHIATRIC: The patient is alert and oriented x 3.  Normal affect and good eye  contact. SKIN: No obvious rash, lesion, or ulcer.   LABORATORY PANEL:   CBC Recent Labs  Lab 09/21/20 0153  WBC 14.8*  HGB 10.8*  HCT 31.3*  PLT 274   ------------------------------------------------------------------------------------------------------------------  Chemistries  Recent Labs  Lab 09/21/20 0153  NA 135  K 4.0  CL 103  CO2 23  GLUCOSE 118*  BUN 24*  CREATININE 0.84  CALCIUM 8.6*   ------------------------------------------------------------------------------------------------------------------  Cardiac Enzymes No results for input(s): TROPONINI in the last 168 hours. ------------------------------------------------------------------------------------------------------------------  RADIOLOGY:  DG Chest 1 View  Result Date: 09/21/2020 CLINICAL DATA:  Fall, left chest pain EXAM: CHEST  1 VIEW COMPARISON:  None. FINDINGS: Lungs are well expanded, symmetric, and clear. No pneumothorax or pleural effusion. Cardiac size within normal limits. Pulmonary vascularity is normal. Osseous structures are age-appropriate. Mild thoracic sigmoid scoliosis noted. No acute bone abnormality. IMPRESSION: No active disease. Electronically Signed   By: Fidela Salisbury MD   On: 09/21/2020 01:13   DG Hip Unilat W or Wo Pelvis 2-3 Views Left  Result Date: 09/21/2020 CLINICAL DATA:  Pt states she had fall around 7pm tonight and fell on her left side and is having pain to left upper leg. Pt has hx of parkinsons disease and has noted involuntary movement. EXAM: LEFT FEMUR PORTABLE 2 VIEWS; DG HIP (WITH OR WITHOUT PELVIS) 2-3V LEFT COMPARISON:  None. FINDINGS: Otherwise no acute displaced fracture of the bones of the pelvis. No pelvic diastasis. Poorly visualized fractured and displaced left femoral neck. The distal left femur demonstrates no acute displaced fracture. Visualized portions of the left knee demonstrate tricompartmental degenerative changes in likely a small joint effusion. Frontal  view of the visualized right hip grossly unremarkable. Visualized lumbar spine demonstrates degenerative changes. IMPRESSION: 1. Poorly visualized fractured and displaced left femoral neck. Recommend CT noncontrast for further evaluation. 2. No acute displaced fracture of the distal left femur. 3. No acute displaced fracture or diastasis of the bones of the pelvis. 4. Partially visualized frontal view of the right hip grossly unremarkable. 5. Tricompartmental degenerative changes of the left knee. Electronically Signed   By: Iven Finn M.D.   On: 09/21/2020 01:12   DG FEMUR PORT MIN 2 VIEWS LEFT  Result Date: 09/21/2020 CLINICAL DATA:  Pt states she had fall around 7pm tonight and fell on her left side and is having pain to left upper leg. Pt has hx of parkinsons disease and has noted involuntary movement. EXAM: LEFT FEMUR PORTABLE 2 VIEWS;  DG HIP (WITH OR WITHOUT PELVIS) 2-3V LEFT COMPARISON:  None. FINDINGS: Otherwise no acute displaced fracture of the bones of the pelvis. No pelvic diastasis. Poorly visualized fractured and displaced left femoral neck. The distal left femur demonstrates no acute displaced fracture. Visualized portions of the left knee demonstrate tricompartmental degenerative changes in likely a small joint effusion. Frontal view of the visualized right hip grossly unremarkable. Visualized lumbar spine demonstrates degenerative changes. IMPRESSION: 1. Poorly visualized fractured and displaced left femoral neck. Recommend CT noncontrast for further evaluation. 2. No acute displaced fracture of the distal left femur. 3. No acute displaced fracture or diastasis of the bones of the pelvis. 4. Partially visualized frontal view of the right hip grossly unremarkable. 5. Tricompartmental degenerative changes of the left knee. Electronically Signed   By: Iven Finn M.D.   On: 09/21/2020 01:12      IMPRESSION AND PLAN:  Active Problems:   Closed left hip fracture (HCC)  1.  Closed  left hip fracture. - The patient will be admitted to a medical bed. - Pain management will be provided. - Aspirin will be held off. - The patient will be kept NPO. - Orthopedic consultation will be obtained. - Dr. Sabra Heck was notified and is aware about the patient. - The patient has no history of CHF, coronary artery disease, diabetes mellitus on insulin, renal failure with a creatinine more than 2, or CVA.  She is considered at average risk for her age for perioperative cardiovascular events per the  Revised cardiac risk index.    2.  Asthma with mild hypoxia without acute exacerbation. - She will be placed on scheduled and as needed DuoNebs. - O2 protocol will be followed given suboptimal pulse oximetry.   - She has no other current pulmonary issues.  3.  Leukocytosis. - This likely secondary to stress demargination. - CBC will be followed. - Her chest x-ray and urinalysis were unremarkable.  4.  History of dyslipidemia. - The patient is not currently on simvastatin. - We will check LFTs and resume it for now for perioperative cardiovascular risk reduction.  5.  Parkinson's disease. - We will resume her Sinemet and Azilect.  6.  Tobacco abuse. - We will utilize NicoDerm CQ patch only as needed. - She was counseled for smoking cessation and will receive further counseling here.  7.  Chronic rhinitis. - We will continue Flonase nasal spray as well as place her on Claritin.  DVT prophylaxis: SCDs.  Medical prophylaxis is postponed till postoperative period Code Status: The patient is DNR/DNI.  This was discussed with her and her daughter who was with her in the room.  DO NOT INTUBATE can be temporarily withheld for surgery. Family Communication:  The plan of care was discussed in details with the patient (and her daughter who was with her in the room y). I answered all questions. The patient agreed to proceed with the above mentioned plan. Further management will depend upon  hospital course. Disposition Plan: Back to previous home environment Consults called: Orthopedic consult. All the records are reviewed and case discussed with ED provider.  Status is: Inpatient  Remains inpatient appropriate because:Ongoing active pain requiring inpatient pain management, Ongoing diagnostic testing needed not appropriate for outpatient work up, Unsafe d/c plan, IV treatments appropriate due to intensity of illness or inability to take PO and Inpatient level of care appropriate due to severity of illness   Dispo: The patient is from: Home  Anticipated d/c is to: SNF              Patient currently is not medically stable to d/c.   Difficult to place patient No   TOTAL TIME TAKING CARE OF THIS PATIENT: 55 minutes.    Christel Mormon M.D on 09/21/2020 at 7:05 AM  [facility]   From 7 PM-7 AM, contact night-coverage [email or web address]  CC: Primary care physician; Dion Body, MD

## 2020-09-21 NOTE — ED Provider Notes (Signed)
[facility] Emergency Department Provider Note ____________________________________________   Event Date/Time   First MD Initiated Contact with Patient 09/21/20 [phone]     (approximate)  I have reviewed the triage vital signs and the nursing notes.   HISTORY  Chief Complaint Fall and Leg Pain    HPI Jasmine Colon is a 80 y.o. female with history of Parkinson's disease, hyperlipidemia, asthma who presents to the emergency department her daughter after she had a mechanical fall around 7 PM tonight.  States that she lost her balance and went backwards landing on her left side.  Complains of left hip pain.  No head injury.  Not on blood thinners.  Denies neck or back pain.  Denies numbness or weakness.  No preceding symptoms that led to her fall.  Ambulates at home without assistive devices.         Past Medical History:  Diagnosis Date  . Asthma 2012  . Hyperlipidemia   . Lesion of lateral popliteal nerve   . Lower back pain   . 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 Mitral valve disorder   . Osteoarthritis   . Osteoporosis   . Parkinson's disease ([place]) 2012  . Personal history of radiation therapy 2013   right breast ca with lumpectomy and rad tx    Patient Active Problem List   Diagnosis Date Noted  . Breast cancer ([place])   . Malignant neoplasm of upper-outer quadrant of female breast ([place]) 06/20/2013  . 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 Hip pain, left 07/19/2012  . Back pain, lumbar 07/19/2012  . Osteoporosis, unspecified 07/19/2012  . 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  . Parkinson's disease ([place]) 07/19/2012    Past Surgical History:  Procedure Laterality Date  . BREAST BIOPSY Right 10/26/2011   invasive mammary carcinoma and DCIS  . BREAST BIOPSY Left 01/09/2014   retroaerolar bx with Dr. Jamal Collin. Benign  . BREAST LUMPECTOMY  Right 11/13/2011   invasive mammary ca, DCIS, LCIS in specimen. Clear margins.  LN negative  . BREAST SURGERY Right 2013   wide excision  . CESAREAN SECTION  31 years ago  . COLONOSCOPY  2008    Prior to Admission medications   Medication Sig Start Date End Date Taking? Authorizing Provider  aspirin EC 81 MG tablet Take 81 mg by mouth daily.    [provider]  Calcium Carbonate-Vitamin D (CALTRATE 600+D PO) Take 3 tablets by mouth 3 (three) times daily.     [provider]  carbidopa-levodopa (SINEMET IR) 25-100 MG per tablet TAKE 0.5 TABLETS BY MOUTH 3 (THREE) TIMES DAILY. 12/21/13   Star Age, MD  ibandronate (BONIVA) 150 MG tablet Take 1 tablet by mouth every 30 (thirty) days. 11/24/12   [provider]  Ibuprofen (ADVIL) 200 MG CAPS Take 200 mg by mouth as needed.    [provider]  Multiple Vitamins-Minerals (PRESERVISION AREDS PO) Take by mouth daily.    [provider]  rasagiline (AZILECT) 0.5 MG TABS tablet Take 0.5 mg by mouth daily.    [provider]  simvastatin (ZOCOR) 20 MG tablet Take 10 mg by mouth daily.     [provider]    Allergies Codeine and Pseudoephedrine  Family History  Problem Relation Age of Onset  . Stroke Mother   . Heart failure Father   . Heart disease Brother  bypass  . Cancer Maternal Aunt        breast   . Breast cancer Maternal Aunt   . Cancer Maternal Aunt        breast  . Breast cancer Maternal Aunt     Social History Social History   Tobacco Use  . Smoking status: Current Every Day Smoker    Packs/day: 0.50    Years: 45.00    Pack years: 22.50    Types: Cigarettes  . Smokeless tobacco: Never Used  Substance Use Topics  . Alcohol use: No  . Drug use: No    Review of Systems Constitutional: No fever. Eyes: No visual changes. ENT: No sore throat. Cardiovascular: Denies chest pain. Respiratory: Denies shortness of breath. Gastrointestinal: No nausea,  vomiting, diarrhea. Genitourinary: Negative for dysuria. Musculoskeletal: Negative for back pain. Skin: Negative for rash. Neurological: Negative for focal weakness or numbness.   ____________________________________________   PHYSICAL EXAM:  VITAL SIGNS: ED Triage Vitals  Enc Vitals Group     BP 09/21/20 0011 (!) 97/56     Pulse Rate 09/21/20 0011 (!) 101     Resp 09/21/20 0011 18     Temp 09/21/20 0011 (!) 97.5 F (36.4 C)     Temp Source 09/21/20 0011 Oral     SpO2 09/21/20 0011 91 %     Weight 09/21/20 0013 110 lb (49.9 kg)     Height 09/21/20 0013 5\' 5"  (1.651 m)     Head Circumference --      Peak Flow --      Pain Score 09/21/20 0013 10     Pain Loc --      Pain Edu? --      Excl. in [place]? --    CONSTITUTIONAL: Alert and oriented and responds appropriately to questions. Well-appearing; well-nourished; GCS 15 HEAD: Normocephalic; atraumatic EYES: Conjunctivae clear, PERRL, EOMI ENT: normal nose; no rhinorrhea; moist mucous membranes; pharynx without lesions noted; no dental injury; no septal hematoma NECK: Supple, no meningismus, no LAD; no midline spinal tenderness, step-off or deformity; trachea midline CARD: RRR; S1 and S2 appreciated; no murmurs, no clicks, no rubs, no gallops RESP: Normal chest excursion without splinting or tachypnea; breath sounds clear and equal bilaterally; no wheezes, no rhonchi, no rales; no hypoxia or respiratory distress CHEST:  chest wall stable, no crepitus or ecchymosis or deformity, nontender to palpation; no flail chest ABD/GI: Normal bowel sounds; non-distended; soft, non-tender, no rebound, no guarding; no ecchymosis or other lesions noted PELVIS:  stable, nontender to palpation BACK:  The back appears normal and is non-tender to palpation, there is no CVA tenderness; no midline spinal tenderness, step-off or deformity EXT: Left leg is shortened and externally rotated.  2+ DP pulses bilaterally.  Abrasions noted to bilateral knees and  left dorsal hand without bony tenderness, deformity or joint effusion.  Compartments soft. SKIN: Normal color for age and race; warm NEURO: Moves all extremities equally, involuntary movements of her lower extremities, normal speech, no facial asymmetry PSYCH: The patient's mood and manner are appropriate. Grooming and personal hygiene are appropriate.  ____________________________________________   LABS (all labs ordered are listed, but only abnormal results are displayed)  Labs Reviewed  RESP PANEL BY RT-PCR (FLU A&B, COVID) ARPGX2  CBC  BASIC METABOLIC PANEL  PROTIME-INR  TYPE AND SCREEN   ____________________________________________  EKG   ____________________________________________  RADIOLOGY I, Chany Woolworth, personally viewed and evaluated these images (plain radiographs) as part of my medical decision making, as well  as reviewing the written report by the radiologist.  ED MD interpretation: Left femoral neck fracture.  Official radiology report(s): DG Chest 1 View  Result Date: 09/21/2020 CLINICAL DATA:  Fall, left chest pain EXAM: CHEST  1 VIEW COMPARISON:  None. FINDINGS: Lungs are well expanded, symmetric, and clear. No pneumothorax or pleural effusion. Cardiac size within normal limits. Pulmonary vascularity is normal. Osseous structures are age-appropriate. Mild thoracic sigmoid scoliosis noted. No acute bone abnormality. IMPRESSION: No active disease. Electronically Signed   By: Fidela Salisbury MD   On: 09/21/2020 01:13   DG Hip Unilat W or Wo Pelvis 2-3 Views Left  Result Date: 09/21/2020 CLINICAL DATA:  Pt states she had fall around 7pm tonight and fell on her left side and is having pain to left upper leg. Pt has hx of parkinsons disease and has noted involuntary movement. EXAM: LEFT FEMUR PORTABLE 2 VIEWS; DG HIP (WITH OR WITHOUT PELVIS) 2-3V LEFT COMPARISON:  None. FINDINGS: Otherwise no acute displaced fracture of the bones of the pelvis. No pelvic diastasis.  Poorly visualized fractured and displaced left femoral neck. The distal left femur demonstrates no acute displaced fracture. Visualized portions of the left knee demonstrate tricompartmental degenerative changes in likely a small joint effusion. Frontal view of the visualized right hip grossly unremarkable. Visualized lumbar spine demonstrates degenerative changes. IMPRESSION: 1. Poorly visualized fractured and displaced left femoral neck. Recommend CT noncontrast for further evaluation. 2. No acute displaced fracture of the distal left femur. 3. No acute displaced fracture or diastasis of the bones of the pelvis. 4. Partially visualized frontal view of the right hip grossly unremarkable. 5. Tricompartmental degenerative changes of the left knee. Electronically Signed   By: Iven Finn M.D.   On: 09/21/2020 01:12   DG FEMUR PORT MIN 2 VIEWS LEFT  Result Date: 09/21/2020 CLINICAL DATA:  Pt states she had fall around 7pm tonight and fell on her left side and is having pain to left upper leg. Pt has hx of parkinsons disease and has noted involuntary movement. EXAM: LEFT FEMUR PORTABLE 2 VIEWS; DG HIP (WITH OR WITHOUT PELVIS) 2-3V LEFT COMPARISON:  None. FINDINGS: Otherwise no acute displaced fracture of the bones of the pelvis. No pelvic diastasis. Poorly visualized fractured and displaced left femoral neck. The distal left femur demonstrates no acute displaced fracture. Visualized portions of the left knee demonstrate tricompartmental degenerative changes in likely a small joint effusion. Frontal view of the visualized right hip grossly unremarkable. Visualized lumbar spine demonstrates degenerative changes. IMPRESSION: 1. Poorly visualized fractured and displaced left femoral neck. Recommend CT noncontrast for further evaluation. 2. No acute displaced fracture of the distal left femur. 3. No acute displaced fracture or diastasis of the bones of the pelvis. 4. Partially visualized frontal view of the right hip  grossly unremarkable. 5. Tricompartmental degenerative changes of the left knee. Electronically Signed   By: Iven Finn M.D.   On: 09/21/2020 01:12    ____________________________________________   PROCEDURES  Procedure(s) performed (including Critical Care):  Procedures   ____________________________________________   INITIAL IMPRESSION / ASSESSMENT AND PLAN / ED COURSE  As part of my medical decision making, I reviewed the following data within the [place] History obtained from family, Nursing notes reviewed and incorporated, Labs reviewed , EKG interpreted , Old chart reviewed, Radiograph reviewed , Discussed with admitting physician , A consult was requested and obtained from this/these consultant(s) Orthopedics and Notes from prior ED visits  Patient here after mechanical fall.  X-ray shows left femoral neck fracture.  Will discuss with orthopedics and obtain screening labs and admit to hospitalist.  Will give IV pain and nausea medicine.  She has been drinking soda in the room.  Will keep her NPO starting now.  ED PROGRESS  1:15 AM  Spoke with Dr. Sabra Heck orthopedics who will review patient's imaging.  1:47 AM  Pt scheduled to go to the operating room at 11 AM today with Dr. Sabra Heck.  She will stay n.p.o.  Labs pending.  Will discuss with hospitalist for admission.  3:43 AM Discussed patient's case with hospitalist, Dr. Sidney Ace.  I have recommended admission and patient (and family if present) agree with this plan. Admitting physician will place admission orders.   I reviewed all nursing notes, vitals, pertinent previous records and reviewed/interpreted all EKGs, lab and urine results, imaging (as available).   ____________________________________________   FINAL CLINICAL IMPRESSION(S) / ED DIAGNOSES  Final diagnoses:  Fall  Left displaced femoral neck fracture [facility])     ED Discharge Orders    None      *Please note:  Jasmine Colon was evaluated in Emergency Department on 09/21/2020 for the symptoms described in the history of present illness. She was evaluated in the context of the global COVID-19 pandemic, which necessitated consideration that the patient might be at risk for infection with the SARS-CoV-2 virus that causes COVID-19. Institutional protocols and algorithms that pertain to the evaluation of patients at risk for COVID-19 are in a state of rapid change based on information released by regulatory bodies including the CDC and federal and state organizations. These policies and algorithms were followed during the patient's care in the ED.  Some ED evaluations and interventions may be delayed as a result of limited staffing during and the pandemic.*   Note:  This document was prepared using Dragon voice recognition software and may include unintentional dictation errors.   Karmel Patricelli, Delice Bison, DO 09/21/20 [phone]

## 2020-09-21 NOTE — Op Note (Signed)
09/21/2020  1:23 PM  PATIENT:  Jasmine Colon   MRN: [id number]  PRE-OPERATIVE DIAGNOSIS:  Displaced Subcapital fracture left hip   POST-OPERATIVE DIAGNOSIS: Same  PROCEDURE: Left   hip hemiarthroplasty with Stryker Accolade prosthesis  PREOPERATIVE INDICATIONS:  Jasmine Colon is an 80 y.o. female who was admitted 09/21/2020 with a diagnosis of displaced subcapital fracture of the hip and elected for surgical management.  The risks benefits and alternatives were discussed with the patient including but not limited to the risks of nonoperative treatment, versus surgical intervention including infection, bleeding, nerve injury, periprosthetic fracture, the need for revision surgery, dislocation, leg length discrepancy, blood clots, cardiopulmonary complications, morbidity, mortality, among others, and they were willing to proceed.  Predicted outcome is good, although there will be at least a six to nine month expected recovery.     SURGEON:  Earnestine Leys, MD  ASST:    ANESTHESIA: Spinal    COMPLICATIONS:  None.   EBL: 100 cc    COMPONENTS:  Stryker Accolade Femoral Fracture stem size #3,   and a size   47 mm  fracture head unipolar hip ball with    +4 mm  neck length.    PROCEDURE IN DETAIL: The patient was met in the holding area and identified.  The appropriate hip  was marked at the operative site. The patient was then transported to the OR and  placed under general anesthesia.  At that point, the patient was  placed in the lateral decubitus position with the operative side up and  secured to the operating room table and all bony prominences padded.     The operative lower extremity was prepped from the iliac crest to the toes.  Sterile draping was performed.  Time out was performed prior to incision.      A routine posterolateral approach was utilized via sharp dissection  carried down to the subcutaneous tissue.  Gross bleeders were Bovie  coagulated.  The iliotibial band was  identified and incised  along the length of the skin incision.  Self-retaining retractors were  inserted.  With the hip internally rotated, the short external rotators  were identified. The piriformis was tagged and the hip capsule released in a T-type fashion.  The femoral neck was exposed, and I resected the femoral neck using the appropriate jig. This was performed at approximately a thumb's breadth above the lesser trochanter.    I then exposed the deep acetabulum, cleared out any tissue including the ligamentum teres.    I then prepared the proximal femur using the cookie-cutter, the lateralizing reamer, and then sequentially broached.  A trial stem   was  utilized along with a unipolar head and neck.  I reduced the hip and it was found to have excellent stability with functional range of motion. Leg lengths were equal.  The trial components were then removed.   The same size Accolade femoral stem was then inserted and was very stable.  The Unitrax head and neck as trialed were inserted as well.     The hip was then reduced and taken through functional range of motion and found to have excellent stability. Leg lengths were restored.     I closed the T in the capsule with #2 Ticron as well as the short external rotators. I then irrigated the hip copiously again with pulse lavage, and repaired the fascia with #2 Quill and the subcutaneous layer with #0 Quill. Sponge and needle counts were correct. Dry sterile  Aquacell was applied.   The patient was then awakened and returned to PACU in stable and satisfactory condition. There were no complications.  Park Breed, MD Orthopedic Surgeon [phone]   09/21/2020 1:23 PM

## 2020-09-21 NOTE — Progress Notes (Signed)
Pharmacy Lovenox Dosing  80 y.o. female admitted with Fall and Leg Pain . Patient ordered Lovenox 30 mg daily for VTE prophylaxis.   Filed Weights   09/21/20 0013 09/21/20 0624  Weight: 49.9 kg (110 lb) 51.6 kg (113 lb 12.1 oz)    Body mass index is 18.93 kg/m.  Estimated Creatinine Clearance: 45.7 mL/min (by C-G formula based on SCr of 0.84 mg/dL).  Will adjust Lovenox dosing to 40 mg Q24 hours.   Lance Coon A Jermarcus Mcfadyen 09/21/2020 3:35 PM

## 2020-09-21 NOTE — Transfer of Care (Signed)
Immediate Anesthesia Transfer of Care Note  Patient: Jasmine Colon  Procedure(s) Performed: ARTHROPLASTY BIPOLAR HIP (HEMIARTHROPLASTY) (Left Hip)  Patient Location: PACU  Anesthesia Type:Spinal  Level of Consciousness: sedated  Airway & Oxygen Therapy: Patient Spontanous Breathing and Patient connected to face mask oxygen  Post-op Assessment: Report given to RN and Post -op Vital signs reviewed and stable  Post vital signs: Reviewed and stable  Last Vitals:  Vitals Value Taken Time  BP 110/62 09/21/20 1330  Temp 36.8 C 09/21/20 1329  Pulse 82 09/21/20 1344  Resp 16 09/21/20 1329  SpO2 100 % 09/21/20 1344  Vitals shown include unvalidated device data.  Last Pain:  Vitals:   09/21/20 1329  TempSrc:   PainSc: Asleep         Complications: No complications documented.

## 2020-09-21 NOTE — Progress Notes (Signed)
Patient to PACU/OR

## 2020-09-21 NOTE — H&P (Signed)
THE PATIENT WAS SEEN PRIOR TO SURGERY TODAY.  HISTORY, ALLERGIES, HOME MEDICATIONS AND OPERATIVE PROCEDURE WERE REVIEWED. RISKS AND BENEFITS OF SURGERY DISCUSSED WITH PATIENT AGAIN.  NO CHANGES FROM INITIAL HISTORY AND PHYSICAL NOTED.    

## 2020-09-21 NOTE — Progress Notes (Signed)
Jasmine Colon at [place] NAME: Jasmine Colon    MR#:  [id number]  DATE OF BIRTH:  Jan 10, 1941  SUBJECTIVE:  patient came in after mechanical fall at home. She has Parkinson's disorder. Sustained hip fracture. Awaiting hip surgery. Daughter in the room.  REVIEW OF SYSTEMS:   Review of Systems  Constitutional: Negative for chills, fever and weight loss.  HENT: Negative for ear discharge, ear pain and nosebleeds.   Eyes: Negative for blurred vision, pain and discharge.  Respiratory: Negative for sputum production, shortness of breath, wheezing and stridor.   Cardiovascular: Negative for chest pain, palpitations, orthopnea and PND.  Gastrointestinal: Negative for abdominal pain, diarrhea, nausea and vomiting.  Genitourinary: Negative for frequency and urgency.  Musculoskeletal: Positive for joint pain. Negative for back pain.  Neurological: Positive for weakness. Negative for sensory change, speech change and focal weakness.  Psychiatric/Behavioral: Negative for depression and hallucinations. The patient is not nervous/anxious.    Tolerating Diet:NPO Tolerating PT: pending  DRUG ALLERGIES:   Allergies  Allergen Reactions  . Codeine     Feels weird  . Pseudoephedrine     Other reaction(s): Dizziness and giddiness (finding)    VITALS:  Blood pressure 129/80, pulse 81, temperature 99.2 F (37.3 C), resp. rate 18, height 5\' 5"  (1.651 m), weight 51.6 kg, SpO2 99 %.  PHYSICAL EXAMINATION:   Physical Exam  GENERAL:  80 y.o.-year-old patient lying in the bed with no acute distress.  LUNGS: Normal breath sounds bilaterally, no wheezing, rales, rhonchi. No use of accessory muscles of respiration.  CARDIOVASCULAR: S1, S2 normal. No murmurs, rubs, or gallops.  ABDOMEN: Soft, nontender, nondistended. Bowel sounds present. No organomegaly or mass.  EXTREMITIES: No cyanosis, clubbing or edema b/l.     PSYCHIATRIC:  patient is alert and oriented x 3.   SKIN: No obvious rash, lesion, or ulcer.   LABORATORY PANEL:  CBC Recent Labs  Lab 09/21/20 0821  WBC 8.6  HGB 10.7*  HCT 30.4*  PLT 258    Chemistries  Recent Labs  Lab 09/21/20 0153 09/21/20 0821  NA 135  --   K 4.0  --   CL 103  --   CO2 23  --   GLUCOSE 118*  --   BUN 24*  --   CREATININE 0.84  --   CALCIUM 8.6*  --   AST  --  43*  ALT  --  13  ALKPHOS  --  58  BILITOT  --  1.6*   Cardiac Enzymes No results for input(s): TROPONINI in the last 168 hours. RADIOLOGY:  DG Chest 1 View  Result Date: 09/21/2020 CLINICAL DATA:  Fall, left chest pain EXAM: CHEST  1 VIEW COMPARISON:  None. FINDINGS: Lungs are well expanded, symmetric, and clear. No pneumothorax or pleural effusion. Cardiac size within normal limits. Pulmonary vascularity is normal. Osseous structures are age-appropriate. Mild thoracic sigmoid scoliosis noted. No acute bone abnormality. IMPRESSION: No active disease. Electronically Signed   By: Fidela Salisbury MD   On: 09/21/2020 01:13   DG Hip Unilat W or Wo Pelvis 2-3 Views Left  Result Date: 09/21/2020 CLINICAL DATA:  Pt states she had fall around 7pm tonight and fell on her left side and is having pain to left upper leg. Pt has hx of parkinsons disease and has noted involuntary movement. EXAM: LEFT FEMUR PORTABLE 2 VIEWS; DG HIP (WITH OR WITHOUT PELVIS) 2-3V LEFT COMPARISON:  None. FINDINGS: Otherwise no acute displaced fracture  of the bones of the pelvis. No pelvic diastasis. Poorly visualized fractured and displaced left femoral neck. The distal left femur demonstrates no acute displaced fracture. Visualized portions of the left knee demonstrate tricompartmental degenerative changes in likely a small joint effusion. Frontal view of the visualized right hip grossly unremarkable. Visualized lumbar spine demonstrates degenerative changes. IMPRESSION: 1. Poorly visualized fractured and displaced left femoral neck. Recommend CT noncontrast for further evaluation. 2.  No acute displaced fracture of the distal left femur. 3. No acute displaced fracture or diastasis of the bones of the pelvis. 4. Partially visualized frontal view of the right hip grossly unremarkable. 5. Tricompartmental degenerative changes of the left knee. Electronically Signed   By: Iven Finn M.D.   On: 09/21/2020 01:12   DG FEMUR PORT MIN 2 VIEWS LEFT  Result Date: 09/21/2020 CLINICAL DATA:  Pt states she had fall around 7pm tonight and fell on her left side and is having pain to left upper leg. Pt has hx of parkinsons disease and has noted involuntary movement. EXAM: LEFT FEMUR PORTABLE 2 VIEWS; DG HIP (WITH OR WITHOUT PELVIS) 2-3V LEFT COMPARISON:  None. FINDINGS: Otherwise no acute displaced fracture of the bones of the pelvis. No pelvic diastasis. Poorly visualized fractured and displaced left femoral neck. The distal left femur demonstrates no acute displaced fracture. Visualized portions of the left knee demonstrate tricompartmental degenerative changes in likely a small joint effusion. Frontal view of the visualized right hip grossly unremarkable. Visualized lumbar spine demonstrates degenerative changes. IMPRESSION: 1. Poorly visualized fractured and displaced left femoral neck. Recommend CT noncontrast for further evaluation. 2. No acute displaced fracture of the distal left femur. 3. No acute displaced fracture or diastasis of the bones of the pelvis. 4. Partially visualized frontal view of the right hip grossly unremarkable. 5. Tricompartmental degenerative changes of the left knee. Electronically Signed   By: Iven Finn M.D.   On: 09/21/2020 01:12   ASSESSMENT AND PLAN:  Jasmine Colon is a 80 y.o. Caucasian female with medical history significant for asthma, dyslipidemia, osteoarthritis, osteoporosis, Parkinson's disease and right breast cancer status postlumpectomy and radiotherapy, who presented to the emergency room with acute onset of accidental mechanical fall at 7 PM and  subsequent Left hip pain.   Closed left hip fracture. Mechanical fall at home - Pain management will be provided. - Aspirin will be held off. - Orthopedic consultation Dr. Sabra Heck plan for hip surgery this afternoon.   Asthma with mild hypoxia without acute exacerbation. - She will be placed on scheduled and as needed DuoNebs. - O2 protocol will be followed given suboptimal pulse oximetry.   - She has no other current pulmonary issues.  Leukocytosis. -  likely secondary to stress demargination. - Her chest x-ray and urinalysis were unremarkable.  History of dyslipidemia. -  not on any statins at home   Parkinson's disease. - We will resume her Sinemet and Azilect. -- Patient follows with Dr. Manuella Ghazi neurology  Tobacco abuse. - We will utilize NicoDerm CQ patch only as needed. - She was counseled for smoking cessation and will receive further counseling here.  Chronic rhinitis. - continue Flonase nasal spray and Claritin.  DVT prophylaxis: SCDs.  Medical prophylaxis is postponed till postoperative period Code Status: The patient is DNR/DNI.   Family Communication:  dter in the room Disposition Plan: pending surgery and PT eval Consults called: Orthopedic consult. Status is: Inpatient  Inpatient level of care appropriate due to severity of illness   Dispo: The  patient is from: Home  Anticipated d/c is to: SNF  Patient currently is not medically stable to d/c.              Difficult to place patient No   TOTAL TIME TAKING CARE OF THIS PATIENT: *30* minutes.  >50% time spent on counselling and coordination of care  Note: This dictation was prepared with Dragon dictation along with smaller phrase technology. Any transcriptional errors that result from this process are unintentional.  Fritzi Mandes M.D    [facility]   CC: Primary care physician; Dion Body, MDPatient ID: Jasmine Colon, female   DOB: 1941-01-28, 80 y.o.    MRN: [id number]

## 2020-09-21 NOTE — Anesthesia Procedure Notes (Signed)
Spinal  Patient location during procedure: OR End time: 09/21/2020 11:20 AM Staffing Performed: anesthesiologist  Anesthesiologist: Harrie Foreman, MD Preanesthetic Checklist Completed: patient identified, IV checked, site marked, risks and benefits discussed, surgical consent, monitors and equipment checked, pre-op evaluation and timeout performed Spinal Block Patient position: left lateral decubitus Prep: ChloraPrep Patient monitoring: heart rate, cardiac monitor, continuous pulse ox and blood pressure Approach: midline Location: L3-4 Injection technique: single-shot Needle Needle type: Pencil-Tip  Needle gauge: 25 G Needle length: 9 cm Assessment Events: CSF return Additional Notes Lateral spinal placed with sedation secondary to pain with positioning.  1st pass, +CSF, no heme.  No complications

## 2020-09-21 NOTE — Consult Note (Signed)
ORTHOPAEDIC CONSULTATION  REQUESTING PHYSICIAN: Fritzi Mandes, MD  Chief Complaint: Left hip pain  HPI: Savana Spina is a 80 y.o. female who complains of left hip pain after mechanical fall at home last night.  She was throwing something into the trash and stumbled.  She was brought to the emergency room where exam and x-rays revealed a displaced subcapital fracture of the left hip.  She was admitted for medical evaluation, stabilization, and surgical preparation.  I discussed the condition with the patient and her family.  I have recommended a hemiarthroplasty due to the displacement of the fracture.  The risks and benefits and postop protocol were discussed with her in length at length.  Alternatives were discussed as well.  The request we will proceed with left hip hemiarthroplasty today.  Past Medical History:  Diagnosis Date  . Asthma 2012  . Hyperlipidemia   . Lesion of lateral popliteal nerve   . Lower back pain   . 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 Mitral valve disorder   . Osteoarthritis   . Osteoporosis   . Parkinson's disease ([place]) 2012  . Personal history of radiation therapy 2013   right breast ca with lumpectomy and rad tx   Past Surgical History:  Procedure Laterality Date  . BREAST BIOPSY Right 10/26/2011   invasive mammary carcinoma and DCIS  . BREAST BIOPSY Left 01/09/2014   retroaerolar bx with Dr. Jamal Collin. Benign  . BREAST LUMPECTOMY Right 11/13/2011   invasive mammary ca, DCIS, LCIS in specimen. Clear margins.  LN negative  . BREAST SURGERY Right 2013   wide excision  . CESAREAN SECTION  31 years ago  . COLONOSCOPY  2008   Social History   Socioeconomic History  . Marital status: Married    Spouse name: Jori Moll  . Number of children: 2  . Years of education: college  . Highest education level: Not on file  Occupational History  . 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 Smoking status: Current Every Day Smoker    Packs/day: 0.50    Years: 45.00    Pack years: 22.50    Types: Cigarettes  . Smokeless tobacco: Never Used  Substance and Sexual Activity  . Alcohol use: No  . Drug use: No  . Sexual activity: Not on file  Other Topics Concern  . Not on file  Social History Narrative   Patient is right handed, resides with husband   Social Determinants of Health   Financial Resource Strain: Not on file  Food Insecurity: Not on file  Transportation Needs: Not on file  Physical Activity: Not on file  Stress: Not on file  Social Connections: Not on file   Family History  Problem Relation Age of Onset  . Stroke Mother   . Heart failure Father   . Heart disease Brother        bypass  . Cancer Maternal Aunt        breast   . Breast cancer Maternal Aunt   . Cancer Maternal Aunt        breast  . Breast cancer Maternal Aunt    Allergies  Allergen Reactions  . Codeine     Feels weird  . Pseudoephedrine     Other reaction(s): Dizziness and giddiness (finding)   Prior to Admission medications  Medication Sig Start Date End Date Taking? Authorizing Provider  amoxicillin (AMOXIL) 500 MG capsule Take 500 mg by mouth every 8 (eight) hours. 09/09/20  Yes [provider]  Calcium Carbonate-Vitamin D (CALTRATE 600+D PO) Take 3 tablets by mouth 3 (three) times daily.    Yes [provider]  carbidopa-levodopa (SINEMET IR) 25-100 MG per tablet TAKE 0.5 TABLETS BY MOUTH 3 (THREE) TIMES DAILY. Patient taking differently: Take 0.5 tablets by mouth See admin instructions. Pt daughter states pt takes every 3-4 hours 12/21/13  Yes Star Age, MD  cyanocobalamin 1000 MCG tablet Take 100 mcg by mouth daily.   Yes [provider]  fluticasone (FLONASE) 50 MCG/ACT nasal spray Place 2 sprays into both nostrils daily as needed for allergies or rhinitis.    Yes [provider]  Ibuprofen 200 MG CAPS Take 400 mg by mouth every 8 (eight) hours as needed.   Yes [provider]  loratadine (CLARITIN) 10 MG tablet Take 10 mg by mouth daily as needed for allergies.   Yes [provider]  mirtazapine (REMERON) 30 MG tablet Take 30 mg by mouth at bedtime. 06/27/20  Yes [provider]  Multiple Vitamins-Minerals (PRESERVISION AREDS PO) Take by mouth daily.   Yes [provider]  rasagiline (AZILECT) 0.5 MG TABS tablet Take 0.5 mg by mouth daily.   Yes [provider]  sodium chloride (OCEAN) 0.65 % SOLN nasal spray Place 1 spray into both nostrils as needed for congestion.   Yes [provider]  aspirin EC 81 MG tablet Take 81 mg by mouth daily. Patient not taking: Reported on 09/21/2020    [provider]  ibandronate (BONIVA) 150 MG tablet Take 1 tablet by mouth every 30 (thirty) days. Patient not taking: Reported on 09/21/2020 11/24/12   [provider]  simvastatin (ZOCOR) 20 MG tablet Take 10 mg by mouth daily.  Patient not taking: Reported on 09/21/2020    [provider]   DG Chest 1 View  Result Date: 09/21/2020 CLINICAL DATA:  Fall, left chest pain EXAM: CHEST  1 VIEW COMPARISON:  None. FINDINGS: Lungs are well expanded, symmetric, and clear. No pneumothorax or pleural effusion. Cardiac size within normal limits. Pulmonary vascularity is normal. Osseous structures are age-appropriate. Mild thoracic sigmoid scoliosis noted. No acute bone abnormality. IMPRESSION: No active disease. Electronically Signed   By: Fidela Salisbury MD   On: 09/21/2020 01:13   DG Hip Unilat W or Wo Pelvis 2-3 Views Left  Result Date: 09/21/2020 CLINICAL DATA:  Pt states she had fall around 7pm tonight and fell on her left side and is having pain to left upper leg. Pt has hx of parkinsons disease and has noted involuntary movement. EXAM: LEFT FEMUR PORTABLE 2 VIEWS; DG HIP (WITH OR WITHOUT PELVIS)  2-3V LEFT COMPARISON:  None. FINDINGS: Otherwise no acute displaced fracture of the bones of the pelvis. No pelvic diastasis. Poorly visualized fractured and displaced left femoral neck. The distal left femur demonstrates no acute displaced fracture. Visualized portions of the left knee demonstrate tricompartmental degenerative changes in likely a small joint effusion. Frontal view of the visualized right hip grossly unremarkable. Visualized lumbar spine demonstrates degenerative changes. IMPRESSION: 1. Poorly visualized fractured and displaced left femoral neck. Recommend CT noncontrast for further evaluation. 2. No acute displaced fracture of the distal left femur. 3. No acute displaced fracture or diastasis of the bones of the pelvis. 4. Partially visualized frontal view of the right hip grossly unremarkable. 5.  Tricompartmental degenerative changes of the left knee. Electronically Signed   By: Iven Finn M.D.   On: 09/21/2020 01:12   DG FEMUR PORT MIN 2 VIEWS LEFT  Result Date: 09/21/2020 CLINICAL DATA:  Pt states she had fall around 7pm tonight and fell on her left side and is having pain to left upper leg. Pt has hx of parkinsons disease and has noted involuntary movement. EXAM: LEFT FEMUR PORTABLE 2 VIEWS; DG HIP (WITH OR WITHOUT PELVIS) 2-3V LEFT COMPARISON:  None. FINDINGS: Otherwise no acute displaced fracture of the bones of the pelvis. No pelvic diastasis. Poorly visualized fractured and displaced left femoral neck. The distal left femur demonstrates no acute displaced fracture. Visualized portions of the left knee demonstrate tricompartmental degenerative changes in likely a small joint effusion. Frontal view of the visualized right hip grossly unremarkable. Visualized lumbar spine demonstrates degenerative changes. IMPRESSION: 1. Poorly visualized fractured and displaced left femoral neck. Recommend CT noncontrast for further evaluation. 2. No acute displaced fracture of the distal left femur.  3. No acute displaced fracture or diastasis of the bones of the pelvis. 4. Partially visualized frontal view of the right hip grossly unremarkable. 5. Tricompartmental degenerative changes of the left knee. Electronically Signed   By: Iven Finn M.D.   On: 09/21/2020 01:12    Positive ROS: All other systems have been reviewed and were otherwise negative with the exception of those mentioned in the HPI and as above.  Physical Exam: General: Alert, no acute distress Cardiovascular: No pedal edema Respiratory: No cyanosis, no use of accessory musculature GI: No organomegaly, abdomen is soft and non-tender Skin: No lesions in the area of chief complaint Neurologic: Sensation intact distally Psychiatric: Patient is competent for consent with normal mood and affect Lymphatic: No axillary or cervical lymphadenopathy  MUSCULOSKELETAL: Left leg is shortened and rotated.  There is severe pain with movement.  There is minimal bruising proximally.  Neurovascular status is good.  The skin is good.  Right leg is unremarkable.  There is a small abrasion over the left patella.  Upper extremities are normal.  The back is unremarkable.  Assessment: Displaced subcapital fracture left hip  Plan: Left hip hemiarthroplasty    Park Breed, MD [phone]   09/21/2020 11:05 AM

## 2020-09-22 DIAGNOSIS — Z96649 Presence of unspecified artificial hip joint: Secondary | ICD-10-CM

## 2020-09-22 LAB — CBC
HCT: 28.1 % — ABNORMAL LOW (ref 36.0–46.0)
Hemoglobin: 9.7 g/dL — ABNORMAL LOW (ref 12.0–15.0)
MCH: 32.3 pg (ref 26.0–34.0)
MCHC: 34.5 g/dL (ref 30.0–36.0)
MCV: 93.7 fL (ref 80.0–100.0)
Platelets: 217 10*3/uL (ref 150–400)
RBC: 3 MIL/uL — ABNORMAL LOW (ref 3.87–5.11)
RDW: 13.7 % (ref 11.5–15.5)
WBC: 7.7 10*3/uL (ref 4.0–10.5)
nRBC: 0 % (ref 0.0–0.2)

## 2020-09-22 LAB — BASIC METABOLIC PANEL
Anion gap: 7 (ref 5–15)
BUN: 14 mg/dL (ref 8–23)
CO2: 24 mmol/L (ref 22–32)
Calcium: 7.9 mg/dL — ABNORMAL LOW (ref 8.9–10.3)
Chloride: 104 mmol/L (ref 98–111)
Creatinine, Ser: 0.74 mg/dL (ref 0.44–1.00)
GFR, Estimated: >60 mL/min (ref >60)
Glucose, Bld: 127 mg/dL — ABNORMAL HIGH (ref 70–99)
Potassium: 3.8 mmol/L (ref 3.5–5.1)
Sodium: 135 mmol/L (ref 135–145)

## 2020-09-22 MED ORDER — LORAZEPAM 2 MG/ML IJ SOLN
0.5000 mg | Freq: Once | INTRAMUSCULAR | Status: AC
  Administered 2020-09-22: 0.5 mg via INTRAVENOUS
  Filled 2020-09-22: qty 1

## 2020-09-22 MED ORDER — SODIUM CHLORIDE 0.9 % IV SOLN
INTRAVENOUS | Status: DC | PRN
  Administered 2020-09-22: 250 mL via INTRAVENOUS

## 2020-09-22 NOTE — Progress Notes (Signed)
Subjective: 1 Day Post-Op Procedure(s) (LRB): ARTHROPLASTY BIPOLAR HIP (HEMIARTHROPLASTY) (Left) Patient is alert and sitting up in bed.  She is having quite a bit of writhing from her Parkinson's today.  Her daughters are at the bedside.  She is having little pain.  Hemoglobin is 9.7.  Dressing is dry.  Hip is stable.  Patient reports pain as mild.  Objective:   VITALS:   Vitals:   09/22/20 1252 09/22/20 1612  BP: (!) 82/66 (!) 84/61  Pulse: 89 93  Resp: 16 18  Temp: 98.6 F (37 C) 98.7 F (37.1 C)  SpO2: 92% 93%    Dorsiflexion/Plantar flexion intact Incision: dressing C/D/I  LABS Recent Labs    09/21/20 0153 09/21/20 0821 09/22/20 0630  HGB 10.8* 10.7* 9.7*  HCT 31.3* 30.4* 28.1*  WBC 14.8* 8.6 7.7  PLT 274 258 217    Recent Labs    09/21/20 0153 09/22/20 0630  NA 135 135  K 4.0 3.8  BUN 24* 14  CREATININE 0.84 0.74  GLUCOSE 118* 127*    Recent Labs    09/21/20 0153  INR 1.1     Assessment/Plan: 1 Day Post-Op Procedure(s) (LRB): ARTHROPLASTY BIPOLAR HIP (HEMIARTHROPLASTY) (Left)   Advance diet Up with therapy Discharge to SNF   Return to clinic 2 weeks for exam and x-ray  Discharge enteric-coated aspirin twice daily  She is to be partial weightbearing on the left with a walker

## 2020-09-22 NOTE — NC FL2 (Signed)
[place] LEVEL OF CARE SCREENING TOOL     IDENTIFICATION  Patient Name: Jasmine Colon Birthdate: 10/23/1940 Sex: female Admission Date (Current Location): 09/21/2020  [place] and [place] Number:  Engineering geologist and Address:  [facility], [address] [place], [place] [address]      Provider Number: [id number]  Attending Physician Name and Address:  Fritzi Mandes, MD  Relative Name and Phone Number:  [name] Daughter [phone]    Current Level of Care: Hospital Recommended Level of Care: [place] Prior Approval Number:    Date Approved/Denied:   PASRR Number: [id number]  Discharge Plan: SNF    Current Diagnoses: Patient Active Problem List   Diagnosis Date Noted  . Status post hip hemiarthroplasty   . Left displaced femoral neck fracture ([place]) 09/21/2020  . Breast cancer ([place])   . Malignant neoplasm of upper-outer quadrant of female breast ([place]) 06/20/2013  . 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 Hip pain, left 07/19/2012  . Back pain, lumbar 07/19/2012  . Osteoporosis, unspecified 07/19/2012  . 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  . Parkinson disease ([place]) 07/19/2012    Orientation RESPIRATION BLADDER Height & Weight     Self,Place  Normal External catheter Weight: 51.6 kg Height:  5\' 5"  (165.1 cm)  BEHAVIORAL SYMPTOMS/MOOD NEUROLOGICAL BOWEL NUTRITION STATUS      Continent Diet (regular)  AMBULATORY STATUS COMMUNICATION OF NEEDS Skin   Extensive Assist Verbally Surgical wounds                       Personal Care Assistance Level of Assistance  Bathing,Dressing Bathing Assistance: Limited assistance   Dressing Assistance: Limited assistance     Functional Limitations Info             SPECIAL CARE FACTORS FREQUENCY  PT (By licensed PT)     PT Frequency: 5 times per week              Contractures Contractures Info: Not present    Additional Factors Info   Code Status,Allergies Code Status Info: DNR Allergies Info: Codeine, Pseudoephedrine           Current Medications (09/22/2020):  This is the current hospital active medication list Current Facility-Administered Medications  Medication Dose Route Frequency Provider Last Rate Last Admin  . 0.45 % sodium chloride infusion   Intravenous Continuous Earnestine Leys, MD   Stopped at 09/22/20 [phone]  . 0.9 %  sodium chloride infusion   Intravenous Continuous Earnestine Leys, MD   Stopped at 09/22/20 [phone]  . 0.9 %  sodium chloride infusion   Intravenous PRN Fritzi Mandes, MD 10 mL/hr at 09/22/20 0638 250 mL at 09/22/20 9767  . acetaminophen (TYLENOL) tablet 325-650 mg  325-650 mg Oral Q6H PRN Earnestine Leys, MD      . alum & mag hydroxide-simeth (MAALOX/MYLANTA) 200-200-20 MG/5ML suspension 30 mL  30 mL Oral Q4H PRN Earnestine Leys, MD      . bisacodyl (DULCOLAX) suppository 10 mg  10 mg Rectal Daily PRN Earnestine Leys, MD      . carbidopa-levodopa (SINEMET IR) 25-100 MG per tablet immediate release 1 tablet  1 tablet Oral 6 X Daily Earnestine Leys, MD   1 tablet at 09/22/20 1111  . ceFAZolin (ANCEF) IVPB 2g/100 mL premix  2 g Intravenous Lajuan Lines, MD 200 mL/hr at 09/22/20 1338 2 g at 09/22/20 1338  . Chlorhexidine Gluconate Cloth 2 % PADS 6 each  6 each Topical Daily Fritzi Mandes, MD   6 each at 09/22/20 0809  . clindamycin (CLEOCIN) IVPB 600 mg  600 mg Intravenous Lajuan Lines, MD 100 mL/hr at 09/22/20 0717 600 mg at 09/22/20 0717  . docusate sodium (COLACE) capsule 100 mg  100 mg Oral BID Earnestine Leys, MD   100 mg at 09/22/20 4481  . enoxaparin (LOVENOX) injection 40 mg  40 mg Subcutaneous Q24H Fritzi Mandes, MD   40 mg at 09/22/20 8563  . ferrous sulfate tablet 325 mg  325 mg Oral Q breakfast Earnestine Leys, MD   325 mg at 09/22/20 1497  . fluticasone (FLONASE) 50 MCG/ACT nasal spray 2 spray  2 spray Each Nare Daily PRN Earnestine Leys, MD      . HYDROcodone-acetaminophen (NORCO/VICODIN)  5-325 MG per tablet 1-2 tablet  1-2 tablet Oral Q4H PRN Earnestine Leys, MD   1 tablet at 09/21/20 2157  . loratadine (CLARITIN) tablet 10 mg  10 mg Oral Daily PRN Earnestine Leys, MD      . magnesium hydroxide (MILK OF MAGNESIA) suspension 30 mL  30 mL Oral Daily PRN Earnestine Leys, MD   30 mL at 09/22/20 0807  . menthol-cetylpyridinium (CEPACOL) lozenge 3 mg  1 lozenge Oral PRN Earnestine Leys, MD       Or  . phenol (CHLORASEPTIC) mouth spray 1 spray  1 spray Mouth/Throat PRN Earnestine Leys, MD      . methocarbamol (ROBAXIN) tablet 500 mg  500 mg Oral Q6H PRN Earnestine Leys, MD   500 mg at 09/21/20 2030   Or  . methocarbamol (ROBAXIN) 500 mg in dextrose 5 % 50 mL IVPB  500 mg Intravenous Q6H PRN Earnestine Leys, MD      . metoCLOPramide (REGLAN) tablet 5-10 mg  5-10 mg Oral Q8H PRN Earnestine Leys, MD       Or  . metoCLOPramide (REGLAN) injection 5-10 mg  5-10 mg Intravenous Q8H PRN Earnestine Leys, MD      . mirtazapine (REMERON) tablet 30 mg  30 mg Oral Tora Duck, MD   30 mg at 09/21/20 2029  . morphine 2 MG/ML injection 0.5-1 mg  0.5-1 mg Intravenous Q2H PRN Earnestine Leys, MD      . morphine 2 MG/ML injection 1 mg  1 mg Intravenous Q2H PRN Earnestine Leys, MD   1 mg at 09/21/20 [phone]  . multivitamin-lutein (OCUVITE-LUTEIN) capsule   Oral Daily Earnestine Leys, MD   1 capsule at 09/22/20 7858  . mupirocin ointment (BACTROBAN) 2 % 1 application  1 application Nasal BID Earnestine Leys, MD   1 application at 85/02/77 2048  . ondansetron (ZOFRAN) tablet 4 mg  4 mg Oral Q6H PRN Earnestine Leys, MD       Or  . ondansetron [facility]) injection 4 mg  4 mg Intravenous Q6H PRN Earnestine Leys, MD   4 mg at 09/21/20 2154  . rasagiline (AZILECT) tablet 1 mg  1 mg Oral Daily Earnestine Leys, MD   1 mg at 09/22/20 4128  . simvastatin (ZOCOR) tablet 10 mg  10 mg Oral Daily Earnestine Leys, MD   10 mg at 09/22/20 7867  . sodium chloride (OCEAN) 0.65 % nasal spray 1 spray  1 spray Each Nare PRN Earnestine Leys,  MD      . tranexamic acid (CYKLOKAPRON) IVPB 1,000 mg  1,000 mg Intravenous Once Earnestine Leys, MD      . traZODone (DESYREL) tablet 25 mg  25 mg Oral QHS PRN Sabra Heck,  Nadara Mustard, MD      . vitamin B-12 (CYANOCOBALAMIN) tablet 100 mcg  100 mcg Oral Daily Earnestine Leys, MD   100 mcg at 09/22/20 2119  . zolpidem (AMBIEN) tablet 5 mg  5 mg Oral QHS PRN Earnestine Leys, MD         Discharge Medications: Please see discharge summary for a list of discharge medications.  Relevant Imaging Results:  Relevant Lab Results:   Additional Information SS# [id number], fully Covid vaccinated and boosted  Su Hilt, RN

## 2020-09-22 NOTE — TOC Progression Note (Signed)
Transition of Care [facility]) - Progression Note    Patient Details  Name: Jasmine Colon MRN: [id number] Date of Birth: 10-Sep-1940  Transition of [facility]) CM/SW [place], RN Phone Number: 09/22/2020, 2:08 PM  Clinical Narrative:    Met with the family at the bedside, the patient is somewhat confused, They are in agreeance to a bed search and they prefer [facility], She has been Covid vaccinated and has had booster, PASSR obtained, Fl2 completed, Bedsearch sent, notified Seth Bake at [facility] that she prefers it there       Expected Discharge Plan and Services                                                 Social Determinants of Health (SDOH) Interventions    Readmission Risk Interventions No flowsheet data found.

## 2020-09-22 NOTE — Evaluation (Addendum)
Physical Therapy Evaluation Patient Details Name: Jasmine Colon MRN: [id number] DOB: 10-20-1940 Today's Date: 09/22/2020   History of Present Illness  Pt admitted for L femoral neck fracture s/p fall. Pt now with L hip hemiarthroplasty. HIstory of Parkinsons disease (takes meds 6xs/day), asthma, and HLD.  Clinical Impression  Pt is a pleasant 80 year old female who was admitted for traumatic L femoral neck fracture now s/p L hip hemiarthroplasty. Only received 2 doses of Sinemet prior to therapy with severe flare up of Parkingson's symptoms including severe dyskinesia appears to worsen with intentional movement and pain. Pt educated on hip precautions. Pt performs bed mobility with max assist, however had to abort further movement due to uncontrollable movements and follow commands despite being alert and oriented. Further mobility deferred at this time. Pt with decreased O2 sats with mobility to 87% of RA. Reapplied 2L of O2 with sats improved to 92%. Pt demonstrates deficits with strength/mobility/endurance/pain. Would benefit from skilled PT to address above deficits and promote optimal return to PLOF; recommend transition to STR upon discharge from acute hospitalization.     Follow Up Recommendations SNF    Equipment Recommendations   (TBD)    Recommendations for Other Services       Precautions / Restrictions Precautions Precautions: Fall;Posterior Hip Precaution Booklet Issued: Yes (comment) Restrictions Weight Bearing Restrictions: Yes LLE Weight Bearing: Partial weight bearing      Mobility  Bed Mobility Overal bed mobility: Needs Assistance Bed Mobility: Supine to Sit     Supine to sit: Max assist     General bed mobility comments: attempted with cues and assist for placement of B UE on railings. With movement of surgical LE, increased dyskinesia with failing of all extremites, uncontrollable. Placed L LE back into bed. Not safe to perform further bed mobility due to  increased movement with pain and pt unable to protect head from striking bed rail. Reposition at end of session including placing foam wedge between legs    Transfers                 General transfer comment: not safe  Ambulation/Gait             General Gait Details: not safe to perform  Stairs            Wheelchair Mobility    Modified Rankin (Stroke Patients Only)       Balance                                             Pertinent Vitals/Pain Pain Assessment: 0-10 Pain Score: 4  Pain Location: L Hip Pain Descriptors / Indicators: Aching;Discomfort;Dull;Operative site guarding Pain Intervention(s): Limited activity within patient's tolerance;Ice applied;Repositioned    Home Living Family/patient expects to be discharged to:: Private residence Living Arrangements: Spouse/significant other Available Help at Discharge: Family Type of Home: House Home Access: Stairs to enter Entrance Stairs-Rails: None Entrance Stairs-Number of Steps: 1 Home Layout: Two level;Able to live on main level with bedroom/bathroom Home Equipment: Walker - 2 wheels      Prior Function Level of Independence: Independent         Comments: reports no recent falls. Indep prior to admission     Hand Dominance        Extremity/Trunk Assessment   Upper Extremity Assessment Upper Extremity Assessment:  (unable to fully assess;  appears grossly WNL)    Lower Extremity Assessment Lower Extremity Assessment: Generalized weakness (difficult to assess, appears atleast 3+/5)       Communication   Communication: No difficulties  Cognition Arousal/Alertness: Awake/alert Behavior During Therapy: WFL for tasks assessed/performed;Restless Overall Cognitive Status: Within Functional Limits for tasks assessed                                 General Comments: severe dyskinesia throughout session involving mouth, B UE/LE.      General  Comments      Exercises Other Exercises Other Exercises: Supine ther-ex performed on L LE including hip abd/add and SAQ. Needs mod assist for correct technique. Pt unable to perform smaller muscle movements including quad sets and AP. Written HEP given and reviewed.   Assessment/Plan    PT Assessment Patient needs continued PT services  PT Problem List Decreased strength;Decreased activity tolerance;Decreased mobility;Decreased coordination;Pain       PT Treatment Interventions DME instruction;Gait training;Therapeutic exercise;Balance training    PT Goals (Current goals can be found in the Care Plan section)  Acute Rehab PT Goals Patient Stated Goal: to go home and not SNF PT Goal Formulation: With patient Time For Goal Achievement: 10/06/20 Potential to Achieve Goals: Good    Frequency BID   Barriers to discharge        Co-evaluation               AM-PAC PT "6 Clicks" Mobility  Outcome Measure Help needed turning from your back to your side while in a flat bed without using bedrails?: Total Help needed moving from lying on your back to sitting on the side of a flat bed without using bedrails?: Total Help needed moving to and from a bed to a chair (including a wheelchair)?: Total Help needed standing up from a chair using your arms (e.g., wheelchair or bedside chair)?: Total Help needed to walk in hospital room?: Total Help needed climbing 3-5 steps with a railing? : Total 6 Click Score: 6    End of Session Equipment Utilized During Treatment: Oxygen Activity Tolerance: Patient limited by pain;Treatment limited secondary to medical complications (Comment) Patient left: in bed;with bed alarm set;with SCD's reapplied Nurse Communication: Mobility status PT Visit Diagnosis: Muscle weakness (generalized) (M62.81);History of falling (Z91.81);Difficulty in walking, not elsewhere classified (R26.2);Pain Pain - Right/Left: Left Pain - part of body: Hip    Time:  1735-6701 PT Time Calculation (min) (ACUTE ONLY): 29 min   Charges:   PT Evaluation $PT Eval Moderate Complexity: 1 Mod PT Treatments $Therapeutic Exercise: 8-22 mins        Jasmine Colon, PT, DPT [phone]   Jasmine Colon 09/22/2020, 11:53 AM

## 2020-09-22 NOTE — Progress Notes (Signed)
Pt flailing arms and legs uncontrollably. States it is r/t Parkinson's disorder. She appears to be nauseated. When asked, she nodded her head in the affirmative. Administered Zofran as ordered. Patient's actions ceased after a short period of time once ain medications and zofran were given. She denies any other discomfort at this time. Will continue to monitor.

## 2020-09-22 NOTE — Plan of Care (Signed)
  Problem: Clinical Measurements: Goal: Ability to maintain clinical measurements within normal limits will improve Outcome: Progressing Goal: Will remain free from infection Outcome: Progressing Goal: Diagnostic test results will improve Outcome: Progressing Goal: Respiratory complications will improve Outcome: Progressing Goal: Cardiovascular complication will be avoided Outcome: Progressing   Problem: Nutrition: Goal: Adequate nutrition will be maintained Outcome: Progressing   Problem: Coping: Goal: Level of anxiety will decrease Outcome: Progressing   Problem: Pain Managment: Goal: General experience of comfort will improve Outcome: Progressing   Problem: Safety: Goal: Ability to remain free from injury will improve Outcome: Progressing   

## 2020-09-22 NOTE — Progress Notes (Signed)
[place] [facility] [place] NAME: Jasmine Colon    MR#:  [id number]  DATE OF BIRTH:  1940-06-09  SUBJECTIVE:  patient has significant dyskinetic movement this morning. She only got two doses of Sinemet yesterday due to her surgery. Husband at bedside. Wants to try small dose of Ativan to see if that calms her down  REVIEW OF SYSTEMS:   Review of Systems  Constitutional: Negative for chills, fever and weight loss.  HENT: Negative for ear discharge, ear pain and nosebleeds.   Eyes: Negative for blurred vision, pain and discharge.  Respiratory: Negative for sputum production, shortness of breath, wheezing and stridor.   Cardiovascular: Negative for chest pain, palpitations, orthopnea and PND.  Gastrointestinal: Negative for abdominal pain, diarrhea, nausea and vomiting.  Genitourinary: Negative for frequency and urgency.  Musculoskeletal: Positive for joint pain. Negative for back pain.  Neurological: Positive for weakness. Negative for sensory change, speech change and focal weakness.  Psychiatric/Behavioral: Negative for depression and hallucinations. The patient is not nervous/anxious.    Tolerating Diet:yes Tolerating PT: rehab DRUG ALLERGIES:   Allergies  Allergen Reactions  . Codeine     Feels weird  . Pseudoephedrine     Other reaction(s): Dizziness and giddiness (finding)    VITALS:  Blood pressure (!) 82/66, pulse 89, temperature 98.6 F (37 C), resp. rate 16, height 5\' 5"  (1.651 m), weight 51.6 kg, SpO2 92 %.  PHYSICAL EXAMINATION:   Physical Exam  GENERAL:  80 y.o.-year-old patient lying in the bed with no acute distress.  LUNGS: Normal breath sounds bilaterally, no wheezing, rales, rhonchi. No use of accessory muscles of respiration.  CARDIOVASCULAR: S1, S2 normal. No murmurs, rubs, or gallops.  ABDOMEN: Soft, nontender, nondistended. Bowel sounds present. No organomegaly or mass.  EXTREMITIES: No cyanosis, clubbing or edema  b/l.    Dyskinesia+, DJD PSYCHIATRIC:  patient is alert and oriented x 3.  SKIN: No obvious rash, lesion, or ulcer.   LABORATORY PANEL:  CBC Recent Labs  Lab 09/22/20 0630  WBC 7.7  HGB 9.7*  HCT 28.1*  PLT 217    Chemistries  Recent Labs  Lab 09/21/20 0821 09/22/20 0630  NA  --  135  K  --  3.8  CL  --  104  CO2  --  24  GLUCOSE  --  127*  BUN  --  14  CREATININE  --  0.74  CALCIUM  --  7.9*  AST 43*  --   ALT 13  --   ALKPHOS 58  --   BILITOT 1.6*  --    Cardiac Enzymes No results for input(s): TROPONINI in the last 168 hours. RADIOLOGY:  DG Chest 1 View  Result Date: 09/21/2020 CLINICAL DATA:  Fall, left chest pain EXAM: CHEST  1 VIEW COMPARISON:  None. FINDINGS: Lungs are well expanded, symmetric, and clear. No pneumothorax or pleural effusion. Cardiac size within normal limits. Pulmonary vascularity is normal. Osseous structures are age-appropriate. Mild thoracic sigmoid scoliosis noted. No acute bone abnormality. IMPRESSION: No active disease. Electronically Signed   By: Fidela Salisbury MD   On: 09/21/2020 01:13   DG Hip Port Unilat With Pelvis 1V Left  Result Date: 09/21/2020 CLINICAL DATA:  Postop LEFT hip surgery EXAM: DG HIP (WITH OR WITHOUT PELVIS) 1V PORT LEFT COMPARISON:  None. FINDINGS: Unipolar LEFT hip arthroplasty. Expected soft tissue changes. No fracture or dislocation. IMPRESSION: No complication following LEFT hip arthroplasty. Electronically Signed   By: Nicole Kindred  Leonia Reeves M.D.   On: 09/21/2020 14:00   DG Hip Unilat W or Wo Pelvis 2-3 Views Left  Result Date: 09/21/2020 CLINICAL DATA:  Pt states she had fall around 7pm tonight and fell on her left side and is having pain to left upper leg. Pt has hx of parkinsons disease and has noted involuntary movement. EXAM: LEFT FEMUR PORTABLE 2 VIEWS; DG HIP (WITH OR WITHOUT PELVIS) 2-3V LEFT COMPARISON:  None. FINDINGS: Otherwise no acute displaced fracture of the bones of the pelvis. No pelvic diastasis. Poorly  visualized fractured and displaced left femoral neck. The distal left femur demonstrates no acute displaced fracture. Visualized portions of the left knee demonstrate tricompartmental degenerative changes in likely a small joint effusion. Frontal view of the visualized right hip grossly unremarkable. Visualized lumbar spine demonstrates degenerative changes. IMPRESSION: 1. Poorly visualized fractured and displaced left femoral neck. Recommend CT noncontrast for further evaluation. 2. No acute displaced fracture of the distal left femur. 3. No acute displaced fracture or diastasis of the bones of the pelvis. 4. Partially visualized frontal view of the right hip grossly unremarkable. 5. Tricompartmental degenerative changes of the left knee. Electronically Signed   By: Iven Finn M.D.   On: 09/21/2020 01:12   DG FEMUR PORT MIN 2 VIEWS LEFT  Result Date: 09/21/2020 CLINICAL DATA:  Pt states she had fall around 7pm tonight and fell on her left side and is having pain to left upper leg. Pt has hx of parkinsons disease and has noted involuntary movement. EXAM: LEFT FEMUR PORTABLE 2 VIEWS; DG HIP (WITH OR WITHOUT PELVIS) 2-3V LEFT COMPARISON:  None. FINDINGS: Otherwise no acute displaced fracture of the bones of the pelvis. No pelvic diastasis. Poorly visualized fractured and displaced left femoral neck. The distal left femur demonstrates no acute displaced fracture. Visualized portions of the left knee demonstrate tricompartmental degenerative changes in likely a small joint effusion. Frontal view of the visualized right hip grossly unremarkable. Visualized lumbar spine demonstrates degenerative changes. IMPRESSION: 1. Poorly visualized fractured and displaced left femoral neck. Recommend CT noncontrast for further evaluation. 2. No acute displaced fracture of the distal left femur. 3. No acute displaced fracture or diastasis of the bones of the pelvis. 4. Partially visualized frontal view of the right hip grossly  unremarkable. 5. Tricompartmental degenerative changes of the left knee. Electronically Signed   By: Iven Finn M.D.   On: 09/21/2020 01:12   ASSESSMENT AND PLAN:  Jasmine Colon is a 80 y.o. Caucasian female with medical history significant for asthma, dyslipidemia, osteoarthritis, osteoporosis, Parkinson's disease and right breast cancer status postlumpectomy and radiotherapy, who presented to the emergency room with acute onset of accidental mechanical fall at 7 PM and subsequent Left hip pain.   Closed left hip fracture. Mechanical fall at home -POD #1 - Pain meds prn - Aspirin to be resumed - Orthopedic consultation Dr. Sabra Heck s/p hip surgery. -on Lovenox for DVT proph   Asthma with mild hypoxia without acute exacerbation. - She will be placed on scheduled and as needed DuoNebs. - O2 protocol will be followed given suboptimal pulse oximetry.   - She has no other current pulmonary issues.  Leukocytosis. -  likely secondary to stress demargination. - Her chest x-ray and urinalysis were unremarkable.  History of dyslipidemia. -  not on any statins at home   Parkinson's disease. Dyskinetic movement - We will resume her Sinemet and Azilect. -- Patient follows with Dr. Manuella Ghazi neurology -- patient currently has dyskinesia likely due  to inadequate dosing of Sinemet due to timing of her surgery. Appears she got only two doses yesterday instead of six -- patient okay to try IV Ativan 0.5 mg  Tobacco abuse. -  NicoDerm CQ patch only as needed. - She was counseled for smoking cessation  Chronic rhinitis. - continue Flonase nasal spray and Claritin.  DVT prophylaxis:  Lovenox  code Status: The patient is DNR/DNI.   Family Communication: husband in the room Disposition Plan: rehab Consults called: Orthopedic consult. Status is: Inpatient  Inpatient level of care appropriate due to severity of illness   Dispo: The patient is from: Home  Anticipated  d/c is to: SNF  Patient currently is not medically stable to d/c.              Difficult to place patient No   TOTAL TIME TAKING CARE OF THIS PATIENT: *30* minutes.  >50% time spent on counselling and coordination of care  Note: This dictation was prepared with Dragon dictation along with smaller phrase technology. Any transcriptional errors that result from this process are unintentional.  Fritzi Mandes M.D    [facility]   CC: Primary care physician; Dion Body, MDPatient ID: Jasmine Colon, female   DOB: 1940-07-07, 80 y.o.   MRN: [id number]

## 2020-09-23 ENCOUNTER — Encounter: Payer: Self-pay | Admitting: Specialist

## 2020-09-23 DIAGNOSIS — E785 Hyperlipidemia, unspecified: Secondary | ICD-10-CM

## 2020-09-23 DIAGNOSIS — W19XXXS Unspecified fall, sequela: Secondary | ICD-10-CM

## 2020-09-23 DIAGNOSIS — W19XXXA Unspecified fall, initial encounter: Secondary | ICD-10-CM

## 2020-09-23 LAB — RESP PANEL BY RT-PCR (FLU A&B, COVID) ARPGX2
Influenza A by PCR: NEGATIVE
Influenza B by PCR: NEGATIVE
SARS Coronavirus 2 by RT PCR: NEGATIVE

## 2020-09-23 NOTE — Progress Notes (Signed)
Subjective: 2 Days Post-Op Procedure(s) (LRB): ARTHROPLASTY BIPOLAR HIP (HEMIARTHROPLASTY) (Left) Patient is alert and lying comfortably in bed.  Her dyskinesias but not much better today.  Her husband is at the bedside.  Exam shows the dressing is dry.  Her hemoglobin is stable at 9.7.  She has been accepted for [facility].  Patient reports pain as mild.  Objective:   VITALS:   Vitals:   09/23/20 0412 09/23/20 0842  BP: 114/66 91/60  Pulse: 86 93  Resp: 17 20  Temp: 98.8 F (37.1 C) 98.1 F (36.7 C)  SpO2: 94% 99%    Sensation intact distally Incision: dressing C/D/I  LABS Recent Labs    09/21/20 0153 09/21/20 0821 09/22/20 0630  HGB 10.8* 10.7* 9.7*  HCT 31.3* 30.4* 28.1*  WBC 14.8* 8.6 7.7  PLT 274 258 217    Recent Labs    09/21/20 0153 09/22/20 0630  NA 135 135  K 4.0 3.8  BUN 24* 14  CREATININE 0.84 0.74  GLUCOSE 118* 127*    Recent Labs    09/21/20 0153  INR 1.1     Assessment/Plan: 2 Days Post-Op Procedure(s) (LRB): ARTHROPLASTY BIPOLAR HIP (HEMIARTHROPLASTY) (Left)   Advance diet Up with therapy Discharge to SNF tomorrow hopefully.  Enteric-coated aspirin twice daily for DVT prophylaxis  Partial weightbearing on left leg  Return to clinic 2 weeks for exam and x-rays

## 2020-09-23 NOTE — TOC Progression Note (Addendum)
Transition of Care [facility]) - Progression Note    Patient Details  Name: Jasmine Colon MRN: [id number] Date of Birth: 10/20/1940  Transition of Care [facility]) CM/SW [place], RN Phone Number: 09/23/2020, 9:38 AM  Clinical Narrative:   Damaris Schooner with Seth Bake at [facility] They are accepting the patient to come on Tuesday, Called [facility] / [facility]TA and requested ins auth for STR and EMS using [facility], notified [name] the patients daughter and her husband as well as the son and the patient         Expected Discharge Plan and Services                                                 Social Determinants of Health (SDOH) Interventions    Readmission Risk Interventions No flowsheet data found.

## 2020-09-23 NOTE — Progress Notes (Signed)
[place] [facility] [place] NAME: Jasmine Colon    MR#:  [id number]  DATE OF BIRTH:  Aug 29, 1940  SUBJECTIVE:  patient out in the chair. This kinetic movement resolved. Doing overall well. Complains of hip pain.  REVIEW OF SYSTEMS:   Review of Systems  Constitutional: Negative for chills, fever and weight loss.  HENT: Negative for ear discharge, ear pain and nosebleeds.   Eyes: Negative for blurred vision, pain and discharge.  Respiratory: Negative for sputum production, shortness of breath, wheezing and stridor.   Cardiovascular: Negative for chest pain, palpitations, orthopnea and PND.  Gastrointestinal: Negative for abdominal pain, diarrhea, nausea and vomiting.  Genitourinary: Negative for frequency and urgency.  Musculoskeletal: Positive for joint pain. Negative for back pain.  Neurological: Positive for weakness. Negative for sensory change, speech change and focal weakness.  Psychiatric/Behavioral: Negative for depression and hallucinations. The patient is not nervous/anxious.    Tolerating Diet:yes Tolerating PT: rehab DRUG ALLERGIES:   Allergies  Allergen Reactions  . Codeine     Feels weird  . Pseudoephedrine     Other reaction(s): Dizziness and giddiness (finding)    VITALS:  Blood pressure 91/60, pulse 93, temperature 98.1 F (36.7 C), resp. rate 20, height 5\' 5"  (1.651 m), weight 51.6 kg, SpO2 99 %.  PHYSICAL EXAMINATION:   Physical Exam  GENERAL:  80 y.o.-year-old patient lying in the bed with no acute distress.  LUNGS: Normal breath sounds bilaterally, no wheezing, rales, rhonchi. No use of accessory muscles of respiration.  CARDIOVASCULAR: S1, S2 normal. No murmurs, rubs, or gallops.  ABDOMEN: Soft, nontender, nondistended. Bowel sounds present. No organomegaly or mass.  EXTREMITIES: No cyanosis, clubbing or edema b/l.    Dyskinesia resolved, DJD PSYCHIATRIC:  patient is alert and oriented x 3.  SKIN: No obvious rash,  lesion, or ulcer.   LABORATORY PANEL:  CBC Recent Labs  Lab 09/22/20 0630  WBC 7.7  HGB 9.7*  HCT 28.1*  PLT 217    Chemistries  Recent Labs  Lab 09/21/20 0821 09/22/20 0630  NA  --  135  K  --  3.8  CL  --  104  CO2  --  24  GLUCOSE  --  127*  BUN  --  14  CREATININE  --  0.74  CALCIUM  --  7.9*  AST 43*  --   ALT 13  --   ALKPHOS 58  --   BILITOT 1.6*  --    Cardiac Enzymes No results for input(s): TROPONINI in the last 168 hours. RADIOLOGY:  DG Hip Port Unilat With Pelvis 1V Left  Result Date: 09/21/2020 CLINICAL DATA:  Postop LEFT hip surgery EXAM: DG HIP (WITH OR WITHOUT PELVIS) 1V PORT LEFT COMPARISON:  None. FINDINGS: Unipolar LEFT hip arthroplasty. Expected soft tissue changes. No fracture or dislocation. IMPRESSION: No complication following LEFT hip arthroplasty. Electronically Signed   By: Suzy Bouchard M.D.   On: 09/21/2020 14:00   ASSESSMENT AND PLAN:  Kaislyn Gulas is a 80 y.o. Caucasian female with medical history significant for asthma, dyslipidemia, osteoarthritis, osteoporosis, Parkinson's disease and right breast cancer status postlumpectomy and radiotherapy, who presented to the emergency room with acute onset of accidental mechanical fall at 7 PM and subsequent Left hip pain.   Closed left hip fracture. Mechanical fall at home -POD # 2 - Pain meds prn - Aspirin to be resumed - Orthopedic consultation Dr. Sabra Heck s/p hip surgery. -on Lovenox for DVT proph  Asthma with mild hypoxia without acute exacerbation. - She will be placed on scheduled and as needed DuoNebs. - wean to RA  - She has no other current pulmonary issues.  Leukocytosis. -  likely secondary to stress demargination. - Her chest x-ray and urinalysis were unremarkable.  History of dyslipidemia. -  not on any statins at home   Parkinson's disease. Dyskinetic movement - cont Sinemet and Azilect. -- Patient follows with Dr. Manuella Ghazi neurology -- patient currently has  dyskinesia likely due to inadequate dosing of Sinemet due to timing of her surgery. Appears she got only two doses yesterday instead of six -- patient okay to try IV Ativan 0.5 mg --6/6-- patient doing well. Dyskinesia resolved. Working with physical therapy.  Tobacco abuse. -  NicoDerm CQ patch only as needed. - She was counseled for smoking cessation  Chronic rhinitis. - continue Flonase nasal spray and Claritin.  DVT prophylaxis:  Lovenox  code Status: The patient is DNR/DNI.   Family Communication: left message for daughter [name] Disposition Plan: rehab Consults called: Orthopedic consult. Status is: Inpatient  Inpatient level of care appropriate due to severity of illness   Dispo: The patient is from: Home  Anticipated d/c is to: SNF  Patient currently is medically improving and will discharge to [facility] tomorrow if she continues to remain stable               Difficult to place patient No   TOTAL TIME TAKING CARE OF THIS PATIENT: *30* minutes.  >50% time spent on counselling and coordination of care  Note: This dictation was prepared with Dragon dictation along with smaller phrase technology. Any transcriptional errors that result from this process are unintentional.  Fritzi Mandes M.D    [facility]   CC: Primary care physician; Dion Body, MDPatient ID: Jasmine Colon, female   DOB: [birth year], 80 y.o.   MRN: [id number]

## 2020-09-23 NOTE — Evaluation (Signed)
Occupational Therapy Evaluation Patient Details Name: Jasmine Colon MRN: [id number] DOB: 04/05/1941 Today's Date: 09/23/2020    History of Present Illness Pt admitted for L femoral neck fracture s/p fall. Pt now with L hip hemiarthroplasty. HIstory of Parkinsons disease (takes meds 6xs/day), asthma, and HLD.   Clinical Impression   Jasmine Colon was seen for OT evaluation this date. Prior to hospital admission, pt was MOD I for mobility and ADLs, pt was caregiver for her grandchildren. Pt lives with husband in 2 level home c 1 STE, able to live on main. Pt presents to acute OT demonstrating impaired ADL performance and functional mobility 2/2 decreased activity tolerance, functional strength/ROM/balance deficits, and coordination deficits. Upon arrival pt completed SPT bed>chair with PT and NT. Pt currently requires MAX A don R sock, TOTAL A don L sock seated in chair. SETUP + SUPERVISION seated grooming and self-drinking. Pt educated on call bell use, noted to be safe sitting in chair with moderate dyskinesia movements, NT notified. Pt would benefit from skilled OT to address noted impairments and functional limitations (see below for any additional details) in order to maximize safety and independence while minimizing falls risk and caregiver burden. Upon hospital discharge, recommend STR to maximize pt safety and return to PLOF.     Follow Up Recommendations  SNF;Supervision/Assistance - 24 hour    Equipment Recommendations  3 in 1 bedside commode    Recommendations for Other Services       Precautions / Restrictions Precautions Precautions: Fall;Posterior Hip Restrictions Weight Bearing Restrictions: Yes LLE Weight Bearing: Partial weight bearing      Mobility Bed Mobility               General bed mobility comments: pt received and left in chair    Transfers Overall transfer level: Needs assistance   Transfers: Stand Pivot Transfers   Stand pivot transfers: Max  assist;+2 physical assistance       General transfer comment: upon arrival pt completing SPT, requires +2 hnads on assist    Balance Overall balance assessment: Needs assistance Sitting-balance support: Feet supported;Bilateral upper extremity supported Sitting balance-Leahy Scale: Fair     Standing balance support: Bilateral upper extremity supported Standing balance-Leahy Scale: Zero                             ADL either performed or assessed with clinical judgement   ADL Overall ADL's : Needs assistance/impaired                                       General ADL Comments: MAX A don R sock, TOTAL A don L sock seated in chair. SETUP + SUPERVISION seated grooming and self-drinking. MAX A x2 for ADL t/f                  Pertinent Vitals/Pain Pain Assessment: 0-10 Pain Score: 2  Pain Location: L Hip Pain Descriptors / Indicators: Aching;Discomfort;Dull;Operative site guarding Pain Intervention(s): Limited activity within patient's tolerance;Repositioned     Hand Dominance     Extremity/Trunk Assessment Upper Extremity Assessment Upper Extremity Assessment: Overall WFL for tasks assessed (dykinesia)   Lower Extremity Assessment Lower Extremity Assessment: Generalized weakness       Communication Communication Communication: No difficulties   Cognition Arousal/Alertness: Awake/alert Behavior During Therapy: WFL for tasks assessed/performed;Restless Overall Cognitive Status: Within Functional Limits  for tasks assessed                                 General Comments: severe dyskinesia throughout session involving mouth, B UE/LE.   General Comments  Sitting BP 91/60, MAP 70    Exercises Exercises: Other exercises Other Exercises Other Exercises: Pt edcuated re: OT role, DME recs, d/c recs, falls prevention, ECS, adapted dressing techinques, post hip pcns, PWBing status Other Exercises: LBD, hair brushing,  self-drinking, sitting balance/tolerance   Shoulder Instructions      Home Living Family/patient expects to be discharged to:: Private residence Living Arrangements: Spouse/significant other Available Help at Discharge: Family Type of Home: House Home Access: Stairs to enter Technical brewer of Steps: 1 Entrance Stairs-Rails: None Home Layout: Two level;Able to live on main level with bedroom/bathroom Alternate Level Stairs-Number of Steps: flight             Home Equipment: Walker - 2 wheels          Prior Functioning/Environment Level of Independence: Independent        Comments: reports no recent falls. Indep prior to admission        OT Problem List: Decreased range of motion;Decreased strength;Decreased activity tolerance;Impaired balance (sitting and/or standing);Decreased coordination;Decreased safety awareness;Decreased knowledge of use of DME or AE      OT Treatment/Interventions: Self-care/ADL training;Therapeutic exercise;Energy conservation;DME and/or AE instruction;Therapeutic activities;Patient/family education;Balance training    OT Goals(Current goals can be found in the care plan section) Acute Rehab OT Goals Patient Stated Goal: to go home and not SNF OT Goal Formulation: With patient Time For Goal Achievement: 10/07/20 Potential to Achieve Goals: Good ADL Goals Pt Will Perform Grooming: sitting;with modified independence Pt Will Perform Lower Body Dressing: with mod assist;sitting/lateral leans Pt Will Transfer to Toilet: stand pivot transfer;bedside commode;with mod assist (c LRAD PRN)  OT Frequency: Min 2X/week   Barriers to D/C: Inaccessible home environment             AM-PAC OT "6 Clicks" Daily Activity     Outcome Measure Help from another person eating meals?: A Little Help from another person taking care of personal grooming?: A Little Help from another person toileting, which includes using toliet, bedpan, or urinal?: A  Lot Help from another person bathing (including washing, rinsing, drying)?: A Lot Help from another person to put on and taking off regular upper body clothing?: A Lot Help from another person to put on and taking off regular lower body clothing?: A Lot 6 Click Score: 14   End of Session Nurse Communication: Mobility status  Activity Tolerance: Patient tolerated treatment well Patient left: in chair;with call bell/phone within reach;with chair alarm set  OT Visit Diagnosis: Other abnormalities of gait and mobility (R26.89);Muscle weakness (generalized) (M62.81)                Time: 4496-7591 OT Time Calculation (min): 22 min Charges:  OT General Charges $OT Visit: 1 Visit OT Evaluation $OT Eval Low Complexity: 1 Low OT Treatments $Self Care/Home Management : 8-22 mins   Dessie Coma, M.S. OTR/L  09/23/20, 9:25 AM  ascom [phone]

## 2020-09-23 NOTE — Progress Notes (Signed)
Physical Therapy Treatment Patient Details Name: Jasmine Colon MRN: [id number] DOB: 10/08/1940 Today's Date: 09/23/2020    History of Present Illness Pt admitted for L femoral neck fracture s/p fall. Pt now with L hip hemiarthroplasty. HIstory of Parkinsons disease (takes meds 6xs/day), asthma, and HLD.    PT Comments    Tech in asking for assist to get up for breakfast.  Transitioned to sitting with mod/max a x 2.  Very "rolling" Parkinsons movements making sitting unsupported or without +2 assist unsafe.  She is able to transfer to recliner with +2 HHA PWB LLE.  OT in after seated and to remain with pt for a while to access chair safety.     Follow Up Recommendations  SNF     Equipment Recommendations   (TBD)    Recommendations for Other Services       Precautions / Restrictions Precautions Precautions: Fall;Posterior Hip Restrictions Weight Bearing Restrictions: Yes LLE Weight Bearing: Partial weight bearing    Mobility  Bed Mobility Overal bed mobility: Needs Assistance Bed Mobility: Supine to Sit                Transfers Overall transfer level: Needs assistance       Stand pivot transfers: Mod assist;+2 physical assistance;Max assist          Ambulation/Gait                 Stairs             Wheelchair Mobility    Modified Rankin (Stroke Patients Only)       Balance Overall balance assessment: Needs assistance Sitting-balance support: Feet supported;Bilateral upper extremity supported Sitting balance-Leahy Scale: Poor     Standing balance support: Bilateral upper extremity supported Standing balance-Leahy Scale: Zero Standing balance comment: full assist from staff to remain upright                            Cognition Arousal/Alertness: Awake/alert Behavior During Therapy: [facility] for tasks assessed/performed;Restless Overall Cognitive Status: Within Functional Limits for tasks assessed                                         Exercises      General Comments        Pertinent Vitals/Pain Pain Assessment: Faces Faces Pain Scale: Hurts even more Pain Location: L Hip - with sitting Pain Descriptors / Indicators: Aching;Discomfort;Dull;Operative site guarding Pain Intervention(s): Limited activity within patient's tolerance;Monitored during session;Repositioned    Home Living                      Prior Function            PT Goals (current goals can now be found in the care plan section) Progress towards PT goals: Progressing toward goals    Frequency    BID      PT Plan      Co-evaluation              AM-PAC PT "6 Clicks" Mobility   Outcome Measure  Help needed turning from your back to your side while in a flat bed without using bedrails?: A Lot Help needed moving from lying on your back to sitting on the side of a flat bed without using bedrails?: Total Help needed moving to and from  a bed to a chair (including a wheelchair)?: Total Help needed standing up from a chair using your arms (e.g., wheelchair or bedside chair)?: Total Help needed to walk in hospital room?: Total Help needed climbing 3-5 steps with a railing? : Total 6 Click Score: 7    End of Session Equipment Utilized During Treatment: Oxygen Activity Tolerance: Patient limited by pain;Treatment limited secondary to medical complications (Comment) Patient left: in bed;with bed alarm set;with SCD's reapplied Nurse Communication: Mobility status PT Visit Diagnosis: Muscle weakness (generalized) (M62.81);History of falling (Z91.81);Difficulty in walking, not elsewhere classified (R26.2);Pain Pain - Right/Left: Left Pain - part of body: Hip     Time: 8828-0034 PT Time Calculation (min) (ACUTE ONLY): 14 min  Charges:  $Therapeutic Activity: 8-22 mins                    Chesley Noon, PTA 09/23/20, 1:49 PM

## 2020-09-23 NOTE — Progress Notes (Signed)
Physical Therapy Treatment Patient Details Name: Jasmine Colon MRN: [id number] DOB: 09/19/1940 Today's Date: 09/23/2020    History of Present Illness Pt admitted for L femoral neck fracture s/p fall. Pt now with L hip hemiarthroplasty. HIstory of Parkinsons disease (takes meds 6xs/day), asthma, and HLD.    PT Comments    Pt OOB x 5 hours.  Assisted back to bed with mod/max a x 2.  Movements do seem more controlled this pm.  Returned to supine and pericare given.  Needs met.  She continues to need +2 assist for transfers and would be unsafe to try without +2 given Parkinson's movements.  She stated she does take medication but does not feel it helps control them much.   Follow Up Recommendations  SNF     Equipment Recommendations   (TBD)    Recommendations for Other Services       Precautions / Restrictions Precautions Precautions: Fall;Posterior Hip Restrictions Weight Bearing Restrictions: Yes LLE Weight Bearing: Partial weight bearing    Mobility  Bed Mobility Overal bed mobility: Needs Assistance Bed Mobility: Sit to Supine       Sit to supine: Max assist;+2 for physical assistance        Transfers Overall transfer level: Needs assistance   Transfers: Stand Pivot Transfers   Stand pivot transfers: Mod assist;+2 physical assistance;Max assist          Ambulation/Gait             General Gait Details: not safe to perform   Stairs             Wheelchair Mobility    Modified Rankin (Stroke Patients Only)       Balance Overall balance assessment: Needs assistance Sitting-balance support: Feet supported;Bilateral upper extremity supported Sitting balance-Leahy Scale: Poor     Standing balance support: Bilateral upper extremity supported Standing balance-Leahy Scale: Zero Standing balance comment: full assist from staff to remain upright                            Cognition Arousal/Alertness: Awake/alert Behavior  During Therapy: [facility] for tasks assessed/performed;Restless Overall Cognitive Status: Within Functional Limits for tasks assessed                                        Exercises      General Comments        Pertinent Vitals/Pain Pain Assessment: Faces Faces Pain Scale: Hurts a little bit Pain Location: L Hip Pain Descriptors / Indicators: Aching;Discomfort;Dull;Operative site guarding Pain Intervention(s): Limited activity within patient's tolerance;Monitored during session;Repositioned    Home Living                      Prior Function            PT Goals (current goals can now be found in the care plan section) Progress towards PT goals: Progressing toward goals    Frequency    BID      PT Plan      Co-evaluation              AM-PAC PT "6 Clicks" Mobility   Outcome Measure  Help needed turning from your back to your side while in a flat bed without using bedrails?: A Lot Help needed moving from lying on your back to sitting on  the side of a flat bed without using bedrails?: Total Help needed moving to and from a bed to a chair (including a wheelchair)?: Total Help needed standing up from a chair using your arms (e.g., wheelchair or bedside chair)?: Total Help needed to walk in hospital room?: Total Help needed climbing 3-5 steps with a railing? : Total 6 Click Score: 7    End of Session Equipment Utilized During Treatment: Gait belt Activity Tolerance: Patient limited by pain;Treatment limited secondary to medical complications (Comment) Patient left: in bed;with bed alarm set;with SCD's reapplied Nurse Communication: Mobility status PT Visit Diagnosis: Muscle weakness (generalized) (M62.81);History of falling (Z91.81);Difficulty in walking, not elsewhere classified (R26.2);Pain Pain - Right/Left: Left Pain - part of body: Hip     Time: 5956-3875 PT Time Calculation (min) (ACUTE ONLY): 24 min  Charges:  $Therapeutic  Activity: 23-37 mins                    Chesley Noon, PTA 09/23/20, 1:53 PM

## 2020-09-24 DIAGNOSIS — W19XXXS Unspecified fall, sequela: Secondary | ICD-10-CM | POA: Diagnosis not present

## 2020-09-24 DIAGNOSIS — W19XXXD Unspecified fall, subsequent encounter: Secondary | ICD-10-CM | POA: Diagnosis not present

## 2020-09-24 DIAGNOSIS — G20 Parkinson's disease: Secondary | ICD-10-CM | POA: Diagnosis not present

## 2020-09-24 DIAGNOSIS — F05 Delirium due to known physiological condition: Secondary | ICD-10-CM | POA: Diagnosis not present

## 2020-09-24 DIAGNOSIS — E785 Hyperlipidemia, unspecified: Secondary | ICD-10-CM | POA: Diagnosis not present

## 2020-09-24 DIAGNOSIS — W19XXXA Unspecified fall, initial encounter: Secondary | ICD-10-CM | POA: Diagnosis not present

## 2020-09-24 DIAGNOSIS — R0902 Hypoxemia: Secondary | ICD-10-CM | POA: Diagnosis not present

## 2020-09-24 DIAGNOSIS — F339 Major depressive disorder, recurrent, unspecified: Secondary | ICD-10-CM | POA: Diagnosis not present

## 2020-09-24 DIAGNOSIS — R52 Pain, unspecified: Secondary | ICD-10-CM | POA: Diagnosis not present

## 2020-09-24 DIAGNOSIS — Z923 Personal history of irradiation: Secondary | ICD-10-CM | POA: Diagnosis not present

## 2020-09-24 DIAGNOSIS — Z043 Encounter for examination and observation following other accident: Secondary | ICD-10-CM | POA: Diagnosis not present

## 2020-09-24 DIAGNOSIS — S72002D Fracture of unspecified part of neck of left femur, subsequent encounter for closed fracture with routine healing: Secondary | ICD-10-CM | POA: Diagnosis not present

## 2020-09-24 DIAGNOSIS — M2550 Pain in unspecified joint: Secondary | ICD-10-CM | POA: Diagnosis not present

## 2020-09-24 DIAGNOSIS — Z888 Allergy status to other drugs, medicaments and biological substances status: Secondary | ICD-10-CM | POA: Diagnosis not present

## 2020-09-24 DIAGNOSIS — M1990 Unspecified osteoarthritis, unspecified site: Secondary | ICD-10-CM | POA: Diagnosis not present

## 2020-09-24 DIAGNOSIS — Z681 Body mass index (BMI) 19 or less, adult: Secondary | ICD-10-CM | POA: Diagnosis not present

## 2020-09-24 DIAGNOSIS — Z853 Personal history of malignant neoplasm of breast: Secondary | ICD-10-CM | POA: Diagnosis not present

## 2020-09-24 DIAGNOSIS — Z96642 Presence of left artificial hip joint: Secondary | ICD-10-CM | POA: Diagnosis not present

## 2020-09-24 DIAGNOSIS — S73005A Unspecified dislocation of left hip, initial encounter: Secondary | ICD-10-CM | POA: Diagnosis not present

## 2020-09-24 DIAGNOSIS — Z885 Allergy status to narcotic agent status: Secondary | ICD-10-CM | POA: Diagnosis not present

## 2020-09-24 DIAGNOSIS — D649 Anemia, unspecified: Secondary | ICD-10-CM | POA: Diagnosis not present

## 2020-09-24 DIAGNOSIS — Z66 Do not resuscitate: Secondary | ICD-10-CM | POA: Diagnosis not present

## 2020-09-24 DIAGNOSIS — S72002A Fracture of unspecified part of neck of left femur, initial encounter for closed fracture: Secondary | ICD-10-CM | POA: Diagnosis not present

## 2020-09-24 DIAGNOSIS — M25552 Pain in left hip: Secondary | ICD-10-CM | POA: Diagnosis not present

## 2020-09-24 DIAGNOSIS — R9431 Abnormal electrocardiogram [ECG] [EKG]: Secondary | ICD-10-CM | POA: Diagnosis not present

## 2020-09-24 DIAGNOSIS — M419 Scoliosis, unspecified: Secondary | ICD-10-CM | POA: Diagnosis not present

## 2020-09-24 DIAGNOSIS — W1830XA Fall on same level, unspecified, initial encounter: Secondary | ICD-10-CM | POA: Diagnosis present

## 2020-09-24 DIAGNOSIS — T84029A Dislocation of unspecified internal joint prosthesis, initial encounter: Secondary | ICD-10-CM | POA: Diagnosis not present

## 2020-09-24 DIAGNOSIS — M6281 Muscle weakness (generalized): Secondary | ICD-10-CM | POA: Diagnosis not present

## 2020-09-24 DIAGNOSIS — J310 Chronic rhinitis: Secondary | ICD-10-CM | POA: Diagnosis not present

## 2020-09-24 DIAGNOSIS — Y792 Prosthetic and other implants, materials and accessory orthopedic devices associated with adverse incidents: Secondary | ICD-10-CM | POA: Diagnosis present

## 2020-09-24 DIAGNOSIS — W010XXA Fall on same level from slipping, tripping and stumbling without subsequent striking against object, initial encounter: Secondary | ICD-10-CM | POA: Diagnosis not present

## 2020-09-24 DIAGNOSIS — Z4789 Encounter for other orthopedic aftercare: Secondary | ICD-10-CM | POA: Diagnosis not present

## 2020-09-24 DIAGNOSIS — N179 Acute kidney failure, unspecified: Secondary | ICD-10-CM | POA: Diagnosis not present

## 2020-09-24 DIAGNOSIS — D62 Acute posthemorrhagic anemia: Secondary | ICD-10-CM | POA: Diagnosis not present

## 2020-09-24 DIAGNOSIS — Z7982 Long term (current) use of aspirin: Secondary | ICD-10-CM | POA: Diagnosis not present

## 2020-09-24 DIAGNOSIS — Z79899 Other long term (current) drug therapy: Secondary | ICD-10-CM | POA: Diagnosis not present

## 2020-09-24 DIAGNOSIS — M9702XA Periprosthetic fracture around internal prosthetic left hip joint, initial encounter: Secondary | ICD-10-CM | POA: Diagnosis not present

## 2020-09-24 DIAGNOSIS — T84021D Dislocation of internal left hip prosthesis, subsequent encounter: Secondary | ICD-10-CM | POA: Diagnosis not present

## 2020-09-24 DIAGNOSIS — E440 Moderate protein-calorie malnutrition: Secondary | ICD-10-CM | POA: Diagnosis not present

## 2020-09-24 DIAGNOSIS — R2681 Unsteadiness on feet: Secondary | ICD-10-CM | POA: Diagnosis not present

## 2020-09-24 DIAGNOSIS — M810 Age-related osteoporosis without current pathological fracture: Secondary | ICD-10-CM | POA: Diagnosis not present

## 2020-09-24 DIAGNOSIS — Z20822 Contact with and (suspected) exposure to covid-19: Secondary | ICD-10-CM | POA: Diagnosis not present

## 2020-09-24 DIAGNOSIS — Z23 Encounter for immunization: Secondary | ICD-10-CM | POA: Diagnosis not present

## 2020-09-24 DIAGNOSIS — Z96649 Presence of unspecified artificial hip joint: Secondary | ICD-10-CM | POA: Diagnosis not present

## 2020-09-24 DIAGNOSIS — F17210 Nicotine dependence, cigarettes, uncomplicated: Secondary | ICD-10-CM | POA: Diagnosis not present

## 2020-09-24 DIAGNOSIS — Z803 Family history of malignant neoplasm of breast: Secondary | ICD-10-CM | POA: Diagnosis not present

## 2020-09-24 DIAGNOSIS — Z471 Aftercare following joint replacement surgery: Secondary | ICD-10-CM | POA: Diagnosis not present

## 2020-09-24 DIAGNOSIS — D509 Iron deficiency anemia, unspecified: Secondary | ICD-10-CM | POA: Diagnosis not present

## 2020-09-24 DIAGNOSIS — J45909 Unspecified asthma, uncomplicated: Secondary | ICD-10-CM | POA: Diagnosis not present

## 2020-09-24 DIAGNOSIS — Z7401 Bed confinement status: Secondary | ICD-10-CM | POA: Diagnosis not present

## 2020-09-24 DIAGNOSIS — G9341 Metabolic encephalopathy: Secondary | ICD-10-CM | POA: Diagnosis not present

## 2020-09-24 DIAGNOSIS — T84021A Dislocation of internal left hip prosthesis, initial encounter: Secondary | ICD-10-CM | POA: Diagnosis not present

## 2020-09-24 DIAGNOSIS — J302 Other seasonal allergic rhinitis: Secondary | ICD-10-CM | POA: Diagnosis not present

## 2020-09-24 MED ORDER — FERROUS SULFATE 325 (65 FE) MG PO TABS
325.0000 mg | ORAL_TABLET | Freq: Every day | ORAL | 3 refills | Status: AC
Start: 2020-09-25 — End: ?

## 2020-09-24 MED ORDER — RASAGILINE MESYLATE 1 MG PO TABS: 1.0000 mg | ORAL_TABLET | Freq: Every day | ORAL | 0 refills | Status: AC

## 2020-09-24 MED ORDER — ASPIRIN EC 81 MG PO TBEC: 81.0000 mg | DELAYED_RELEASE_TABLET | Freq: Two times a day (BID) | ORAL | Status: DC

## 2020-09-24 MED ORDER — ASPIRIN 81 MG PO TBEC
81.0000 mg | DELAYED_RELEASE_TABLET | Freq: Two times a day (BID) | ORAL | 3 refills | Status: DC
Start: 2020-09-24 — End: 2020-09-29

## 2020-09-24 MED ORDER — CARBIDOPA-LEVODOPA 25-100 MG PO TABS: 1.0000 | ORAL_TABLET | Freq: Every day | ORAL | 0 refills | Status: AC

## 2020-09-24 MED ORDER — DOCUSATE SODIUM 100 MG PO CAPS: 100.0000 mg | ORAL_CAPSULE | Freq: Two times a day (BID) | ORAL | 0 refills | Status: DC

## 2020-09-24 MED ORDER — HYDROCODONE-ACETAMINOPHEN 5-325 MG PO TABS: 1.0000 | ORAL_TABLET | Freq: Four times a day (QID) | ORAL | 0 refills | Status: DC | PRN

## 2020-09-24 NOTE — Plan of Care (Signed)
  Problem: Education: Goal: Knowledge of General Education information will improve Description: Including pain rating scale, medication(s)/side effects and non-pharmacologic comfort measures Outcome: Adequate for Discharge   Problem: Health Behavior/Discharge Planning: Goal: Ability to manage health-related needs will improve Outcome: Adequate for Discharge   Problem: Clinical Measurements: Goal: Ability to maintain clinical measurements within normal limits will improve Outcome: Adequate for Discharge Goal: Will remain free from infection Outcome: Adequate for Discharge Goal: Diagnostic test results will improve Outcome: Adequate for Discharge Goal: Respiratory complications will improve Outcome: Adequate for Discharge Goal: Cardiovascular complication will be avoided Outcome: Adequate for Discharge   Problem: Activity: Goal: Risk for activity intolerance will decrease Outcome: Adequate for Discharge   Problem: Nutrition: Goal: Adequate nutrition will be maintained Outcome: Adequate for Discharge   Problem: Coping: Goal: Level of anxiety will decrease Outcome: Adequate for Discharge   Problem: Elimination: Goal: Will not experience complications related to bowel motility Outcome: Adequate for Discharge Goal: Will not experience complications related to urinary retention Outcome: Adequate for Discharge   Problem: Pain Managment: Goal: General experience of comfort will improve Outcome: Adequate for Discharge   Problem: Safety: Goal: Ability to remain free from injury will improve Outcome: Adequate for Discharge   Problem: Skin Integrity: Goal: Risk for impaired skin integrity will decrease Outcome: Adequate for Discharge   Problem: Acute Rehab PT Goals(only PT should resolve) Goal: Pt Will Go Supine/Side To Sit Outcome: Adequate for Discharge Goal: Pt Will Transfer Bed To Chair/Chair To Bed Outcome: Adequate for Discharge Goal: Pt/caregiver will Perform Home  Exercise Program Outcome: Adequate for Discharge   Problem: Acute Rehab OT Goals (only OT should resolve) Goal: Pt. Will Perform Grooming Outcome: Adequate for Discharge Goal: Pt. Will Perform Lower Body Dressing Outcome: Adequate for Discharge Goal: Pt. Will Transfer To Toilet Outcome: Adequate for Discharge

## 2020-09-24 NOTE — Discharge Summary (Signed)
[facility] NAME: Jasmine Colon    MR#:  [id number]  DATE OF BIRTH:  1940/05/30  DATE OF ADMISSION:  09/21/2020 ADMITTING PHYSICIAN: Christel Mormon, MD  DATE OF DISCHARGE: 09/24/2020  PRIMARY CARE PHYSICIAN: Dion Body, MD    ADMISSION DIAGNOSIS:  Fall [W19.XXXA] Closed left hip fracture ([place]) [S72.002A] Left displaced femoral neck fracture ([place]) [S72.002A]  DISCHARGE DIAGNOSIS:  close left displaced femoral neck fracture status post surgery  SECONDARY DIAGNOSIS:   Past Medical History:  Diagnosis Date  . Asthma 2012  . Hyperlipidemia   . Lesion of lateral popliteal nerve   . Lower back pain   . 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 Mitral valve disorder   . Osteoarthritis   . Osteoporosis   . Parkinson's disease ([place]) 2012  . Personal history of radiation therapy 2013   right breast ca with lumpectomy and rad tx    HOSPITAL COURSE:   Jasmine Budreau Kimreyis a 80 y.o.Caucasian femalewith medical history significant forasthma, dyslipidemia, osteoarthritis, osteoporosis, Parkinson's disease and right breast cancer status postlumpectomy and radiotherapy, who presented to the emergency room with acute onset of accidental mechanical fall at 7 PM and subsequent Left hip pain.  Closed left hip fracture. Mechanical fall at home -POD # 3 -Pain meds prn -Aspirin 81 mg bid for 6 weeks per dr Sabra Heck for DVT prophylaxis at discharge -Orthopedic consultation Dr. Sabra Heck s/p hip surgery. -on Lovenox for DVT proph--inhouse  Asthmawith mild hypoxia without acute exacerbation. -wean to RA -She has no other current pulmonary issues.  Leukocytosis. - likely secondary to stress demargination. -Her chest x-ray and urinalysis were unremarkable.  History of dyslipidemia. - not on any statins at home  Parkinson's  disease. Dyskinetic movement -cont Sinemet and Azilect. -- Patient follows with Dr. Manuella Ghazi neurology -- patient currently has dyskinesia likely due to inadequate dosing of Sinemet due to timing of her surgery. Appears she got only two doses yesterday instead of six --6/6-- patient doing well. Dyskinesia resolved. Working with physical therapy.  Tobacco abuse. - NicoDerm CQ patch onlyas needed. -She was counseled for smoking cessation  Chronic rhinitis. -continue Flonase nasal spray and Claritin.  DVT prophylaxis: Lovenox  code Status:The patient is DNR/DNI.  Family Communication: spoke with daughter [name] Disposition Plan: rehab Consults called:Orthopedic consult. Status is: Inpatient  Inpatient level of care appropriate due to severity of illness   Dispo: The patient is from:Home Anticipated d/c is to:SNF Patient currently is medically improving and will discharge to [facility] today Difficult to place patient No   TOTAL TIME TAKING CARE OF THIS PATIENT: *35* minutes.  >50% time spent on counselling and coordination of care  CONSULTS OBTAINED:  Treatment Team:  Earnestine Leys, MD  DRUG ALLERGIES:   Allergies  Allergen Reactions  . Codeine     Feels weird  . Pseudoephedrine     Other reaction(s): Dizziness and giddiness (finding)    DISCHARGE MEDICATIONS:   Allergies as of 09/24/2020      Reactions   Codeine    Feels weird   Pseudoephedrine    Other reaction(s): Dizziness and giddiness (finding)      Medication List    STOP taking these medications   amoxicillin 500 MG capsule Commonly known as: AMOXIL   ibandronate 150 MG tablet Commonly known as: BONIVA   simvastatin 20 MG tablet Commonly  known as: ZOCOR     TAKE these medications   aspirin 81 MG EC tablet Take 1 tablet (81 mg total) by mouth 2 (two) times daily. For 6 weeks What changed:  when to take this additional instructions    CALTRATE 600+D PO Take 3 tablets by mouth 3 (three) times daily.   carbidopa-levodopa 25-100 MG tablet Commonly known as: SINEMET IR Take 1 tablet by mouth 6 (six) times daily. What changed: See the new instructions.   cyanocobalamin 1000 MCG tablet Take 100 mcg by mouth daily.   docusate sodium 100 MG capsule Commonly known as: COLACE Take 1 capsule (100 mg total) by mouth 2 (two) times daily.   ferrous sulfate 325 (65 FE) MG tablet Take 1 tablet (325 mg total) by mouth daily with breakfast. Start taking on: September 25, 2020   fluticasone 50 MCG/ACT nasal spray Commonly known as: FLONASE Place 2 sprays into both nostrils daily as needed for allergies or rhinitis. Notes to patient: Not given in hospital   HYDROcodone-acetaminophen 5-325 MG tablet Commonly known as: NORCO/VICODIN Take 1-2 tablets by mouth every 6 (six) hours as needed for moderate pain (pain score 4-6).   Ibuprofen 200 MG Caps Take 400 mg by mouth every 8 (eight) hours as needed.   loratadine 10 MG tablet Commonly known as: CLARITIN Take 10 mg by mouth daily as needed for allergies.   mirtazapine 30 MG tablet Commonly known as: REMERON Take 30 mg by mouth at bedtime.   PRESERVISION AREDS PO Take by mouth daily.   rasagiline 1 MG Tabs tablet Commonly known as: AZILECT Take 1 tablet (1 mg total) by mouth daily. Start taking on: September 25, 2020 What changed:  medication strength how much to take   sodium chloride 0.65 % Soln nasal spray Commonly known as: OCEAN Place 1 spray into both nostrils as needed for congestion.            Discharge Care Instructions  (From admission, onward)         Start     Ordered   09/24/20 0000  Discharge wound care:       Comments: Reinforce dressing until d/ced   09/24/20 1019          If you experience worsening of your admission symptoms, develop shortness of breath, life threatening emergency, suicidal or homicidal thoughts you must seek medical  attention immediately by calling 911 or calling your MD immediately  if symptoms less severe.  You Must read complete instructions/literature along with all the possible adverse reactions/side effects for all the Medicines you take and that have been prescribed to you. Take any new Medicines after you have completely understood and accept all the possible adverse reactions/side effects.   Please note  You were cared for by a hospitalist during your hospital stay. If you have any questions about your discharge medications or the care you received while you were in the hospital after you are discharged, you can call the unit and asked to speak with the hospitalist on call if the hospitalist that took care of you is not available. Once you are discharged, your primary care physician will handle any further medical issues. Please note that NO REFILLS for any discharge medications will be authorized once you are discharged, as it is imperative that you return to your primary care physician (or establish a relationship with a primary care physician if you do not have one) for your aftercare needs so that they can reassess  your need for medications and monitor your lab values. Today   SUBJECTIVE   Out in the chair doing overall well. Eating breakfast.  VITAL SIGNS:  Blood pressure 101/60, pulse 85, temperature 98.5 F (36.9 C), temperature source Oral, resp. rate 16, height 5\' 5"  (1.651 m), weight 51.6 kg, SpO2 95 %.  I/O:    Intake/Output Summary (Last 24 hours) at 09/24/2020 1025 Last data filed at 09/24/2020 0611 Gross per 24 hour  Intake --  Output 351 ml  Net -351 ml    PHYSICAL EXAMINATION:  GENERAL:  80 y.o.-year-old patient lying in the bed with no acute distress.  LUNGS: Normal breath sounds bilaterally, no wheezing, rales,rhonchi or crepitation. No use of accessory muscles of respiration.  CARDIOVASCULAR: S1, S2 normal. No murmurs, rubs, or gallops.  ABDOMEN: Soft, non-tender,  non-distended. Bowel sounds present. No organomegaly or mass.  EXTREMITIES: No pedal edema, cyanosis, or clubbing. DJD NEUROLOGIC: non-focal PSYCHIATRIC: The patient is alert and oriented x 3.  SKIN: No obvious rash, lesion, or ulcer.   DATA REVIEW:   CBC  Recent Labs  Lab 09/22/20 0630  WBC 7.7  HGB 9.7*  HCT 28.1*  PLT 217    Chemistries  Recent Labs  Lab 09/21/20 0821 09/22/20 0630  NA  --  135  K  --  3.8  CL  --  104  CO2  --  24  GLUCOSE  --  127*  BUN  --  14  CREATININE  --  0.74  CALCIUM  --  7.9*  AST 43*  --   ALT 13  --   ALKPHOS 58  --   BILITOT 1.6*  --     Microbiology Results   Recent Results (from the past 240 hour(s))  Resp Panel by RT-PCR (Flu A&B, Covid) Nasopharyngeal Swab     Status: None   Collection Time: 09/21/20  1:53 AM   Specimen: Nasopharyngeal Swab; Nasopharyngeal(NP) swabs in vial transport medium  Result Value Ref Range Status   SARS Coronavirus 2 by RT PCR NEGATIVE NEGATIVE Final    Comment: (NOTE) SARS-CoV-2 target nucleic acids are NOT DETECTED.  The SARS-CoV-2 RNA is generally detectable in upper respiratory specimens during the acute phase of infection. The lowest concentration of SARS-CoV-2 viral copies this assay can detect is 138 copies/mL. A negative result does not preclude SARS-Cov-2 infection and should not be used as the sole basis for treatment or other patient management decisions. A negative result may occur with  improper specimen collection/handling, submission of specimen other than nasopharyngeal swab, presence of viral mutation(s) within the areas targeted by this assay, and inadequate number of viral copies(<138 copies/mL). A negative result must be combined with clinical observations, patient history, and epidemiological information. The expected result is Negative.  Fact Sheet for Patients:  EntrepreneurPulse.com.au  Fact Sheet for Healthcare Providers:   IncredibleEmployment.be  This test is no t yet approved or cleared by the [place] FDA and  has been authorized for detection and/or diagnosis of SARS-CoV-2 by FDA under an Emergency Use Authorization (EUA). This EUA will remain  in effect (meaning this test can be used) for the duration of the COVID-19 declaration under Section 564(b)(1) of the Act, 21 U.S.C.section 360bbb-3(b)(1), unless the authorization is terminated  or revoked sooner.       Influenza A by PCR NEGATIVE NEGATIVE Final   Influenza B by PCR NEGATIVE NEGATIVE Final    Comment: (NOTE) The Xpert Xpress SARS-CoV-2/FLU/RSV plus assay is intended as an  aid in the diagnosis of influenza from Nasopharyngeal swab specimens and should not be used as a sole basis for treatment. Nasal washings and aspirates are unacceptable for Xpert Xpress SARS-CoV-2/FLU/RSV testing.  Fact Sheet for Patients: EntrepreneurPulse.com.au  Fact Sheet for Healthcare Providers: IncredibleEmployment.be  This test is not yet approved or cleared by the [place] FDA and has been authorized for detection and/or diagnosis of SARS-CoV-2 by FDA under an Emergency Use Authorization (EUA). This EUA will remain in effect (meaning this test can be used) for the duration of the COVID-19 declaration under Section 564(b)(1) of the Act, 21 U.S.C. section 360bbb-3(b)(1), unless the authorization is terminated or revoked.  Performed at [facility], [address] [place], [place] [address]   Surgical PCR screen     Status: None   Collection Time: 09/21/20  7:20 AM   Specimen: Nasal Mucosa; Nasal Swab  Result Value Ref Range Status   MRSA, PCR NEGATIVE NEGATIVE Final   Staphylococcus aureus NEGATIVE NEGATIVE Final    Comment: (NOTE) The Xpert SA Assay (FDA approved for NASAL specimens in patients 96 years of age and older), is one component of a comprehensive surveillance  program. It is not intended to diagnose infection nor to guide or monitor treatment. Performed at [facility], [place], [place] [address]   Resp Panel by RT-PCR (Flu A&B, Covid) Nasopharyngeal Swab     Status: None   Collection Time: 09/23/20 11:43 AM   Specimen: Nasopharyngeal Swab; Nasopharyngeal(NP) swabs in vial transport medium  Result Value Ref Range Status   SARS Coronavirus 2 by RT PCR NEGATIVE NEGATIVE Final    Comment: (NOTE) SARS-CoV-2 target nucleic acids are NOT DETECTED.  The SARS-CoV-2 RNA is generally detectable in upper respiratory specimens during the acute phase of infection. The lowest concentration of SARS-CoV-2 viral copies this assay can detect is 138 copies/mL. A negative result does not preclude SARS-Cov-2 infection and should not be used as the sole basis for treatment or other patient management decisions. A negative result may occur with  improper specimen collection/handling, submission of specimen other than nasopharyngeal swab, presence of viral mutation(s) within the areas targeted by this assay, and inadequate number of viral copies(<138 copies/mL). A negative result must be combined with clinical observations, patient history, and epidemiological information. The expected result is Negative.  Fact Sheet for Patients:  EntrepreneurPulse.com.au  Fact Sheet for Healthcare Providers:  IncredibleEmployment.be  This test is no t yet approved or cleared by the [place] FDA and  has been authorized for detection and/or diagnosis of SARS-CoV-2 by FDA under an Emergency Use Authorization (EUA). This EUA will remain  in effect (meaning this test can be used) for the duration of the COVID-19 declaration under Section 564(b)(1) of the Act, 21 U.S.C.section 360bbb-3(b)(1), unless the authorization is terminated  or revoked sooner.       Influenza A by PCR NEGATIVE NEGATIVE Final    Influenza B by PCR NEGATIVE NEGATIVE Final    Comment: (NOTE) The Xpert Xpress SARS-CoV-2/FLU/RSV plus assay is intended as an aid in the diagnosis of influenza from Nasopharyngeal swab specimens and should not be used as a sole basis for treatment. Nasal washings and aspirates are unacceptable for Xpert Xpress SARS-CoV-2/FLU/RSV testing.  Fact Sheet for Patients: EntrepreneurPulse.com.au  Fact Sheet for Healthcare Providers: IncredibleEmployment.be  This test is not yet approved or cleared by the [place] FDA and has been authorized for detection and/or diagnosis of SARS-CoV-2 by FDA under an Emergency  Use Authorization (EUA). This EUA will remain in effect (meaning this test can be used) for the duration of the COVID-19 declaration under Section 564(b)(1) of the Act, 21 U.S.C. section 360bbb-3(b)(1), unless the authorization is terminated or revoked.  Performed at [facility], [address], [place], [place] [address]     RADIOLOGY:  No results found.   CODE STATUS:     Code Status Orders  (From admission, onward)         Start     Ordered   09/21/20 0749  Do not attempt resuscitation (DNR)  Continuous       Question Answer Comment  In the event of cardiac or respiratory ARREST Do not call a "code blue"   In the event of cardiac or respiratory ARREST Do not perform Intubation, CPR, defibrillation or ACLS   In the event of cardiac or respiratory ARREST Use medication by any route, position, wound care, and other measures to relive pain and suffering. May use oxygen, suction and manual treatment of airway obstruction as needed for comfort.      09/21/20 0748        Code Status History    Date Active Date Inactive Code Status Order ID Comments User Context   09/21/2020 0710 09/21/2020 0748 Full Code [id number]  Mansy, Arvella Merles, MD Inpatient   Advance Care Planning Activity       TOTAL TIME TAKING CARE OF THIS  PATIENT: 40 minutes.    Fritzi Mandes M.D  [facility]    CC: Primary care physician; Dion Body, MD

## 2020-09-24 NOTE — TOC Progression Note (Addendum)
Transition of Care [facility]) - Progression Note    Patient Details  Name: Athziri Freundlich MRN: [id number] Date of Birth: [birth year]  Transition of Care [facility]) CM/SW [place], RN Phone Number: 09/24/2020, 10:23 AM  Clinical Narrative:   Received notification from [facility] with Ins approval auth number [id number], EMS auth number [id number] Notified Seth Bake at [facility], and the physician as well as the patient and daughter [name] CM called EMS and she is the first on the list to be picked up       Expected Discharge Plan and Services           Expected Discharge Date: 09/24/20                                     Social Determinants of Health (SDOH) Interventions    Readmission Risk Interventions No flowsheet data found.

## 2020-09-24 NOTE — Care Management Important Message (Signed)
Important Message  Patient Details  Name: Jasmine Colon MRN: [id number] Date of Birth: April 11, 1941   Medicare Important Message Given:        Juliann Pulse A Demarr Kluever 09/24/2020, 11:13 AM

## 2020-09-24 NOTE — Progress Notes (Signed)
Occupational Therapy Treatment Patient Details Name: Jasmine Colon MRN: [id number] DOB: 05-13-1940 Today's Date: 09/24/2020    History of present illness Pt admitted for L femoral neck fracture s/p fall. Pt now with L hip hemiarthroplasty. HIstory of Parkinsons disease (takes meds 6xs/day), asthma, and HLD.   OT comments  Jasmine Colon was seen for OT treatment on this date. Upon arrival pt sleeping, wakes easily to light and agreeable to tx session. Pt is pleasant and presents with greatly improved dyskinesia. Pt requires MOD A sup>sit, assist for BLE mgmt to maintain post hip pcns. SETUP + SUPERVISION grooming and self-drinking sitting EOB - pt tolerates ~10 min grooming with good balance. MIN A sit<>stand and SPT bed>chair. Pt left in chair with PT at bedside. Pt making good progress toward goals. Pt continues to benefit from skilled OT services to maximize return to PLOF and minimize risk of future falls, injury, caregiver burden, and readmission. Will continue to follow POC. Discharge recommendation remains appropriate.    Follow Up Recommendations  SNF;Supervision/Assistance - 24 hour    Equipment Recommendations  3 in 1 bedside commode    Recommendations for Other Services      Precautions / Restrictions Precautions Precautions: Fall;Posterior Hip Restrictions Weight Bearing Restrictions: Yes LLE Weight Bearing: Partial weight bearing       Mobility Bed Mobility Overal bed mobility: Needs Assistance Bed Mobility: Sit to Supine     Supine to sit: Mod assist     General bed mobility comments: assist for BLE, to maintain post hip pcns    Transfers Overall transfer level: Needs assistance   Transfers: Sit to/from Stand;Stand Pivot Transfers Sit to Stand: Min assist Stand pivot transfers: Min assist            Balance Overall balance assessment: Needs assistance Sitting-balance support: Feet supported;Bilateral upper extremity supported Sitting balance-Leahy  Scale: Good     Standing balance support: Bilateral upper extremity supported Standing balance-Leahy Scale: Poor                             ADL either performed or assessed with clinical judgement   ADL Overall ADL's : Needs assistance/impaired                                       General ADL Comments: SETUP + SUPERVISION grooming and self-drinking sitting EOB - pt tolerates ~10 min grooming with good balance. MIN A for ADL t/f               Cognition Arousal/Alertness: Awake/alert Behavior During Therapy: WFL for tasks assessed/performed;Restless Overall Cognitive Status: Within Functional Limits for tasks assessed                                          Exercises Exercises: Other exercises Other Exercises Other Exercises: Pt edcuated re: OT role, DME recs, d/c recs, falls prevention, adapted dressing techinques, post hip pcns, PWBing status Other Exercises: Tooth brushing, hair brushing, self-drinking, sitting/standing balance/tolerance, sup>sit, sit<>stand, SPT           Pertinent Vitals/ Pain       Pain Assessment: Faces Faces Pain Scale: Hurts a little bit Pain Location: L Hip Pain Descriptors / Indicators: Aching;Discomfort;Dull;Operative site guarding Pain Intervention(s): Limited activity  within patient's tolerance;Repositioned         Frequency  Min 2X/week        Progress Toward Goals  OT Goals(current goals can now be found in the care plan section)  Progress towards OT goals: Progressing toward goals  Acute Rehab OT Goals Patient Stated Goal: to go home OT Goal Formulation: With patient Time For Goal Achievement: 10/07/20 Potential to Achieve Goals: Good ADL Goals Pt Will Perform Grooming: sitting;with modified independence Pt Will Perform Lower Body Dressing: with mod assist;sitting/lateral leans Pt Will Transfer to Toilet: stand pivot transfer;bedside commode;with mod assist  Plan Discharge  plan remains appropriate;Frequency remains appropriate       AM-PAC OT "6 Clicks" Daily Activity     Outcome Measure   Help from another person eating meals?: None Help from another person taking care of personal grooming?: A Little Help from another person toileting, which includes using toliet, bedpan, or urinal?: A Lot Help from another person bathing (including washing, rinsing, drying)?: A Lot Help from another person to put on and taking off regular upper body clothing?: A Little Help from another person to put on and taking off regular lower body clothing?: A Lot 6 Click Score: 16    End of Session    OT Visit Diagnosis: Other abnormalities of gait and mobility (R26.89);Muscle weakness (generalized) (M62.81)   Activity Tolerance Patient tolerated treatment well   Patient Left in chair;with call bell/phone within reach;Other (comment) (PT in room)   Nurse Communication Mobility status        Time: 4163-8453 OT Time Calculation (min): 27 min  Charges: OT General Charges $OT Visit: 1 Visit OT Treatments $Self Care/Home Management : 23-37 mins   Dessie Coma, M.S. OTR/L  09/24/20, 9:23 AM  ascom [phone]

## 2020-09-24 NOTE — Progress Notes (Signed)
Physical Therapy Treatment Patient Details Name: Jasmine Colon MRN: [id number] DOB: 1940/09/10 Today's Date: 09/24/2020    History of Present Illness Pt admitted for L femoral neck fracture s/p fall. Pt now with L hip hemiarthroplasty. HIstory of Parkinsons disease (takes meds 6xs/day), asthma, and HLD.    PT Comments    Pt is making good progress towards goals with ability to ambulate short distance in room. Only able to recall 1/3 hip precautions at this time. Much improved dyskinesia this date with decreased involuntary movement. Good endurance with there-ex. Eager to dc to SNF this date. Will continue to progress as able.  Follow Up Recommendations  SNF     Equipment Recommendations  None recommended by PT    Recommendations for Other Services       Precautions / Restrictions Precautions Precautions: Fall;Posterior Hip Precaution Booklet Issued: Yes (comment) Restrictions Weight Bearing Restrictions: Yes LLE Weight Bearing: Partial weight bearing    Mobility  Bed Mobility Overal bed mobility: Needs Assistance Bed Mobility: Sit to Supine     Supine to sit: Mod assist     General bed mobility comments: received in recliner, not performed    Transfers Overall transfer level: Needs assistance Equipment used: 1 person hand held assist Transfers: Sit to/from Stand Sit to Stand: Min assist Stand pivot transfers: Min assist       General transfer comment: able to stand with upright posture. Nervous about attempting mobility  Ambulation/Gait Ambulation/Gait assistance: Min assist Gait Distance (Feet): 10 Feet Assistive device: 1 person hand held assist Gait Pattern/deviations: Step-to pattern     General Gait Details: ambulated with HHA in room. Short, festinating gait pattern with cues for sequencing. Prefers HHA vs RW. Fatigues with increased distance. Cues for hip precautions during turns   Stairs             Wheelchair Mobility    Modified  Rankin (Stroke Patients Only)       Balance Overall balance assessment: Needs assistance Sitting-balance support: Feet supported;Bilateral upper extremity supported Sitting balance-Leahy Scale: Good     Standing balance support: Bilateral upper extremity supported Standing balance-Leahy Scale: Fair                              Cognition Arousal/Alertness: Awake/alert Behavior During Therapy: WFL for tasks assessed/performed;Restless Overall Cognitive Status: Within Functional Limits for tasks assessed                                        Exercises Other Exercises Other Exercises: Pt edcuated re: OT role, DME recs, d/c recs, falls prevention, adapted dressing techinques, post hip pcns, PWBing status Other Exercises: Tooth brushing, hair brushing, self-drinking, sitting/standing balance/tolerance, sup>sit, sit<>stand, SPT Other Exercises: Seated ther-ex performed on L LE including AP, quad sets, hip abd/add, SAQ, and LAQ. All ther-ex performed x 12 reps with cues for sequencing.    General Comments        Pertinent Vitals/Pain Pain Assessment: 0-10 Pain Score: 3  Faces Pain Scale: Hurts a little bit Pain Location: L Hip Pain Descriptors / Indicators: Aching;Discomfort;Dull;Operative site guarding Pain Intervention(s): Limited activity within patient's tolerance;Premedicated before session    Home Living                      Prior Function  PT Goals (current goals can now be found in the care plan section) Acute Rehab PT Goals Patient Stated Goal: to go home PT Goal Formulation: With patient Time For Goal Achievement: 10/06/20 Potential to Achieve Goals: Good Progress towards PT goals: Progressing toward goals    Frequency    BID      PT Plan Current plan remains appropriate    Co-evaluation              AM-PAC PT "6 Clicks" Mobility   Outcome Measure  Help needed turning from your back to your  side while in a flat bed without using bedrails?: A Lot Help needed moving from lying on your back to sitting on the side of a flat bed without using bedrails?: A Lot Help needed moving to and from a bed to a chair (including a wheelchair)?: A Little Help needed standing up from a chair using your arms (e.g., wheelchair or bedside chair)?: A Little Help needed to walk in hospital room?: A Little Help needed climbing 3-5 steps with a railing? : Total 6 Click Score: 14    End of Session Equipment Utilized During Treatment: Gait belt Activity Tolerance: Patient limited by pain Patient left: in chair;with chair alarm set Nurse Communication: Mobility status PT Visit Diagnosis: Muscle weakness (generalized) (M62.81);History of falling (Z91.81);Difficulty in walking, not elsewhere classified (R26.2);Pain Pain - Right/Left: Left Pain - part of body: Hip     Time: 3704-8889 PT Time Calculation (min) (ACUTE ONLY): 23 min  Charges:  $Gait Training: 8-22 mins $Therapeutic Exercise: 8-22 mins                     Greggory Stallion, PT, DPT [phone]    Jasmine Colon 09/24/2020, 12:38 PM

## 2020-09-24 NOTE — Care Management Important Message (Signed)
Important Message  Patient Details  Name: Jasmine Colon MRN: [id number] Date of Birth: [birth year]   Medicare Important Message Given:  Yes  Re-entered as system didn't recognize IM given this morning.   Juliann Pulse A Ricquel Foulk 09/24/2020, 2:00 PM

## 2020-09-25 DIAGNOSIS — M9702XA Periprosthetic fracture around internal prosthetic left hip joint, initial encounter: Secondary | ICD-10-CM | POA: Diagnosis not present

## 2020-09-25 LAB — SURGICAL PATHOLOGY

## 2020-09-26 DIAGNOSIS — R52 Pain, unspecified: Secondary | ICD-10-CM | POA: Diagnosis not present

## 2020-09-26 DIAGNOSIS — M25552 Pain in left hip: Secondary | ICD-10-CM | POA: Diagnosis not present

## 2020-09-26 DIAGNOSIS — W19XXXA Unspecified fall, initial encounter: Secondary | ICD-10-CM | POA: Diagnosis not present

## 2020-09-26 DIAGNOSIS — R0902 Hypoxemia: Secondary | ICD-10-CM | POA: Diagnosis not present

## 2020-09-27 ENCOUNTER — Encounter: Payer: Self-pay | Admitting: Emergency Medicine

## 2020-09-27 ENCOUNTER — Other Ambulatory Visit: Payer: Self-pay

## 2020-09-27 ENCOUNTER — Observation Stay: Payer: PPO | Admitting: Anesthesiology

## 2020-09-27 ENCOUNTER — Encounter
Admission: EM | Disposition: A | Discharge status: Skilled Nursing Facility | Payer: Self-pay | Source: Skilled Nursing Facility | Attending: Family Medicine

## 2020-09-27 ENCOUNTER — Observation Stay: Payer: PPO

## 2020-09-27 ENCOUNTER — Emergency Department: Payer: PPO

## 2020-09-27 ENCOUNTER — Inpatient Hospital Stay
Admission: EM | Admit: 2020-09-27 | Discharge: 2020-09-29 | DRG: 559 | Disposition: A | Discharge status: Skilled Nursing Facility | Payer: PPO | Source: Skilled Nursing Facility | Attending: Family Medicine | Admitting: Family Medicine

## 2020-09-27 DIAGNOSIS — Z20822 Contact with and (suspected) exposure to covid-19: Secondary | ICD-10-CM | POA: Diagnosis present

## 2020-09-27 DIAGNOSIS — F339 Major depressive disorder, recurrent, unspecified: Secondary | ICD-10-CM | POA: Diagnosis not present

## 2020-09-27 DIAGNOSIS — R9431 Abnormal electrocardiogram [ECG] [EKG]: Secondary | ICD-10-CM | POA: Diagnosis not present

## 2020-09-27 DIAGNOSIS — T84021A Dislocation of internal left hip prosthesis, initial encounter: Principal | ICD-10-CM | POA: Diagnosis present

## 2020-09-27 DIAGNOSIS — S73005A Unspecified dislocation of left hip, initial encounter: Secondary | ICD-10-CM

## 2020-09-27 DIAGNOSIS — G20 Parkinson's disease: Secondary | ICD-10-CM | POA: Diagnosis present

## 2020-09-27 DIAGNOSIS — M1990 Unspecified osteoarthritis, unspecified site: Secondary | ICD-10-CM | POA: Diagnosis present

## 2020-09-27 DIAGNOSIS — F05 Delirium due to known physiological condition: Secondary | ICD-10-CM | POA: Diagnosis not present

## 2020-09-27 DIAGNOSIS — E785 Hyperlipidemia, unspecified: Secondary | ICD-10-CM | POA: Diagnosis present

## 2020-09-27 DIAGNOSIS — S72002A Fracture of unspecified part of neck of left femur, initial encounter for closed fracture: Secondary | ICD-10-CM | POA: Diagnosis not present

## 2020-09-27 DIAGNOSIS — Z96649 Presence of unspecified artificial hip joint: Secondary | ICD-10-CM

## 2020-09-27 DIAGNOSIS — M810 Age-related osteoporosis without current pathological fracture: Secondary | ICD-10-CM | POA: Diagnosis present

## 2020-09-27 DIAGNOSIS — W1830XA Fall on same level, unspecified, initial encounter: Secondary | ICD-10-CM | POA: Diagnosis present

## 2020-09-27 DIAGNOSIS — D62 Acute posthemorrhagic anemia: Secondary | ICD-10-CM | POA: Diagnosis not present

## 2020-09-27 DIAGNOSIS — G9341 Metabolic encephalopathy: Secondary | ICD-10-CM | POA: Diagnosis not present

## 2020-09-27 DIAGNOSIS — Z885 Allergy status to narcotic agent status: Secondary | ICD-10-CM | POA: Diagnosis not present

## 2020-09-27 DIAGNOSIS — E440 Moderate protein-calorie malnutrition: Secondary | ICD-10-CM | POA: Diagnosis present

## 2020-09-27 DIAGNOSIS — Z471 Aftercare following joint replacement surgery: Secondary | ICD-10-CM | POA: Diagnosis not present

## 2020-09-27 DIAGNOSIS — J45909 Unspecified asthma, uncomplicated: Secondary | ICD-10-CM | POA: Diagnosis present

## 2020-09-27 DIAGNOSIS — Z681 Body mass index (BMI) 19 or less, adult: Secondary | ICD-10-CM | POA: Diagnosis not present

## 2020-09-27 DIAGNOSIS — D509 Iron deficiency anemia, unspecified: Secondary | ICD-10-CM | POA: Diagnosis not present

## 2020-09-27 DIAGNOSIS — D649 Anemia, unspecified: Secondary | ICD-10-CM

## 2020-09-27 DIAGNOSIS — Z923 Personal history of irradiation: Secondary | ICD-10-CM

## 2020-09-27 DIAGNOSIS — T84029A Dislocation of unspecified internal joint prosthesis, initial encounter: Secondary | ICD-10-CM

## 2020-09-27 DIAGNOSIS — Z803 Family history of malignant neoplasm of breast: Secondary | ICD-10-CM | POA: Diagnosis not present

## 2020-09-27 DIAGNOSIS — M419 Scoliosis, unspecified: Secondary | ICD-10-CM | POA: Diagnosis not present

## 2020-09-27 DIAGNOSIS — M6281 Muscle weakness (generalized): Secondary | ICD-10-CM | POA: Diagnosis not present

## 2020-09-27 DIAGNOSIS — Z4789 Encounter for other orthopedic aftercare: Secondary | ICD-10-CM | POA: Diagnosis not present

## 2020-09-27 DIAGNOSIS — J302 Other seasonal allergic rhinitis: Secondary | ICD-10-CM | POA: Diagnosis not present

## 2020-09-27 DIAGNOSIS — Z853 Personal history of malignant neoplasm of breast: Secondary | ICD-10-CM

## 2020-09-27 DIAGNOSIS — F17210 Nicotine dependence, cigarettes, uncomplicated: Secondary | ICD-10-CM | POA: Diagnosis present

## 2020-09-27 DIAGNOSIS — Z79899 Other long term (current) drug therapy: Secondary | ICD-10-CM | POA: Diagnosis not present

## 2020-09-27 DIAGNOSIS — Z888 Allergy status to other drugs, medicaments and biological substances status: Secondary | ICD-10-CM | POA: Diagnosis not present

## 2020-09-27 DIAGNOSIS — Z66 Do not resuscitate: Secondary | ICD-10-CM | POA: Diagnosis present

## 2020-09-27 DIAGNOSIS — R2681 Unsteadiness on feet: Secondary | ICD-10-CM | POA: Diagnosis not present

## 2020-09-27 DIAGNOSIS — Z043 Encounter for examination and observation following other accident: Secondary | ICD-10-CM | POA: Diagnosis not present

## 2020-09-27 DIAGNOSIS — Z7982 Long term (current) use of aspirin: Secondary | ICD-10-CM

## 2020-09-27 DIAGNOSIS — W19XXXA Unspecified fall, initial encounter: Secondary | ICD-10-CM

## 2020-09-27 DIAGNOSIS — Y792 Prosthetic and other implants, materials and accessory orthopedic devices associated with adverse incidents: Secondary | ICD-10-CM | POA: Diagnosis present

## 2020-09-27 DIAGNOSIS — W19XXXD Unspecified fall, subsequent encounter: Secondary | ICD-10-CM | POA: Diagnosis not present

## 2020-09-27 DIAGNOSIS — Z419 Encounter for procedure for purposes other than remedying health state, unspecified: Secondary | ICD-10-CM

## 2020-09-27 DIAGNOSIS — J310 Chronic rhinitis: Secondary | ICD-10-CM | POA: Diagnosis not present

## 2020-09-27 DIAGNOSIS — T84021D Dislocation of internal left hip prosthesis, subsequent encounter: Secondary | ICD-10-CM | POA: Diagnosis not present

## 2020-09-27 DIAGNOSIS — Z96642 Presence of left artificial hip joint: Secondary | ICD-10-CM | POA: Diagnosis not present

## 2020-09-27 DIAGNOSIS — S73006A Unspecified dislocation of unspecified hip, initial encounter: Secondary | ICD-10-CM | POA: Diagnosis present

## 2020-09-27 DIAGNOSIS — N179 Acute kidney failure, unspecified: Secondary | ICD-10-CM | POA: Diagnosis not present

## 2020-09-27 DIAGNOSIS — S72002D Fracture of unspecified part of neck of left femur, subsequent encounter for closed fracture with routine healing: Secondary | ICD-10-CM | POA: Diagnosis not present

## 2020-09-27 HISTORY — PX: HIP CLOSED REDUCTION: SHX983

## 2020-09-27 LAB — COMPREHENSIVE METABOLIC PANEL
ALT: 8 U/L (ref 0–44)
AST: 50 U/L — ABNORMAL HIGH (ref 15–41)
Albumin: 2.9 g/dL — ABNORMAL LOW (ref 3.5–5.0)
Alkaline Phosphatase: 68 U/L (ref 38–126)
Anion gap: 9 (ref 5–15)
BUN: 33 mg/dL — ABNORMAL HIGH (ref 8–23)
CO2: 23 mmol/L (ref 22–32)
Calcium: 8.2 mg/dL — ABNORMAL LOW (ref 8.9–10.3)
Chloride: 103 mmol/L (ref 98–111)
Creatinine, Ser: 1.03 mg/dL — ABNORMAL HIGH (ref 0.44–1.00)
GFR, Estimated: 56 mL/min — ABNORMAL LOW (ref >60)
Glucose, Bld: 135 mg/dL — ABNORMAL HIGH (ref 70–99)
Potassium: 4.2 mmol/L (ref 3.5–5.1)
Sodium: 135 mmol/L (ref 135–145)
Total Bilirubin: 1.6 mg/dL — ABNORMAL HIGH (ref 0.3–1.2)
Total Protein: 6.6 g/dL (ref 6.5–8.1)

## 2020-09-27 LAB — CBC WITH DIFFERENTIAL/PLATELET
Abs Immature Granulocytes: 0.3 10*3/uL — ABNORMAL HIGH (ref 0.00–0.07)
Basophils Absolute: 0 10*3/uL (ref 0.0–0.1)
Basophils Relative: 0 %
Eosinophils Absolute: 0.1 10*3/uL (ref 0.0–0.5)
Eosinophils Relative: 1 %
HCT: 23.1 % — ABNORMAL LOW (ref 36.0–46.0)
Hemoglobin: 7.9 g/dL — ABNORMAL LOW (ref 12.0–15.0)
Immature Granulocytes: 3 %
Lymphocytes Relative: 10 %
Lymphs Abs: 1 10*3/uL (ref 0.7–4.0)
MCH: 32.4 pg (ref 26.0–34.0)
MCHC: 34.2 g/dL (ref 30.0–36.0)
MCV: 94.7 fL (ref 80.0–100.0)
Monocytes Absolute: 0.9 10*3/uL (ref 0.1–1.0)
Monocytes Relative: 8 %
Neutro Abs: 8.2 10*3/uL — ABNORMAL HIGH (ref 1.7–7.7)
Neutrophils Relative %: 78 %
Platelets: 346 10*3/uL (ref 150–400)
RBC: 2.44 MIL/uL — ABNORMAL LOW (ref 3.87–5.11)
RDW: 14.1 % (ref 11.5–15.5)
WBC: 10.5 10*3/uL (ref 4.0–10.5)
nRBC: 0.4 % — ABNORMAL HIGH (ref 0.0–0.2)

## 2020-09-27 LAB — CBC
HCT: 21.2 % — ABNORMAL LOW (ref 36.0–46.0)
Hemoglobin: 7.2 g/dL — ABNORMAL LOW (ref 12.0–15.0)
MCH: 32.3 pg (ref 26.0–34.0)
MCHC: 34 g/dL (ref 30.0–36.0)
MCV: 95.1 fL (ref 80.0–100.0)
Platelets: 327 10*3/uL (ref 150–400)
RBC: 2.23 MIL/uL — ABNORMAL LOW (ref 3.87–5.11)
RDW: 14.2 % (ref 11.5–15.5)
WBC: 9.1 10*3/uL (ref 4.0–10.5)
nRBC: 0.3 % — ABNORMAL HIGH (ref 0.0–0.2)

## 2020-09-27 LAB — BASIC METABOLIC PANEL
Anion gap: 9 (ref 5–15)
BUN: 33 mg/dL — ABNORMAL HIGH (ref 8–23)
CO2: 26 mmol/L (ref 22–32)
Calcium: 8 mg/dL — ABNORMAL LOW (ref 8.9–10.3)
Chloride: 101 mmol/L (ref 98–111)
Creatinine, Ser: 1.01 mg/dL — ABNORMAL HIGH (ref 0.44–1.00)
GFR, Estimated: 57 mL/min — ABNORMAL LOW (ref >60)
Glucose, Bld: 128 mg/dL — ABNORMAL HIGH (ref 70–99)
Potassium: 4.4 mmol/L (ref 3.5–5.1)
Sodium: 136 mmol/L (ref 135–145)

## 2020-09-27 LAB — PROTIME-INR
INR: 1.1 (ref 0.8–1.2)
Prothrombin Time: 13.8 seconds (ref 11.4–15.2)

## 2020-09-27 LAB — SURGICAL PCR SCREEN
MRSA, PCR: NEGATIVE
Staphylococcus aureus: NEGATIVE

## 2020-09-27 LAB — RESP PANEL BY RT-PCR (FLU A&B, COVID) ARPGX2
Influenza A by PCR: NEGATIVE
Influenza B by PCR: NEGATIVE
SARS Coronavirus 2 by RT PCR: NEGATIVE

## 2020-09-27 LAB — TROPONIN I (HIGH SENSITIVITY)
Troponin I (High Sensitivity): 17 ng/L (ref <18)
Troponin I (High Sensitivity): 17 ng/L (ref <18)

## 2020-09-27 SURGERY — CLOSED REDUCTION, HIP
Anesthesia: General | Site: Hip | Laterality: Left

## 2020-09-27 MED ORDER — FERROUS SULFATE 325 (65 FE) MG PO TABS
325.0000 mg | ORAL_TABLET | Freq: Every day | ORAL | Status: DC
  Administered 2020-09-28 – 2020-09-29 (×2): 325 mg via ORAL
  Filled 2020-09-27 (×2): qty 1

## 2020-09-27 MED ORDER — METHOCARBAMOL 500 MG PO TABS
500.0000 mg | ORAL_TABLET | Freq: Four times a day (QID) | ORAL | Status: DC | PRN
  Administered 2020-09-28: 500 mg via ORAL
  Filled 2020-09-27 (×2): qty 1

## 2020-09-27 MED ORDER — PROPOFOL 10 MG/ML IV BOLUS
INTRAVENOUS | Status: AC
  Filled 2020-09-27: qty 20

## 2020-09-27 MED ORDER — CHLORHEXIDINE GLUCONATE 0.12 % MT SOLN
OROMUCOSAL | Status: AC
  Administered 2020-09-27: 15 mL via OROMUCOSAL
  Filled 2020-09-27: qty 15

## 2020-09-27 MED ORDER — PROPOFOL 10 MG/ML IV BOLUS
INTRAVENOUS | Status: DC | PRN
  Administered 2020-09-27: 40 mg via INTRAVENOUS
  Administered 2020-09-27: 20 mg via INTRAVENOUS
  Administered 2020-09-27: 40 mg via INTRAVENOUS

## 2020-09-27 MED ORDER — MUPIROCIN 2 % EX OINT
1.0000 "application " | TOPICAL_OINTMENT | Freq: Two times a day (BID) | CUTANEOUS | Status: DC
  Administered 2020-09-27 – 2020-09-29 (×4): 1 via NASAL
  Filled 2020-09-27: qty 22

## 2020-09-27 MED ORDER — MORPHINE SULFATE (PF) 2 MG/ML IV SOLN
0.5000 mg | INTRAVENOUS | Status: DC | PRN
  Administered 2020-09-27: 0.5 mg via INTRAVENOUS
  Filled 2020-09-27: qty 1

## 2020-09-27 MED ORDER — SUCCINYLCHOLINE CHLORIDE 20 MG/ML IJ SOLN
INTRAMUSCULAR | Status: DC | PRN
  Administered 2020-09-27: 100 mg via INTRAVENOUS

## 2020-09-27 MED ORDER — ASPIRIN EC 81 MG PO TBEC
81.0000 mg | DELAYED_RELEASE_TABLET | Freq: Two times a day (BID) | ORAL | Status: DC
  Administered 2020-09-27 – 2020-09-29 (×4): 81 mg via ORAL
  Filled 2020-09-27 (×4): qty 1

## 2020-09-27 MED ORDER — CARBIDOPA-LEVODOPA 25-100 MG PO TABS
1.0000 | ORAL_TABLET | Freq: Every day | ORAL | Status: DC
  Administered 2020-09-27 – 2020-09-29 (×13): 1 via ORAL
  Filled 2020-09-27 (×13): qty 1

## 2020-09-27 MED ORDER — LACTATED RINGERS IV SOLN: INTRAVENOUS | Status: DC

## 2020-09-27 MED ORDER — SEVOFLURANE IN SOLN
RESPIRATORY_TRACT | Status: AC
  Filled 2020-09-27: qty 250

## 2020-09-27 MED ORDER — SENNOSIDES-DOCUSATE SODIUM 8.6-50 MG PO TABS
1.0000 | ORAL_TABLET | Freq: Every evening | ORAL | Status: DC | PRN
  Administered 2020-09-27 – 2020-09-28 (×2): 1 via ORAL
  Filled 2020-09-27 (×2): qty 1

## 2020-09-27 MED ORDER — RASAGILINE MESYLATE 1 MG PO TABS
1.0000 mg | ORAL_TABLET | Freq: Every day | ORAL | Status: DC
  Administered 2020-09-28 – 2020-09-29 (×2): 1 mg via ORAL
  Filled 2020-09-27 (×3): qty 1

## 2020-09-27 MED ORDER — ADULT MULTIVITAMIN W/MINERALS CH
1.0000 | ORAL_TABLET | Freq: Every day | ORAL | Status: DC
  Administered 2020-09-28 – 2020-09-29 (×2): 1 via ORAL
  Filled 2020-09-27 (×2): qty 1

## 2020-09-27 MED ORDER — SODIUM CHLORIDE 0.9 % IV SOLN: INTRAVENOUS | Status: DC

## 2020-09-27 MED ORDER — DOCUSATE SODIUM 100 MG PO CAPS
100.0000 mg | ORAL_CAPSULE | Freq: Two times a day (BID) | ORAL | Status: DC
  Administered 2020-09-27 – 2020-09-29 (×4): 100 mg via ORAL
  Filled 2020-09-27 (×4): qty 1

## 2020-09-27 MED ORDER — HYDROCODONE-ACETAMINOPHEN 5-325 MG PO TABS
1.0000 | ORAL_TABLET | Freq: Four times a day (QID) | ORAL | Status: DC | PRN
  Administered 2020-09-27: 1 via ORAL
  Administered 2020-09-28: 2 via ORAL
  Filled 2020-09-27 (×2): qty 2

## 2020-09-27 MED ORDER — METHOCARBAMOL 1000 MG/10ML IJ SOLN
500.0000 mg | Freq: Four times a day (QID) | INTRAVENOUS | Status: DC | PRN
  Administered 2020-09-27: 500 mg via INTRAVENOUS
  Filled 2020-09-27 (×2): qty 5

## 2020-09-27 MED ORDER — ENSURE ENLIVE PO LIQD
237.0000 mL | Freq: Three times a day (TID) | ORAL | Status: DC
  Administered 2020-09-27 – 2020-09-29 (×4): 237 mL via ORAL

## 2020-09-27 MED ORDER — CHLORHEXIDINE GLUCONATE 0.12 % MT SOLN: 15.0000 mL | Freq: Once | OROMUCOSAL | Status: AC

## 2020-09-27 MED ORDER — FENTANYL CITRATE (PF) 100 MCG/2ML IJ SOLN
25.0000 ug | Freq: Once | INTRAMUSCULAR | Status: AC
Start: 2020-09-27 — End: 2020-09-27
  Administered 2020-09-27: 25 ug via INTRAVENOUS
  Filled 2020-09-27: qty 2

## 2020-09-27 SURGICAL SUPPLY — 3 items
KIT TURNOVER KIT A (KITS) ×3 IMPLANT
MANIFOLD NEPTUNE II (INSTRUMENTS) IMPLANT
PILLOW ABDUCTION FOAM SM (MISCELLANEOUS) ×3 IMPLANT

## 2020-09-27 NOTE — ED Notes (Signed)
Floor RN notified that patient daughter requesting to be called with updates regarding surgery. Contact information listed in chart. Receiving RN aware patient took home dose of medication and pharmacy was contacted regarding IV medication for muscle spasms.

## 2020-09-27 NOTE — H&P (Signed)
History and Physical    Jasmine Colon SWN:[id number] DOB: 1940-06-23 DOA: 09/27/2020  PCP: Dion Body, MD   Patient coming from: [facility]  I have personally briefly reviewed patient's old medical records in [place]  Chief Complaint: fall  HPI: Jasmine Colon is a 80 y.o. female with medical history significant for  asthma, dyslipidemia, osteoarthritis, osteoporosis, Parkinson's disease and right breast cancer status postlumpectomy and radiotherapy s/p left hemiarthroplasty on 09/22/2022 who presents from rehab at [facility] following an accidental fall during transfer landing on her left hip.  X-rays done at the facility revealed a left hip dislocation but she was sent to the emergency room for evaluation.  Patient did not hit her head and had no loss of consciousness.  She complains of pain in the hip but denies any pain elsewhere.  She denies chest pain, shortness of breath, nausea, vomiting, lightheadedness, abdominal pain or change in bowel habits, one-sided weakness numbness or tingling.  She was in her usual state of health prior to the fall. ED Course: On arrival, vitals within normal limits.  Blood work notable for hemoglobin of 7.9, down from 9.7  five days prior and creatinine 1.03 up from 0.74. EKG personally viewed and interpreted: Sinus at 93 with nonspecific ST-T wave changes Imaging: Unofficial report with prosthetic hip dislocation  The emergency room provider discussed findings with orthopedist, Dr. Sharlet Salina will arrange for reduction in the OR in the a.m.  Hospitalist consulted for admission.  Review of Systems: As per HPI otherwise all other systems on review of systems negative.    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 Hormone positive, HER 2 no amplified   Mitral valve  disorder    Osteoarthritis    Osteoporosis    Parkinson's disease ([place]) 2012   Personal history of radiation therapy 2013   right breast ca with lumpectomy and rad tx    Past Surgical History:  Procedure Laterality Date   BREAST BIOPSY Right 10/26/2011   invasive mammary carcinoma and DCIS   BREAST BIOPSY Left 01/09/2014   retroaerolar bx with Dr. Jamal Collin. Benign   BREAST LUMPECTOMY Right 11/13/2011   invasive mammary ca, DCIS, LCIS in specimen. Clear margins.  LN negative   BREAST SURGERY Right 2013   wide excision   CESAREAN SECTION  31 years ago   COLONOSCOPY  2008   HIP ARTHROPLASTY Left 09/21/2020   Procedure: ARTHROPLASTY BIPOLAR HIP (HEMIARTHROPLASTY);  Surgeon: Earnestine Leys, MD;  Location: [facility] ORS;  Service: Orthopedics;  Laterality: Left;     reports that she has been smoking cigarettes. She has a 22.50 pack-year smoking history. She has never used smokeless tobacco. She reports that she does not drink alcohol and does not use drugs.  Allergies  Allergen Reactions   Codeine     Feels weird   Pseudoephedrine     Other reaction(s): Dizziness and giddiness (finding)    Family History  Problem Relation Age of Onset   Stroke Mother    Heart failure Father    Heart disease Brother        bypass   Cancer Maternal Aunt        breast    Breast cancer Maternal Aunt    Cancer Maternal Aunt  breast   Breast cancer Maternal Aunt       Prior to Admission medications   Medication Sig Start Date End Date Taking? Authorizing Provider  aspirin EC 81 MG EC tablet Take 1 tablet (81 mg total) by mouth 2 (two) times daily. For 6 weeks 09/24/20   Fritzi Mandes, MD  Calcium Carbonate-Vitamin D (CALTRATE 600+D PO) Take 3 tablets by mouth 3 (three) times daily.     [provider]  carbidopa-levodopa (SINEMET IR) 25-100 MG tablet Take 1 tablet by mouth 6 (six) times daily. 09/24/20   Fritzi Mandes, MD  cyanocobalamin 1000 MCG tablet Take 100 mcg by mouth daily.     [provider]  docusate sodium (COLACE) 100 MG capsule Take 1 capsule (100 mg total) by mouth 2 (two) times daily. 09/24/20   Fritzi Mandes, MD  ferrous sulfate 325 (65 FE) MG tablet Take 1 tablet (325 mg total) by mouth daily with breakfast. 09/25/20   Fritzi Mandes, MD  fluticasone [facility]) 50 MCG/ACT nasal spray Place 2 sprays into both nostrils daily as needed for allergies or rhinitis.    [provider]  HYDROcodone-acetaminophen (NORCO/VICODIN) 5-325 MG tablet Take 1-2 tablets by mouth every 6 (six) hours as needed for moderate pain (pain score 4-6). 09/24/20   Fritzi Mandes, MD  Ibuprofen 200 MG CAPS Take 400 mg by mouth every 8 (eight) hours as needed.    [provider]  loratadine (CLARITIN) 10 MG tablet Take 10 mg by mouth daily as needed for allergies.    [provider]  mirtazapine (REMERON) 30 MG tablet Take 30 mg by mouth at bedtime. 06/27/20   [provider]  Multiple Vitamins-Minerals (PRESERVISION AREDS PO) Take by mouth daily.    [provider]  rasagiline (AZILECT) 1 MG TABS tablet Take 1 tablet (1 mg total) by mouth daily. 09/25/20   Fritzi Mandes, MD  sodium chloride (OCEAN) 0.65 % SOLN nasal spray Place 1 spray into both nostrils as needed for congestion.    [provider]    Physical Exam: Vitals:   09/27/20 0052 09/27/20 0100  BP: 125/79   Pulse: 98   Resp: 20   Temp: 98.3 F (36.8 C)   TempSrc: Oral   SpO2: 95%   Weight:  49.9 kg  Height:  5\' 5"  (1.651 m)     Vitals:   09/27/20 0052 09/27/20 0100  BP: 125/79   Pulse: 98   Resp: 20   Temp: 98.3 F (36.8 C)   TempSrc: Oral   SpO2: 95%   Weight:  49.9 kg  Height:  5\' 5"  (1.651 m)      Constitutional: Alert and oriented x 3 . Not in any apparent distress HEENT:      Head: Normocephalic and atraumatic.         Eyes: PERLA, EOMI, Conjunctivae are normal. Sclera is non-icteric.       Mouth/Throat: Mucous membranes are moist.       Neck: Supple  with no signs of meningismus. Cardiovascular: Regular rate and rhythm. No murmurs, gallops, or rubs. 2+ symmetrical distal pulses are present . No JVD. No LE edema Respiratory: Respiratory effort normal .Lungs sounds clear bilaterally. No wheezes, crackles, or rhonchi.  Gastrointestinal: Soft, non tender, and non distended with positive bowel sounds.  Genitourinary: No CVA tenderness. Musculoskeletal: LLE with deformity and pain with passive range of motion i. No cyanosis, or erythema of extremities. Neurologic:  Face is symmetric. Moving all extremities. No gross  focal neurologic deficits . Skin: Skin is warm, dry.  No rash or ulcers Psychiatric: Mood and affect are normal    Labs on Admission: I have personally reviewed following labs and imaging studies  CBC: Recent Labs  Lab 09/21/20 0153 09/21/20 0821 09/22/20 0630 09/27/20 0054  WBC 14.8* 8.6 7.7 10.5  NEUTROABS  --  6.2  --  8.2*  HGB 10.8* 10.7* 9.7* 7.9*  HCT 31.3* 30.4* 28.1* 23.1*  MCV 93.2 92.4 93.7 94.7  PLT 274 258 217 485   Basic Metabolic Panel: Recent Labs  Lab 09/21/20 0153 09/22/20 0630 09/27/20 0054  NA 135 135 135  K 4.0 3.8 4.2  CL 103 104 103  CO2 23 24 23   GLUCOSE 118* 127* 135*  BUN 24* 14 33*  CREATININE 0.84 0.74 1.03*  CALCIUM 8.6* 7.9* 8.2*   GFR: Estimated Creatinine Clearance: 36 mL/min (A) (by C-G formula based on SCr of 1.03 mg/dL (H)). Liver Function Tests: Recent Labs  Lab 09/21/20 0821 09/27/20 0054  AST 43* 50*  ALT 13 8  ALKPHOS 58 68  BILITOT 1.6* 1.6*  PROT 6.6 6.6  ALBUMIN 3.3* 2.9*   No results for input(s): LIPASE, AMYLASE in the last 168 hours. No results for input(s): AMMONIA in the last 168 hours. Coagulation Profile: Recent Labs  Lab 09/21/20 0153 09/27/20 0054  INR 1.1 1.1   Cardiac Enzymes: No results for input(s): CKTOTAL, CKMB, CKMBINDEX, TROPONINI in the last 168 hours. BNP (last 3 results) No results for input(s): PROBNP in the last 8760  hours. HbA1C: No results for input(s): HGBA1C in the last 72 hours. CBG: No results for input(s): GLUCAP in the last 168 hours. Lipid Profile: No results for input(s): CHOL, HDL, LDLCALC, TRIG, CHOLHDL, LDLDIRECT in the last 72 hours. Thyroid Function Tests: No results for input(s): TSH, T4TOTAL, FREET4, T3FREE, THYROIDAB in the last 72 hours. Anemia Panel: No results for input(s): VITAMINB12, FOLATE, FERRITIN, TIBC, IRON, RETICCTPCT in the last 72 hours. Urine analysis:    Component Value Date/Time   COLORURINE YELLOW (A) 09/21/2020 0153   APPEARANCEUR CLEAR (A) 09/21/2020 0153   LABSPEC 1.021 09/21/2020 0153   PHURINE 5.0 09/21/2020 0153   GLUCOSEU NEGATIVE 09/21/2020 0153   HGBUR NEGATIVE 09/21/2020 0153   BILIRUBINUR NEGATIVE 09/21/2020 0153   KETONESUR 5 (A) 09/21/2020 0153   PROTEINUR NEGATIVE 09/21/2020 0153   NITRITE NEGATIVE 09/21/2020 0153   LEUKOCYTESUR SMALL (A) 09/21/2020 0153    Radiological Exams on Admission: No results found.   Assessment/Plan 80 year old female with history of asthma, dyslipidemia, osteoarthritis, osteoporosis, Parkinson's disease and right breast cancer status postlumpectomy and radiotherapy s/p left hemiarthroplasty on 09/22/2022 presenting with left hip pain following an accidental fall .    Dislocation of hip prosthesis from accidental fall   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 - Pain control - Orthopedist to take to the OR for hip reduction in the a.m. - Keep n.p.o.  Anemia, postoperative - Hemoglobin 7.9, down from 9.5 on 6/4 - Serial hemoglobins and transfuse if still downtrending -Continue ferrous sulfate  AKI - Creatinine 1.03 up from 0.74 - Hydrate and monitor and avoid nephrotoxins    Parkinson disease ([place]) - Continue Sinemet and Azilect      DVT prophylaxis: SCDs Code Status: full code  Family Communication:  none  Disposition Plan: Back to previous home environment Consults called: Orthopedics Status:  Observation    Athena Masse MD [facility]     09/27/2020, 2:00 AM

## 2020-09-27 NOTE — ED Notes (Signed)
Patient transported to X-ray 

## 2020-09-27 NOTE — Progress Notes (Signed)
OT Cancellation Note  Patient Details Name: Jasmine Colon MRN: [id number] DOB: 1940-11-27   Cancelled Treatment:    Reason Eval/Treat Not Completed: Patient not medically ready. Consult received, chart reviewed. Pt noted with fall resulting in hip dislocation. Per ED MD, pt scheduled for surgical hip reduction today. Will evaluate and treat as indicated following surgery. Will require a continue at transfer to current order or new OT consult.  Hanley Hays, MPH, MS, OTR/L ascom [phone] 09/27/20, 11:22 AM

## 2020-09-27 NOTE — ED Notes (Signed)
Patient and family inquiring about patient taking home medication for Parkinson's. Per MD, pt able to take home medication at this time with small sips of water. Patient took dose of Sinemet.

## 2020-09-27 NOTE — Anesthesia Postprocedure Evaluation (Signed)
Anesthesia Post Note  Patient: Jasmine Colon  Procedure(s) Performed: CLOSED REDUCTION HIP (Left: Hip)  Patient location during evaluation: PACU Anesthesia Type: General Level of consciousness: awake and alert Pain management: pain level controlled Vital Signs Assessment: post-procedure vital signs reviewed and stable Respiratory status: spontaneous breathing, nonlabored ventilation, respiratory function stable and patient connected to nasal cannula oxygen Cardiovascular status: blood pressure returned to baseline and stable Postop Assessment: no apparent nausea or vomiting Anesthetic complications: no   No notable events documented.   Last Vitals:  Vitals:   09/27/20 1800 09/27/20 1820  BP: 111/74 125/72  Pulse: 88 87  Resp: 20   Temp: (!) 36.1 C 36.8 C  SpO2: 96% 96%    Last Pain:  Vitals:   09/27/20 1820  TempSrc: Oral  PainSc:                  Precious Haws [place]

## 2020-09-27 NOTE — ED Provider Notes (Signed)
[facility] Emergency Department Provider Note   ____________________________________________   Event Date/Time   First MD Initiated Contact with Patient 09/27/20 [phone]     (approximate)  I have reviewed the triage vital signs and the nursing notes.   HISTORY  Chief Complaint Fall and Hip Injury    HPI Jasmine Colon is a 80 y.o. female brought to the ED via EMS from [facility] status post mechanical fall.  Patient had left hip hemiarthroplasty for displaced subcapital left hip fracture on 09/21/2020.  She is rehabilitating at [facility].  Patient fell during a transfer and landed on her left hip.  Reportedly x-rays taken at the facility demonstrates left hip dislocation.  Unfortunately no films were sent with the patient.  Patient denies striking head or LOC.  Denies vision changes, neck pain, chest pain, shortness of breath, abdominal pain, nausea, vomiting or dizziness.     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 Jamal Collin. Benign   BREAST LUMPECTOMY Right 11/13/2011   invasive mammary ca, DCIS, LCIS in specimen. Clear margins.  LN negative   BREAST SURGERY Right 2013   wide excision   CESAREAN SECTION  31 years ago   COLONOSCOPY  2008   HIP ARTHROPLASTY Left 09/21/2020   Procedure: ARTHROPLASTY BIPOLAR HIP (HEMIARTHROPLASTY);  Surgeon: Earnestine Leys, MD;  Location: [facility] ORS;  Service: Orthopedics;  Laterality: Left;    Prior to Admission medications   Medication Sig Start Date End Date Taking? Authorizing Provider  aspirin EC 81 MG EC tablet Take 1 tablet (81 mg total) by mouth 2 (two) times daily. For 6 weeks 09/24/20   Fritzi Mandes, MD  Calcium Carbonate-Vitamin D (CALTRATE 600+D PO) Take 3 tablets by mouth 3 (three) times daily.     [provider]  carbidopa-levodopa (SINEMET IR) 25-100 MG tablet Take 1 tablet by mouth 6 (six) times daily. 09/24/20   Fritzi Mandes, MD  cyanocobalamin 1000 MCG tablet Take 100 mcg by mouth daily.    [provider]  docusate sodium (COLACE) 100 MG  capsule Take 1 capsule (100 mg total) by mouth 2 (two) times daily. 09/24/20   Fritzi Mandes, MD  ferrous sulfate 325 (65 FE) MG tablet Take 1 tablet (325 mg total) by mouth daily with breakfast. 09/25/20   Fritzi Mandes, MD  fluticasone [facility]) 50 MCG/ACT nasal spray Place 2 sprays into both nostrils daily as needed for allergies or rhinitis.    [provider]  HYDROcodone-acetaminophen (NORCO/VICODIN) 5-325 MG tablet Take 1-2 tablets by mouth every 6 (six) hours as needed for moderate pain (pain score 4-6). 09/24/20   Fritzi Mandes, MD   Ibuprofen 200 MG CAPS Take 400 mg by mouth every 8 (eight) hours as needed.    [provider]  loratadine (CLARITIN) 10 MG tablet Take 10 mg by mouth daily as needed for allergies.    [provider]  mirtazapine (REMERON) 30 MG tablet Take 30 mg by mouth at bedtime. 06/27/20   [provider]  Multiple Vitamins-Minerals (PRESERVISION AREDS PO) Take by mouth daily.    [provider]  rasagiline (AZILECT) 1 MG TABS tablet Take 1 tablet (1 mg total) by mouth daily. 09/25/20   Fritzi Mandes, MD  sodium chloride (OCEAN) 0.65 % SOLN nasal spray Place 1 spray into both nostrils as needed for congestion.    [provider]    Allergies Codeine and Pseudoephedrine  Family History  Problem Relation Age of Onset   Stroke Mother    Heart failure Father    Heart disease Brother        bypass   Cancer Maternal Aunt        breast    Breast cancer Maternal Aunt    Cancer Maternal Aunt        breast   Breast cancer Maternal Aunt     Social History Social History   Tobacco Use   Smoking status: Every Day    Packs/day: 0.50    Years: 45.00    Pack years: 22.50    Types: Cigarettes   Smokeless tobacco: Never  Substance Use Topics   Alcohol use: No   Drug use: No    Review of Systems  Constitutional: No fever/chills Eyes: No visual changes. ENT: No sore throat. Cardiovascular: Denies chest pain. Respiratory: Denies shortness of breath. Gastrointestinal: No abdominal pain.  No nausea, no vomiting.  No diarrhea.  No constipation. Genitourinary: Negative for dysuria. Musculoskeletal: Positive for left hip pain.  Negative for back pain. Skin: Negative for rash. Neurological: Negative for headaches, focal weakness or numbness.   ____________________________________________   PHYSICAL EXAM:  VITAL SIGNS: ED Triage Vitals  Enc Vitals Group     BP      Pulse      Resp      Temp      Temp src      SpO2      Weight      Height       Head Circumference      Peak Flow      Pain Score      Pain Loc      Pain Edu?      Excl. in [place]?     Constitutional: Alert and oriented.  Elderly appearing and in mild acute distress. Eyes: Conjunctivae are normal. PERRL. EOMI. Head: Atraumatic. Nose: Atraumatic. Mouth/Throat: Mucous membranes are moist.  No dental malocclusion. Neck: No stridor.  No cervical spine tenderness to palpation. Cardiovascular: Normal rate, regular rhythm. Grossly normal heart sounds.  Good peripheral circulation.  Respiratory: Normal respiratory effort.  No retractions. Lungs CTAB. Gastrointestinal: Soft and nontender to light or deep palpation. No distention. No abdominal bruits. No CVA tenderness. Musculoskeletal: Pelvis stable.  LLE shortened.  Deformity noted at left hip.  Limited range of motion secondary to pain.  2+ distal pulses.  Brisk, less than 5-second capillary refill.  Neurologic:  Normal speech and language. No gross focal neurologic deficits are appreciated.  Skin:  Skin is warm, dry and intact. No rash noted. Psychiatric: Mood and affect are normal. Speech and behavior are normal.  ____________________________________________   LABS (all labs ordered are listed, but only abnormal results are displayed)  Labs Reviewed  CBC WITH DIFFERENTIAL/PLATELET - Abnormal; Notable for the following components:      Result Value   RBC 2.44 (*)    Hemoglobin 7.9 (*)    HCT 23.1 (*)    nRBC 0.4 (*)    Neutro Abs 8.2 (*)    Abs Immature Granulocytes 0.30 (*)    All other components within normal limits  COMPREHENSIVE METABOLIC PANEL - Abnormal; Notable for the following components:   Glucose, Bld 135 (*)    BUN 33 (*)    Creatinine, Ser 1.03 (*)    Calcium 8.2 (*)    Albumin 2.9 (*)    AST 50 (*)    Total Bilirubin 1.6 (*)    GFR, Estimated 56 (*)    All other components within normal limits  RESP PANEL BY RT-PCR (FLU A&B, COVID) ARPGX2  PROTIME-INR  CBC  BASIC METABOLIC PANEL  TYPE AND  SCREEN  TROPONIN I (HIGH SENSITIVITY)  TROPONIN I (HIGH SENSITIVITY)   ____________________________________________  EKG  ED ECG REPORT I, Zariah Cavendish J, the attending physician, personally viewed and interpreted this ECG.   Date: 09/27/2020  EKG Time: 0137  Rate: 93  Rhythm: normal EKG, normal sinus rhythm  Axis: Normal  Intervals: Short PR interval  ST&T Change: Nonspecific  ____________________________________________  RADIOLOGY I, Anyah Swallow J, personally viewed and evaluated these images (plain radiographs) as part of my medical decision making, as well as reviewing the written report by the radiologist.  ED MD interpretation: Left hip dislocation; no acute cardiopulmonary process  Official radiology report(s): DG Chest Port 1 View  Result Date: 09/27/2020 CLINICAL DATA:  Fall, left hip dislocation EXAM: PORTABLE CHEST 1 VIEW COMPARISON:  09/21/2020 FINDINGS: Lungs are clear. No pneumothorax or pleural effusion. The thoracic aorta is tortuous appears mildly ectatic likely as result of patient positioning. Cardiac size within normal limits. Sigmoid scoliosis of the thoracolumbar spine noted. No acute bone abnormality. IMPRESSION: No active disease. Electronically Signed   By: Fidela Salisbury MD   On: 09/27/2020 02:54   DG Hip Unilat With Pelvis 2-3 Views Left  Result Date: 09/27/2020 CLINICAL DATA:  Fall, left hip deformity EXAM: DG HIP (WITH OR WITHOUT PELVIS) 2-3V LEFT COMPARISON:  None. FINDINGS: Left hip bipolar hemiarthroplasty has been performed. Since prior examination, there is now superior dislocation in marked internal rotation of the left hip. Small ossific fragments adjacent to the left acetabulum may represent the residua of recent arthroplasty procedure. No displaced fracture identified. The pelvis and visualized right hip are unremarkable. IMPRESSION: Dislocation of the left hip bipolar hemiarthroplasty. Electronically Signed   By: Fidela Salisbury MD   On: 09/27/2020  02:52    ____________________________________________   PROCEDURES  Procedure(s) performed (including Critical Care):  .1-3 Lead EKG Interpretation  Date/Time: 09/27/2020 12:53 AM Performed by: Paulette Blanch, MD Authorized by: Beather Arbour,  Gretchen Short, MD     Interpretation: normal     ECG rate:  98   ECG rate assessment: normal     Rhythm: sinus rhythm     Ectopy: none     Conduction: normal   Comments:     Patient placed on cardiac monitor to evaluate for arrhythmias   ____________________________________________   INITIAL IMPRESSION / ASSESSMENT AND PLAN / ED COURSE  As part of my medical decision making, I reviewed the following data within the [place] notes reviewed and incorporated, Labs reviewed, EKG interpreted, Old chart reviewed, Radiograph reviewed, and Notes from prior ED visits     80 year old female who presents status post left hip injury.  Differential diagnosis includes but is not limited to fracture, dislocation, musculoskeletal contusion.  Reportedly patient has a left hip dislocation by x-rays taken at rehab facility.  No films were sent.  Will obtain x-ray images, lab work, COVID swab.  Clinical Course as of 09/27/20 0257  Fri Sep 27, 2020  0127 Discussed case with Dr. Sharlet Salina from orthopedics.  Given patient's age and new anemia, will discuss with hospitalist services for admission.  Ortho will likely reduce patient in the OR in the morning. [JS]    Clinical Course User Index [JS] Paulette Blanch, MD     ____________________________________________   FINAL CLINICAL IMPRESSION(S) / ED DIAGNOSES  Final diagnoses:  Anemia, unspecified type  Dislocated hip, left, initial encounter [facility])     ED Discharge Orders     None        Note:  This document was prepared using Dragon voice recognition software and may include unintentional dictation errors.    Paulette Blanch, MD 09/27/20 [phone]

## 2020-09-27 NOTE — Op Note (Signed)
DATE OF SURGERY:  09/27/2020  TIME: 5:30 PM  PATIENT NAME:  Jasmine Colon  AGE: 80 y.o.  PRE-OPERATIVE DIAGNOSIS: Periprosthetic left hip dislocation  POST-OPERATIVE DIAGNOSIS:  SAME  PROCEDURE: Closed reduction of left periprosthetic hip dislocation under sedation  SURGEON:  Renee Harder  COMPLICATIONS: None   PREOPERATIVE INDICATIONS:  [name] is a 80 y.o. year old who underwent left hip hemiarthroplasty on 6/4 and subsequently discharged to rehab.  She had a fall sustained a periprosthetic hip dislocation and was admitted to the hospitalist service.  The risks benefits and alternatives were discussed with the patient including but not limited to the risks of nonoperative treatment, versus surgical intervention including infection, bleeding, nerve injury, malunion, nonunion, hardware prominence, hardware failure, need for hardware removal, blood clots, cardiopulmonary complications, morbidity, mortality, among others, and they were willing to proceed.    OPERATIVE PROCEDURE:  The patient was brought to the operating room and placed in the supine position.  Sedation was performed with the use of propofol and the patient was appropriately relaxed.  Inline traction was performed with the leg in adduction and internal rotation.  The knee was gently flexed and the hip was externally rotated and the hip was reduced.  Subsequent x-ray showed the presence of appropriate reduction of the hemiarthroplasty and no evidence of fracture.  Patient was awoken from sedation and taken to the PACU.  Hip abduction pillow was placed between the patient's legs.  Patient tolerated the procedure well.   POSTOPERATIVE PLAN: 1.  Patient will be partial weightbearing with a walker and will use a hip abduction brace which will need to be ordered. 2.  Posterior hip precautions for 3 months including no hip adduction or internal rotation, no hip flexion greater than 90 degrees 3. Ok to continue  ASA BID per Dr. Sabra Heck for DVT ppx. 4.  Continue with regular scheduled follow-up with Dr. Sabra Heck in 1 week.   Renee Harder

## 2020-09-27 NOTE — ED Notes (Signed)
Pharmacy messaged to send dose of IV Robaxin for muscle spasms. Requested to send medication to 2A.

## 2020-09-27 NOTE — ED Triage Notes (Addendum)
Pt to ED via EMS from [facility] c/o mechanical fall tonight.  States had left hip surgery 1 week ago, reports had xray done tonight showing left dislocation.  Denies hitting her head or LOC, takes daily baby ASA since surgery, EMS vitals 110/69 BP, 94% RA, 95 HR.  Pt A&Ox4, chest rise even and unlabored.

## 2020-09-27 NOTE — Anesthesia Procedure Notes (Signed)
Date/Time: 09/27/2020 5:19 PM Performed by: Lily Peer, Barre Aydelott, CRNA Pre-anesthesia Checklist: Patient identified, Emergency Drugs available, Suction available, Patient being monitored and Timeout performed

## 2020-09-27 NOTE — Progress Notes (Signed)
Patient sets off bed alarm attempting to exit bed. All IV access removed as well as pure wick and SCDs. Verbal redirection successful after several attempts. Patient  orientation has deviated from baseline per daughter. Patient seems more "confused and mean natured "per daughter. Patient currently back in bed eating dinner tray. Bed alarm enabled, will continue to monitor to ensure comfort and safety.

## 2020-09-27 NOTE — Anesthesia Preprocedure Evaluation (Addendum)
Anesthesia Evaluation  Patient identified by MRN, date of birth, ID band Patient confused    Reviewed: Allergy & Precautions, NPO status , Patient's Chart, lab work & pertinent test results  History of Anesthesia Complications Negative for: history of anesthetic complications  Airway Mallampati: III  TM Distance: <3 FB Neck ROM: limited    Dental  (+) Chipped   Pulmonary asthma , Current Smoker,    Pulmonary exam normal        Cardiovascular Normal cardiovascular exam     Neuro/Psych  Neuromuscular disease negative psych ROS   GI/Hepatic negative GI ROS, Neg liver ROS,   Endo/Other  negative endocrine ROS  Renal/GU Renal disease  negative genitourinary   Musculoskeletal  (+) Arthritis ,   Abdominal   Peds  Hematology negative hematology ROS (+)   Anesthesia Other Findings Past Medical History: 2012: Asthma No date: Hyperlipidemia No date: Lesion of lateral popliteal nerve No date: Lower back pain 2013: Malignant neoplasm of 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 Hormone positive, HER 2 no amplified No date: Mitral valve disorder No date: Osteoarthritis No date: Osteoporosis 2012: Parkinson's disease ([place]) 2013: Personal history of radiation therapy     Comment:  right breast ca with lumpectomy and rad tx  Past Surgical History: 10/26/2011: BREAST BIOPSY; Right     Comment:  invasive mammary carcinoma and DCIS 01/09/2014: BREAST BIOPSY; Left     Comment:  retroaerolar bx with Dr. Jamal Collin. Benign 11/13/2011: BREAST LUMPECTOMY; Right     Comment:  invasive mammary ca, DCIS, LCIS in specimen. Clear               margins.  LN negative 2013: BREAST SURGERY; Right     Comment:  wide excision 31 years ago: CESAREAN SECTION 2008: COLONOSCOPY 09/21/2020: HIP ARTHROPLASTY; Left     Comment:  Procedure: ARTHROPLASTY BIPOLAR HIP (HEMIARTHROPLASTY);               Surgeon: Earnestine Leys, MD;  Location: [facility] ORS;                Service: Orthopedics;  Laterality: Left;  BMI    Body Mass Index: 18.30 kg/m      Reproductive/Obstetrics negative OB ROS                            Anesthesia Physical Anesthesia Plan  ASA: 3  Anesthesia Plan: General   Post-op Pain Management:    Induction: Intravenous  PONV Risk Score and Plan: Propofol infusion and TIVA  Airway Management Planned: Natural Airway and Mask  Additional Equipment:   Intra-op Plan:   Post-operative Plan:   Informed Consent: I have reviewed the patients History and Physical, chart, labs and discussed the procedure including the risks, benefits and alternatives for the proposed anesthesia with the patient or authorized representative who has indicated his/her understanding and acceptance.   Patient has DNR.  Discussed DNR with patient, Discussed DNR with power of attorney and Suspend DNR.   Dental Advisory Given  Plan Discussed with: Anesthesiologist, CRNA and Surgeon  Anesthesia Plan Comments: (Patient husband and daughter consented for risks of anesthesia including but not limited to:  - adverse reactions to medications - risk of airway placement if required - damage to eyes, teeth, lips or other oral mucosa - nerve damage due to positioning  - sore throat or hoarseness - Damage to heart,  brain, nerves, lungs, other parts of body or loss of life  They voiced understanding.)        Anesthesia Quick Evaluation

## 2020-09-27 NOTE — Consult Note (Signed)
ORTHOPAEDIC CONSULTATION  REQUESTING PHYSICIAN: Edwin Dada, *  Chief Complaint: Left periprosthetic hip dislocation  HPI: Jasmine Colon is a 80 y.o. female who complains of left hip pain after mechanical fall while at rehab at [facility] yesterday evening.  She is status post left hip hemiarthroplasty with Dr. Sabra Heck on 6/4.  Patient was admitted to the hospitalist service for preoperative medical optimization.  Patient complains of pain in the left hip, no numbness or tingling.  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 Hormone positive, HER 2 no amplified   Mitral valve disorder    Osteoarthritis    Osteoporosis    Parkinson's disease ([place]) 2012   Personal history of radiation therapy 2013   right breast ca with lumpectomy and rad tx   Past Surgical History:  Procedure Laterality Date   BREAST BIOPSY Right 10/26/2011   invasive mammary carcinoma and DCIS   BREAST BIOPSY Left 01/09/2014   retroaerolar bx with Dr. Jamal Collin. Benign   BREAST LUMPECTOMY Right 11/13/2011   invasive mammary ca, DCIS, LCIS in specimen. Clear margins.  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  Tobacco Use   Smoking status: Every Day    Packs/day: 0.50     Years: 45.00    Pack years: 22.50    Types: Cigarettes   Smokeless tobacco: Never  Substance and Sexual Activity   Alcohol use: No   Drug use: No   Sexual activity: Not on file  Other Topics Concern   Not on file  Social History Narrative   Patient is right handed, resides with husband   Social Determinants of Radio broadcast assistant Strain: Not on file  Food Insecurity: Not on file  Transportation Needs: Not on file  Physical Activity: Not on file  Stress: Not on file  Social Connections: Not on file   Family History  Problem Relation Age of Onset   Stroke Mother    Heart failure Father    Heart disease Brother        bypass   Cancer Maternal Aunt        breast    Breast cancer Maternal Aunt    Cancer Maternal Aunt        breast   Breast cancer Maternal Aunt    Allergies  Allergen Reactions   Codeine     Feels weird   Pseudoephedrine     Other reaction(s): Dizziness and giddiness (finding)   Prior to Admission medications   Medication Sig Start Date End Date Taking? Authorizing Provider  aspirin EC 81 MG EC tablet Take 1 tablet (81 mg total) by mouth 2 (two) times daily.  For 6 weeks 09/24/20   Fritzi Mandes, MD  Calcium Carbonate-Vitamin D (CALTRATE 600+D PO) Take 3 tablets by mouth 3 (three) times daily.     [provider]  carbidopa-levodopa (SINEMET IR) 25-100 MG tablet Take 1 tablet by mouth 6 (six) times daily. 09/24/20   Fritzi Mandes, MD  cyanocobalamin 1000 MCG tablet Take 100 mcg by mouth daily.    [provider]  docusate sodium (COLACE) 100 MG capsule Take 1 capsule (100 mg total) by mouth 2 (two) times daily. 09/24/20   Fritzi Mandes, MD  ferrous sulfate 325 (65 FE) MG tablet Take 1 tablet (325 mg total) by mouth daily with breakfast. 09/25/20   Fritzi Mandes, MD  fluticasone [facility]) 50 MCG/ACT nasal spray Place 2 sprays into both nostrils daily as needed for allergies or rhinitis.    [provider]  HYDROcodone-acetaminophen  (NORCO/VICODIN) 5-325 MG tablet Take 1-2 tablets by mouth every 6 (six) hours as needed for moderate pain (pain score 4-6). 09/24/20   Fritzi Mandes, MD  Ibuprofen 200 MG CAPS Take 400 mg by mouth every 8 (eight) hours as needed.    [provider]  loratadine (CLARITIN) 10 MG tablet Take 10 mg by mouth daily as needed for allergies.    [provider]  mirtazapine (REMERON) 30 MG tablet Take 30 mg by mouth at bedtime. 06/27/20   [provider]  Multiple Vitamins-Minerals (PRESERVISION AREDS PO) Take by mouth daily.    [provider]  rasagiline (AZILECT) 1 MG TABS tablet Take 1 tablet (1 mg total) by mouth daily. 09/25/20   Fritzi Mandes, MD  sodium chloride (OCEAN) 0.65 % SOLN nasal spray Place 1 spray into both nostrils as needed for congestion.    [provider]   DG Chest Port 1 View  Result Date: 09/27/2020 CLINICAL DATA:  Fall, left hip dislocation EXAM: PORTABLE CHEST 1 VIEW COMPARISON:  09/21/2020 FINDINGS: Lungs are clear. No pneumothorax or pleural effusion. The thoracic aorta is tortuous appears mildly ectatic likely as result of patient positioning. Cardiac size within normal limits. Sigmoid scoliosis of the thoracolumbar spine noted. No acute bone abnormality. IMPRESSION: No active disease. Electronically Signed   By: Fidela Salisbury MD   On: 09/27/2020 02:54   DG Hip Unilat With Pelvis 2-3 Views Left  Result Date: 09/27/2020 CLINICAL DATA:  Fall, left hip deformity EXAM: DG HIP (WITH OR WITHOUT PELVIS) 2-3V LEFT COMPARISON:  None. FINDINGS: Left hip bipolar hemiarthroplasty has been performed. Since prior examination, there is now superior dislocation in marked internal rotation of the left hip. Small ossific fragments adjacent to the left acetabulum may represent the residua of recent arthroplasty procedure. No displaced fracture identified. The pelvis and visualized right hip are unremarkable. IMPRESSION: Dislocation of the left hip bipolar  hemiarthroplasty. Electronically Signed   By: Fidela Salisbury MD   On: 09/27/2020 02:52    Positive ROS: All other systems have been reviewed and were otherwise negative with the exception of those mentioned in the HPI and as above.  Physical Exam: General: Alert, no acute distress Cardiovascular: No pedal edema Respiratory: No cyanosis, no use of accessory musculature GI: No organomegaly, abdomen is soft and non-tender Skin: No lesions in the area of chief complaint Neurologic: Sensation intact distally Psychiatric: Patient is competent for consent with normal mood and affect Lymphatic: No axillary or cervical lymphadenopathy  MUSCULOSKELETAL:  Left lower extremity is shortened and internally rotated able to wiggle toes and endorses sensation throughout entire foot,  cap refill less than 2 seconds  Assessment: 80 year old female with Parkinson's dementia status post left hip hemiarthroplasty with Dr. Sabra Heck on 6-4 with periprosthetic hip dislocation after fall at rehab at [facility] on 6/9.  Plan: We reviewed treatment options with the patient and her family.  Recommendation was made for closed reduction under sedation in the OR.  We also discussed possibility of risks of this procedure including inability to reduce the hip or potentially even fracture.  We discussed that this may require open reduction if necessary.  All questions were addressed satisfactorily.  Informed consent was provided.  We will plan to place the patient in a hip abduction brace postoperatively when she will be partial weightbearing with a walker per Dr. Ammie Ferrier instructions.    Renee Harder, MD    09/27/2020 5:08 PM

## 2020-09-27 NOTE — Progress Notes (Signed)
PROGRESS NOTE    Jasmine Colon  EUM:[id number] DOB: 1940/05/22 DOA: 09/27/2020 PCP: Dion Body, MD      Brief Narrative:  Jasmine Colon is a 80 y.o. F with arthritis, Parkinson's disease, breast cancer status postlumpectomy radiotherapy, and recent left hemiarthroplasty on 6/4 who presented from rehab after a fall.  Patient was transferring when she had an accidental fall and landed on her left hip.  X-rays done at the facility revealed a left hip dislocation and she was sent to the ER.      Assessment & Plan:  Dislocated recent left hemiarthroplasty -Consult orthopedics, appreciate recommendations     Parkinson's disease -Continue Sinemet, rasagiline  Postoperative acute blood loss anemia Low and prior to surgery was 12, today down to 7.2, lower than prior to discharge - Continue iron -Transfusion threshold 7 g/dL, would recommend intraoperative transfusion  AKI ruled out Patient has some mild increase in her creatinine, likely due to anemia         Disposition: Status is: Observation  The patient will require care spanning > 2 midnights and should be moved to inpatient because:  Surgery, postoperative placement  Dispo: The patient is from: SNF              Anticipated d/c is to: SNF              Patient currently is not medically stable to d/c.   Difficult to place patient No              MDM: This is a no charge note.  For further details, please see H&P by my partner Dr. Damita Dunnings from earlier today.  The below labs and imaging reports were reviewed and summarized above.    DVT prophylaxis: SCDs Start: 09/27/20 0156  Code Status: DNR Family Communication:               Subjective: Has some hip pain, no confusion, fever, chest pain, respiratory distress        Objective: Vitals:   09/27/20 0052 09/27/20 0100 09/27/20 0247 09/27/20 0700  BP: 125/79  138/78 108/70  Pulse: 98  94 96  Resp: 20     Temp: 98.3 F  (36.8 C)  98.1 F (36.7 C) 98 F (36.7 C)  TempSrc: Oral  Oral Oral  SpO2: 95%  95% 96%  Weight:  49.9 kg    Height:  5\' 5"  (1.651 m)      Intake/Output Summary (Last 24 hours) at 09/27/2020 1436 Last data filed at 09/27/2020 0327 Gross per 24 hour  Intake 1.35 ml  Output --  Net 1.35 ml   Filed Weights   09/27/20 0100  Weight: 49.9 kg    Examination: The patient was seen and examined.      Data Reviewed: I have personally reviewed following labs and imaging studies:  CBC: Recent Labs  Lab 09/21/20 0153 09/21/20 0821 09/22/20 0630 09/27/20 0054 09/27/20 0349  WBC 14.8* 8.6 7.7 10.5 9.1  NEUTROABS  --  6.2  --  8.2*  --   HGB 10.8* 10.7* 9.7* 7.9* 7.2*  HCT 31.3* 30.4* 28.1* 23.1* 21.2*  MCV 93.2 92.4 93.7 94.7 95.1  PLT 274 258 217 346 540   Basic Metabolic Panel: Recent Labs  Lab 09/21/20 0153 09/22/20 0630 09/27/20 0054 09/27/20 0349  NA 135 135 135 136  K 4.0 3.8 4.2 4.4  CL 103 104 103 101  CO2 23 24 23 26   GLUCOSE 118* 127* 135* 128*  BUN 24* 14 33* 33*  CREATININE 0.84 0.74 1.03* 1.01*  CALCIUM 8.6* 7.9* 8.2* 8.0*   GFR: Estimated Creatinine Clearance: 36.7 mL/min (A) (by C-G formula based on SCr of 1.01 mg/dL (H)). Liver Function Tests: Recent Labs  Lab 09/21/20 0821 09/27/20 0054  AST 43* 50*  ALT 13 8  ALKPHOS 58 68  BILITOT 1.6* 1.6*  PROT 6.6 6.6  ALBUMIN 3.3* 2.9*   No results for input(s): LIPASE, AMYLASE in the last 168 hours. No results for input(s): AMMONIA in the last 168 hours. Coagulation Profile: Recent Labs  Lab 09/21/20 0153 09/27/20 0054  INR 1.1 1.1   Cardiac Enzymes: No results for input(s): CKTOTAL, CKMB, CKMBINDEX, TROPONINI in the last 168 hours. BNP (last 3 results) No results for input(s): PROBNP in the last 8760 hours. HbA1C: No results for input(s): HGBA1C in the last 72 hours. CBG: No results for input(s): GLUCAP in the last 168 hours. Lipid Profile: No results for input(s): CHOL, HDL, LDLCALC,  TRIG, CHOLHDL, LDLDIRECT in the last 72 hours. Thyroid Function Tests: No results for input(s): TSH, T4TOTAL, FREET4, T3FREE, THYROIDAB in the last 72 hours. Anemia Panel: No results for input(s): VITAMINB12, FOLATE, FERRITIN, TIBC, IRON, RETICCTPCT in the last 72 hours. Urine analysis:    Component Value Date/Time   COLORURINE YELLOW (A) 09/21/2020 0153   APPEARANCEUR CLEAR (A) 09/21/2020 0153   LABSPEC 1.021 09/21/2020 0153   PHURINE 5.0 09/21/2020 0153   GLUCOSEU NEGATIVE 09/21/2020 0153   HGBUR NEGATIVE 09/21/2020 0153   BILIRUBINUR NEGATIVE 09/21/2020 0153   KETONESUR 5 (A) 09/21/2020 0153   PROTEINUR NEGATIVE 09/21/2020 0153   NITRITE NEGATIVE 09/21/2020 0153   LEUKOCYTESUR SMALL (A) 09/21/2020 0153   Sepsis Labs: @LABRCNTIP (procalcitonin:4,lacticacidven:4)  ) Recent Results (from the past 240 hour(s))  Resp Panel by RT-PCR (Flu A&B, Covid) Nasopharyngeal Swab     Status: None   Collection Time: 09/21/20  1:53 AM   Specimen: Nasopharyngeal Swab; Nasopharyngeal(NP) swabs in vial transport medium  Result Value Ref Range Status   SARS Coronavirus 2 by RT PCR NEGATIVE NEGATIVE Final    Comment: (NOTE) SARS-CoV-2 target nucleic acids are NOT DETECTED.  The SARS-CoV-2 RNA is generally detectable in upper respiratory specimens during the acute phase of infection. The lowest concentration of SARS-CoV-2 viral copies this assay can detect is 138 copies/mL. A negative result does not preclude SARS-Cov-2 infection and should not be used as the sole basis for treatment or other patient management decisions. A negative result may occur with  improper specimen collection/handling, submission of specimen other than nasopharyngeal swab, presence of viral mutation(s) within the areas targeted by this assay, and inadequate number of viral copies(<138 copies/mL). A negative result must be combined with clinical observations, patient history, and epidemiological information. The expected  result is Negative.  Fact Sheet for Patients:  EntrepreneurPulse.com.au  Fact Sheet for Healthcare Providers:  IncredibleEmployment.be  This test is no t yet approved or cleared by the [place] FDA and  has been authorized for detection and/or diagnosis of SARS-CoV-2 by FDA under an Emergency Use Authorization (EUA). This EUA will remain  in effect (meaning this test can be used) for the duration of the COVID-19 declaration under Section 564(b)(1) of the Act, 21 U.S.C.section 360bbb-3(b)(1), unless the authorization is terminated  or revoked sooner.       Influenza A by PCR NEGATIVE NEGATIVE Final   Influenza B by PCR NEGATIVE NEGATIVE Final    Comment: (NOTE) The Xpert Xpress SARS-CoV-2/FLU/RSV plus assay is  intended as an aid in the diagnosis of influenza from Nasopharyngeal swab specimens and should not be used as a sole basis for treatment. Nasal washings and aspirates are unacceptable for Xpert Xpress SARS-CoV-2/FLU/RSV testing.  Fact Sheet for Patients: EntrepreneurPulse.com.au  Fact Sheet for Healthcare Providers: IncredibleEmployment.be  This test is not yet approved or cleared by the [place] FDA and has been authorized for detection and/or diagnosis of SARS-CoV-2 by FDA under an Emergency Use Authorization (EUA). This EUA will remain in effect (meaning this test can be used) for the duration of the COVID-19 declaration under Section 564(b)(1) of the Act, 21 U.S.C. section 360bbb-3(b)(1), unless the authorization is terminated or revoked.  Performed at [facility], [address], [place], [place] [address]   Surgical PCR screen     Status: None   Collection Time: 09/21/20  7:20 AM   Specimen: Nasal Mucosa; Nasal Swab  Result Value Ref Range Status   MRSA, PCR NEGATIVE NEGATIVE Final   Staphylococcus aureus NEGATIVE NEGATIVE Final    Comment: (NOTE) The Xpert SA Assay  (FDA approved for NASAL specimens in patients 88 years of age and older), is one component of a comprehensive surveillance program. It is not intended to diagnose infection nor to guide or monitor treatment. Performed at [facility], [place], [place] [address]   Resp Panel by RT-PCR (Flu A&B, Covid) Nasopharyngeal Swab     Status: None   Collection Time: 09/23/20 11:43 AM   Specimen: Nasopharyngeal Swab; Nasopharyngeal(NP) swabs in vial transport medium  Result Value Ref Range Status   SARS Coronavirus 2 by RT PCR NEGATIVE NEGATIVE Final    Comment: (NOTE) SARS-CoV-2 target nucleic acids are NOT DETECTED.  The SARS-CoV-2 RNA is generally detectable in upper respiratory specimens during the acute phase of infection. The lowest concentration of SARS-CoV-2 viral copies this assay can detect is 138 copies/mL. A negative result does not preclude SARS-Cov-2 infection and should not be used as the sole basis for treatment or other patient management decisions. A negative result may occur with  improper specimen collection/handling, submission of specimen other than nasopharyngeal swab, presence of viral mutation(s) within the areas targeted by this assay, and inadequate number of viral copies(<138 copies/mL). A negative result must be combined with clinical observations, patient history, and epidemiological information. The expected result is Negative.  Fact Sheet for Patients:  EntrepreneurPulse.com.au  Fact Sheet for Healthcare Providers:  IncredibleEmployment.be  This test is no t yet approved or cleared by the [place] FDA and  has been authorized for detection and/or diagnosis of SARS-CoV-2 by FDA under an Emergency Use Authorization (EUA). This EUA will remain  in effect (meaning this test can be used) for the duration of the COVID-19 declaration under Section 564(b)(1) of the Act, 21 U.S.C.section  360bbb-3(b)(1), unless the authorization is terminated  or revoked sooner.       Influenza A by PCR NEGATIVE NEGATIVE Final   Influenza B by PCR NEGATIVE NEGATIVE Final    Comment: (NOTE) The Xpert Xpress SARS-CoV-2/FLU/RSV plus assay is intended as an aid in the diagnosis of influenza from Nasopharyngeal swab specimens and should not be used as a sole basis for treatment. Nasal washings and aspirates are unacceptable for Xpert Xpress SARS-CoV-2/FLU/RSV testing.  Fact Sheet for Patients: EntrepreneurPulse.com.au  Fact Sheet for Healthcare Providers: IncredibleEmployment.be  This test is not yet approved or cleared by the [place] FDA and has been authorized for detection and/or diagnosis of SARS-CoV-2 by FDA  under an Emergency Use Authorization (EUA). This EUA will remain in effect (meaning this test can be used) for the duration of the COVID-19 declaration under Section 564(b)(1) of the Act, 21 U.S.C. section 360bbb-3(b)(1), unless the authorization is terminated or revoked.  Performed at [facility], [address] [place], [place] [address]   Resp Panel by RT-PCR (Flu A&B, Covid) Nasopharyngeal Swab     Status: None   Collection Time: 09/27/20 12:54 AM   Specimen: Nasopharyngeal Swab; Nasopharyngeal(NP) swabs in vial transport medium  Result Value Ref Range Status   SARS Coronavirus 2 by RT PCR NEGATIVE NEGATIVE Final    Comment: (NOTE) SARS-CoV-2 target nucleic acids are NOT DETECTED.  The SARS-CoV-2 RNA is generally detectable in upper respiratory specimens during the acute phase of infection. The lowest concentration of SARS-CoV-2 viral copies this assay can detect is 138 copies/mL. A negative result does not preclude SARS-Cov-2 infection and should not be used as the sole basis for treatment or other patient management decisions. A negative result may occur with  improper specimen collection/handling, submission  of specimen other than nasopharyngeal swab, presence of viral mutation(s) within the areas targeted by this assay, and inadequate number of viral copies(<138 copies/mL). A negative result must be combined with clinical observations, patient history, and epidemiological information. The expected result is Negative.  Fact Sheet for Patients:  EntrepreneurPulse.com.au  Fact Sheet for Healthcare Providers:  IncredibleEmployment.be  This test is no t yet approved or cleared by the [place] FDA and  has been authorized for detection and/or diagnosis of SARS-CoV-2 by FDA under an Emergency Use Authorization (EUA). This EUA will remain  in effect (meaning this test can be used) for the duration of the COVID-19 declaration under Section 564(b)(1) of the Act, 21 U.S.C.section 360bbb-3(b)(1), unless the authorization is terminated  or revoked sooner.       Influenza A by PCR NEGATIVE NEGATIVE Final   Influenza B by PCR NEGATIVE NEGATIVE Final    Comment: (NOTE) The Xpert Xpress SARS-CoV-2/FLU/RSV plus assay is intended as an aid in the diagnosis of influenza from Nasopharyngeal swab specimens and should not be used as a sole basis for treatment. Nasal washings and aspirates are unacceptable for Xpert Xpress SARS-CoV-2/FLU/RSV testing.  Fact Sheet for Patients: EntrepreneurPulse.com.au  Fact Sheet for Healthcare Providers: IncredibleEmployment.be  This test is not yet approved or cleared by the [place] FDA and has been authorized for detection and/or diagnosis of SARS-CoV-2 by FDA under an Emergency Use Authorization (EUA). This EUA will remain in effect (meaning this test can be used) for the duration of the COVID-19 declaration under Section 564(b)(1) of the Act, 21 U.S.C. section 360bbb-3(b)(1), unless the authorization is terminated or revoked.  Performed at [facility], [address], [place], [place] [address]   Surgical PCR screen     Status: None   Collection Time: 09/27/20  5:44 AM   Specimen: Nasal Mucosa; Nasal Swab  Result Value Ref Range Status   MRSA, PCR NEGATIVE NEGATIVE Final   Staphylococcus aureus NEGATIVE NEGATIVE Final    Comment: (NOTE) The Xpert SA Assay (FDA approved for NASAL specimens in patients 37 years of age and older), is one component of a comprehensive surveillance program. It is not intended to diagnose infection nor to guide or monitor treatment. Performed at [facility], [address] [place],  [address]          Radiology Studies: [facility] 1 View  Result Date:  09/27/2020 CLINICAL DATA:  Fall, left hip dislocation EXAM: PORTABLE CHEST 1 VIEW COMPARISON:  09/21/2020 FINDINGS: Lungs are clear. No pneumothorax or pleural effusion. The thoracic aorta is tortuous appears mildly ectatic likely as result of patient positioning. Cardiac size within normal limits. Sigmoid scoliosis of the thoracolumbar spine noted. No acute bone abnormality. IMPRESSION: No active disease. Electronically Signed   By: Fidela Salisbury MD   On: 09/27/2020 02:54   DG Hip Unilat With Pelvis 2-3 Views Left  Result Date: 09/27/2020 CLINICAL DATA:  Fall, left hip deformity EXAM: DG HIP (WITH OR WITHOUT PELVIS) 2-3V LEFT COMPARISON:  None. FINDINGS: Left hip bipolar hemiarthroplasty has been performed. Since prior examination, there is now superior dislocation in marked internal rotation of the left hip. Small ossific fragments adjacent to the left acetabulum may represent the residua of recent arthroplasty procedure. No displaced fracture identified. The pelvis and visualized right hip are unremarkable. IMPRESSION: Dislocation of the left hip bipolar hemiarthroplasty. Electronically Signed   By: Fidela Salisbury MD   On: 09/27/2020 02:52        Scheduled Meds:  aspirin EC  81 mg Oral BID   carbidopa-levodopa  1 tablet Oral 6 X Daily    docusate sodium  100 mg Oral BID   feeding supplement  237 mL Oral TID BM   ferrous sulfate  325 mg Oral Q breakfast   [START ON 09/28/2020] multivitamin with minerals  1 tablet Oral Daily   mupirocin ointment  1 application Nasal BID   rasagiline  1 mg Oral Daily   Continuous Infusions:  sodium chloride 100 mL/hr at 09/27/20 1426   methocarbamol (ROBAXIN) IV Stopped (09/27/20 0357)     LOS: 0 days    Time spent: 20 minutes    Edwin Dada, MD [facility] 09/27/2020, 2:36 PM     Please page though [place] or Epic secure chat:  For password, contact charge nurse

## 2020-09-27 NOTE — Transfer of Care (Signed)
Immediate Anesthesia Transfer of Care Note  Patient: Jasmine Colon  Procedure(s) Performed: CLOSED REDUCTION HIP (Left: Hip)  Patient Location: PACU  Anesthesia Type:General  Level of Consciousness: awake, alert  and oriented  Airway & Oxygen Therapy: Patient Spontanous Breathing  Post-op Assessment: Report given to RN and Post -op Vital signs reviewed and stable  Post vital signs: Reviewed and stable  Last Vitals:  Vitals Value Taken Time  BP 109/64 09/27/20 1735  Temp    Pulse 99 09/27/20 1736  Resp 19 09/27/20 1736  SpO2 97 % 09/27/20 1736  Vitals shown include unvalidated device data.  Last Pain:  Vitals:   09/27/20 1642  TempSrc: Oral  PainSc: 3          Complications: No notable events documented.

## 2020-09-27 NOTE — TOC Initial Note (Signed)
Transition of Care (TOC) - Initial/Assessment Note    Patient Details  Name: Jasmine Colon MRN: [id number] Date of Birth: 01/22/1941  Transition of Care (TOC) CM/SW Contact:    Sarah C Boswell, LCSW Phone Number: 09/27/2020, 12:28 PM  Clinical Narrative:   CSW met with patient. Husband at bedside. CSW introduced role and explained that discharge planning would be discussed. Patient confirmed she was admitted from [facility]. She discharged there on 6/7 for rehab. Admissions coordinator has confirmed they can accept her back when stable. Plan for OR today. Per MD, plan for discharge Monday if stable. Will have to have PT/OT evaluations and insurance authorization. No further concerns. CSW encouraged patient and her husband to contact CSW as needed. CSW will continue to follow patient and her husband for support and facilitate return to SNF once medically stable for discharge.              Expected Discharge Plan: Skilled Nursing Facility Barriers to Discharge: Continued Medical Work up   Patient Goals and CMS Choice     Choice offered to / list presented to : Patient, Spouse  Expected Discharge Plan and Services Expected Discharge Plan: Skilled Nursing Facility     Post Acute Care Choice: Resumption of Svcs/PTA Provider Living arrangements for the past 2 months: Single Family Home                                      Prior Living Arrangements/Services Living arrangements for the past 2 months: Single Family Home Lives with:: Spouse Patient language and need for interpreter reviewed:: Yes Do you feel safe going back to the place where you live?: Yes      Need for Family Participation in Patient Care: Yes (Comment) Care giver support system in place?: Yes (comment)   Criminal Activity/Legal Involvement Pertinent to Current Situation/Hospitalization: No - Comment as needed  Activities of Daily Living Home Assistive Devices/Equipment: Walker (specify type), Cane  (specify quad or straight) ADL Screening (condition at time of admission) Patient's cognitive ability adequate to safely complete daily activities?: Yes Is the patient deaf or have difficulty hearing?: No Does the patient have difficulty seeing, even when wearing glasses/contacts?: No Does the patient have difficulty concentrating, remembering, or making decisions?: No Patient able to express need for assistance with ADLs?: Yes Does the patient have difficulty dressing or bathing?: Yes Independently performs ADLs?: Yes (appropriate for developmental age) Does the patient have difficulty walking or climbing stairs?: Yes Weakness of Legs: Left Weakness of Arms/Hands: None  Permission Sought/Granted Permission sought to share information with : Facility Contact Representative, Family Supports Permission granted to share information with : Yes, Verbal Permission Granted  Share Information with NAME: Ronald Eldred  Permission granted to share info w AGENCY: [facility]  Permission granted to share info w Relationship: Husband  Permission granted to share info w Contact Information: [phone]  Emotional Assessment Appearance:: Appears stated age Attitude/Demeanor/Rapport: Engaged, Gracious Affect (typically observed): Accepting, Appropriate, Calm, Pleasant Orientation: : Oriented to Self, Oriented to Place, Oriented to  Time, Oriented to Situation Alcohol / Substance Use: Not Applicable Psych Involvement: No (comment)  Admission diagnosis:  Dislocation of hip joint prosthesis, initial encounter (HCC) [T84.029A, Z96.649] Dislocated hip, left, initial encounter (HCC) [S73.005A] Anemia, unspecified type [D64.9] Patient Active Problem List   Diagnosis Date Noted   Dislocation of hip prosthesis, initial encounter (HCC) 09/27/2020     Dislocation of hip joint prosthesis, initial encounter ([place]) 09/27/2020   Postoperative anemia 09/27/2020   AKI (acute kidney injury) ([place]) 09/27/2020    Accidental fall    Hyperlipidemia    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: [phone]: [phone]    Social Determinants of Health (SDOH) Interventions    Readmission Risk Interventions No flowsheet data found.

## 2020-09-27 NOTE — Progress Notes (Signed)
Initial Nutrition Assessment  DOCUMENTATION CODES:  Non-severe (moderate) malnutrition in context of social or environmental circumstances, Underweight  INTERVENTION:  Advance pt to regular diet after surgery Post-surgery - Ensure Enlive po TID, each supplement provides 350 kcal and 20 grams of protein Multivitamin with minerals daily  NUTRITION DIAGNOSIS:  Moderate Malnutrition (in the context of social/environmental circumstances) related to poor appetite as evidenced by mild fat depletion, mild muscle depletion, moderate fat depletion, moderate muscle depletion.  GOAL:  Patient will meet greater than or equal to 90% of their needs  MONITOR:  PO intake, Skin, Supplement acceptance, Diet advancement, Labs, Weight trends  REASON FOR ASSESSMENT:  Consult Hip fracture protocol  ASSESSMENT:  Pt presented to ED via EMS from after a fall to her left hip. Discharged <1 week ago after a fall and left hip surgery. At facility for rehab. XR at rehab done 6/10 showing left hip dislocation. PMH relevant for HLD, osteoporosis, parkinson's dx, hx breat cancer s/p lumpectomy and radiation.  Pt resting in bed at the time of visit, husband at bedside. Pt currently NPO for surgery later this afternoon. Discussed intake PTA. Pt reports she never eats very well. 2 meals a day and drinks pepsi mostly. Pt and husband report that she is a picky eater and does not eat a variety of foods. Does like ensure and drinks 2x/d. Agreeable to 3x once diet is advanced, prefers vanilla.  Thin with fat and muscle wasting present on exam, malnutrition likely related to poor eating habits versus an acute process. Underweight at baseline, but will request new measured weight as current is stated. Pt would benefit from a MVI daily due to picky eating.  Nutritionally Relevant Medications: Scheduled Meds:  docusate sodium  100 mg Oral BID   ferrous sulfate  325 mg Oral Q breakfast   Continuous Infusions:  sodium chloride  100 mL/hr at 09/27/20 0326   PRN Meds: senna-docusate  Labs Reviewed: BUN 33, Creatinine 1.01  NUTRITION - FOCUSED PHYSICAL EXAM: Flowsheet Row Most Recent Value  Orbital Region Moderate depletion  Upper Arm Region Mild depletion  Thoracic and Lumbar Region Mild depletion  Buccal Region Moderate depletion  Temple Region Moderate depletion  Clavicle Bone Region Mild depletion  Clavicle and Acromion Bone Region Moderate depletion  Scapular Bone Region Mild depletion  Dorsal Hand Mild depletion  Patellar Region No depletion  Anterior Thigh Region No depletion  Posterior Calf Region No depletion  Edema (RD Assessment) None  Hair Reviewed  Eyes Reviewed  Mouth Reviewed  Skin Reviewed  Nails Reviewed   Diet Order:   Diet Order             Diet NPO time specified  Diet effective now                  EDUCATION NEEDS:  No education needs have been identified at this time  Skin:  Skin Assessment: Skin Integrity Issues: Skin Integrity Issues:: Incisions Incisions: left hip  Last BM:  6/9 per RN documentation  Height:  Ht Readings from Last 1 Encounters:  09/27/20 5\' 5"  (1.651 m)    Weight:  Wt Readings from Last 1 Encounters:  09/27/20 49.9 kg    Ideal Body Weight:  56.8 kg  BMI:  Body mass index is 18.3 kg/m.  Estimated Nutritional Needs:   Kcal:  1500-1700 kcal/d  Protein:  75-90 g/d  Fluid:  >1500 mL/d  Ranell Patrick, RD, LDN Clinical Dietitian Pager on [place]

## 2020-09-28 ENCOUNTER — Encounter: Payer: Self-pay | Admitting: Orthopaedic Surgery

## 2020-09-28 DIAGNOSIS — D649 Anemia, unspecified: Secondary | ICD-10-CM

## 2020-09-28 DIAGNOSIS — Z888 Allergy status to other drugs, medicaments and biological substances status: Secondary | ICD-10-CM | POA: Diagnosis not present

## 2020-09-28 DIAGNOSIS — M810 Age-related osteoporosis without current pathological fracture: Secondary | ICD-10-CM | POA: Diagnosis present

## 2020-09-28 DIAGNOSIS — Z885 Allergy status to narcotic agent status: Secondary | ICD-10-CM | POA: Diagnosis not present

## 2020-09-28 DIAGNOSIS — G9341 Metabolic encephalopathy: Secondary | ICD-10-CM | POA: Diagnosis not present

## 2020-09-28 DIAGNOSIS — F05 Delirium due to known physiological condition: Secondary | ICD-10-CM | POA: Diagnosis not present

## 2020-09-28 DIAGNOSIS — T84021A Dislocation of internal left hip prosthesis, initial encounter: Secondary | ICD-10-CM | POA: Diagnosis present

## 2020-09-28 DIAGNOSIS — M1990 Unspecified osteoarthritis, unspecified site: Secondary | ICD-10-CM | POA: Diagnosis present

## 2020-09-28 DIAGNOSIS — Z923 Personal history of irradiation: Secondary | ICD-10-CM | POA: Diagnosis not present

## 2020-09-28 DIAGNOSIS — D62 Acute posthemorrhagic anemia: Secondary | ICD-10-CM | POA: Diagnosis not present

## 2020-09-28 DIAGNOSIS — Z66 Do not resuscitate: Secondary | ICD-10-CM | POA: Diagnosis present

## 2020-09-28 DIAGNOSIS — G20 Parkinson's disease: Secondary | ICD-10-CM | POA: Diagnosis present

## 2020-09-28 DIAGNOSIS — T84029A Dislocation of unspecified internal joint prosthesis, initial encounter: Secondary | ICD-10-CM | POA: Diagnosis present

## 2020-09-28 DIAGNOSIS — Z803 Family history of malignant neoplasm of breast: Secondary | ICD-10-CM | POA: Diagnosis not present

## 2020-09-28 DIAGNOSIS — J45909 Unspecified asthma, uncomplicated: Secondary | ICD-10-CM | POA: Diagnosis present

## 2020-09-28 DIAGNOSIS — Y792 Prosthetic and other implants, materials and accessory orthopedic devices associated with adverse incidents: Secondary | ICD-10-CM | POA: Diagnosis present

## 2020-09-28 DIAGNOSIS — F17210 Nicotine dependence, cigarettes, uncomplicated: Secondary | ICD-10-CM | POA: Diagnosis present

## 2020-09-28 DIAGNOSIS — Z20822 Contact with and (suspected) exposure to covid-19: Secondary | ICD-10-CM | POA: Diagnosis present

## 2020-09-28 DIAGNOSIS — Z681 Body mass index (BMI) 19 or less, adult: Secondary | ICD-10-CM | POA: Diagnosis not present

## 2020-09-28 DIAGNOSIS — Z853 Personal history of malignant neoplasm of breast: Secondary | ICD-10-CM | POA: Diagnosis not present

## 2020-09-28 DIAGNOSIS — S73006A Unspecified dislocation of unspecified hip, initial encounter: Secondary | ICD-10-CM | POA: Diagnosis present

## 2020-09-28 DIAGNOSIS — Z79899 Other long term (current) drug therapy: Secondary | ICD-10-CM | POA: Diagnosis not present

## 2020-09-28 DIAGNOSIS — Z7982 Long term (current) use of aspirin: Secondary | ICD-10-CM | POA: Diagnosis not present

## 2020-09-28 DIAGNOSIS — W1830XA Fall on same level, unspecified, initial encounter: Secondary | ICD-10-CM | POA: Diagnosis present

## 2020-09-28 DIAGNOSIS — E440 Moderate protein-calorie malnutrition: Secondary | ICD-10-CM | POA: Diagnosis present

## 2020-09-28 DIAGNOSIS — E785 Hyperlipidemia, unspecified: Secondary | ICD-10-CM | POA: Diagnosis present

## 2020-09-28 DIAGNOSIS — N179 Acute kidney failure, unspecified: Secondary | ICD-10-CM | POA: Diagnosis not present

## 2020-09-28 DIAGNOSIS — Z96649 Presence of unspecified artificial hip joint: Secondary | ICD-10-CM | POA: Diagnosis not present

## 2020-09-28 LAB — HEMOGLOBIN AND HEMATOCRIT, BLOOD
HCT: 22.2 % — ABNORMAL LOW (ref 36.0–46.0)
Hemoglobin: 7.7 g/dL — ABNORMAL LOW (ref 12.0–15.0)

## 2020-09-28 LAB — CBC
HCT: 20.6 % — ABNORMAL LOW (ref 36.0–46.0)
Hemoglobin: 6.9 g/dL — ABNORMAL LOW (ref 12.0–15.0)
MCH: 31.9 pg (ref 26.0–34.0)
MCHC: 33.5 g/dL (ref 30.0–36.0)
MCV: 95.4 fL (ref 80.0–100.0)
Platelets: 376 10*3/uL (ref 150–400)
RBC: 2.16 MIL/uL — ABNORMAL LOW (ref 3.87–5.11)
RDW: 14.9 % (ref 11.5–15.5)
WBC: 9.5 10*3/uL (ref 4.0–10.5)
nRBC: 0.6 % — ABNORMAL HIGH (ref 0.0–0.2)

## 2020-09-28 LAB — BASIC METABOLIC PANEL
Anion gap: 5 (ref 5–15)
BUN: 20 mg/dL (ref 8–23)
CO2: 26 mmol/L (ref 22–32)
Calcium: 7.9 mg/dL — ABNORMAL LOW (ref 8.9–10.3)
Chloride: 106 mmol/L (ref 98–111)
Creatinine, Ser: 0.76 mg/dL (ref 0.44–1.00)
GFR, Estimated: >60 mL/min (ref >60)
Glucose, Bld: 105 mg/dL — ABNORMAL HIGH (ref 70–99)
Potassium: 4.3 mmol/L (ref 3.5–5.1)
Sodium: 137 mmol/L (ref 135–145)

## 2020-09-28 LAB — PREPARE RBC (CROSSMATCH)

## 2020-09-28 MED ORDER — SODIUM CHLORIDE 0.9% IV SOLUTION: Freq: Once | INTRAVENOUS | Status: DC

## 2020-09-28 MED ORDER — OXYCODONE HCL 5 MG PO TABS: 5.0000 mg | ORAL_TABLET | ORAL | Status: DC | PRN

## 2020-09-28 MED ORDER — ACETAMINOPHEN 500 MG PO TABS
1000.0000 mg | ORAL_TABLET | Freq: Three times a day (TID) | ORAL | Status: DC
  Administered 2020-09-28 – 2020-09-29 (×4): 1000 mg via ORAL
  Filled 2020-09-28 (×5): qty 2

## 2020-09-28 NOTE — Progress Notes (Signed)
PT Cancellation Note  Patient Details Name: Jasmine Colon MRN: [id number] DOB: [birth year]   Cancelled Treatment:    Reason Eval/Treat Not Completed: Medical issues which prohibited therapy Pt with HGB 6.9, to have transfusion.  Also waiting on orders/arrival of hip ABd brace.  Pt to be PWBing on L with initiation of care.  Will maintain on caseload and continue to follow as appropriate.   Kreg Shropshire, DPT 09/28/2020, 9:47 AM

## 2020-09-28 NOTE — TOC Progression Note (Signed)
Transition of Care [facility]) - Progression Note    Patient Details  Name: Shabana Armentrout MRN: [id number] Date of Birth: May 06, 1940  Transition of Care [facility]) CM/SW [place], LCSW Phone Number: 09/28/2020, 12:18 PM  Clinical Narrative:   Per MD, likely discharge tomorrow. [facility] admissions coordinator confirmed they would be able to accept her if we get insurance authorization. Will start once therapy evaluations are in.  Expected Discharge Plan: [place] Barriers to Discharge: Continued Medical Work up  Expected Discharge Plan and Services Expected Discharge Plan: [place] Acute Care Choice: Resumption of Svcs/PTA Provider Living arrangements for the past 2 months: Single Family Home                                       Social Determinants of Health (SDOH) Interventions    Readmission Risk Interventions No flowsheet data found.

## 2020-09-28 NOTE — Progress Notes (Signed)
OT Cancellation Note  Patient Details Name: Jasmine Colon MRN: [id number] DOB: 1940-12-05   Cancelled Treatment:    Reason Eval/Treat Not Completed: Medical issues which prohibited therapy. Chart reviewed - pt noted to have Hgb and Hct critically low at 6.9, 20.6; contraindicated for exertional activity at this time. Plan for blood transfusion this AM, will plan to follow up as able.   Of note, per ortho note pt required to wear hip abduction brace, no brace seen in room, RN notified.   Dessie Coma, M.S. OTR/L  09/28/20, 9:08 AM  ascom [phone]

## 2020-09-28 NOTE — Evaluation (Signed)
Physical Therapy Evaluation Patient Details Name: Jasmine Colon MRN: [id number] DOB: 11/29/1940 Today's Date: 09/28/2020   History of Present Illness  Pt admitted last week  for L femoral neck fracture s/p hip hemiarthroplasty.  She went to rehab and fell with a dislocation, closed reduction 6/10.  History of Parkinsons disease (takes meds 6xs/day), asthma, and HLD.  Clinical Impression  Pt eager to work with PT but struggled with essentially all aspects of PT session.  (Cleared by ortho to do some limited ambulation w/o brace while in the hopsital) She has significant Parkinsionian tremoring and L hip consistently getting to/surpassing hip precaution positioning.  (Constant pigeon toeing and crossing of LEs) despite repeated cuing to insure he tries to control this as much as possible and the very real possibility that she could continue to dislocate her hip in the future.  Brace ordered and vendor to bring it this date.  Pt struggled with ambulation and while she inherently maintained PWBing due to toe-touch self selected tolerance on the L the tremoring was present in this setting and IR continued to be an issue.  Positioned with pillows to prevent hip ABd/IR/flexion however tremors remain strong and consistent.  Pt will clearly need rehab to work back to a functional level.    Follow Up Recommendations SNF    Equipment Recommendations  Rolling walker with 5" wheels (hip ABd brace ordered)    Recommendations for Other Services       Precautions / Restrictions Precautions Precautions: Fall;Posterior Hip Restrictions LLE Weight Bearing: Partial weight bearing      Mobility  Bed Mobility Overal bed mobility: Needs Assistance Bed Mobility: Supine to Sit     Supine to sit: Max assist     General bed mobility comments: Pt showed some effort in getting toward EOB but could not bridge, move L LE activity and tremoring caused consistent compromise of hip precautions - PT heavily  assisted with getting to EOB safely and within hip precuation peramters    Transfers Overall transfer level: Needs assistance Equipment used: Rolling walker (2 wheeled) Transfers: Sit to/from Stand Sit to Stand: Min assist         General transfer comment: Pt showed good awareness of UE placement, needed assist to initiate and maintain upward movement.  Very little tolerance to any WBing on the L (instructed on PWB before and during standing) with self selected toe-touch only posture.  Pt with jerking tremors t/o the standing effort and though she did not have any LOBs she was very unsteady and needed constant guarding and cuing.  Ambulation/Gait Ambulation/Gait assistance: Min assist;Mod assist Gait Distance (Feet): 10 Feet Assistive device: Rolling walker (2 wheeled)       General Gait Details: Pt very unsteady and limited with ~10 ft of ambulation.  She showed good effort but was highly reliant on the walker, showed poor ability to take more than a few pounds of pressure on the L and her tremoring made toeing in a real problem.  Pt was not safe with the effort and also fatigued very quickly.  Stairs            Wheelchair Mobility    Modified Rankin (Stroke Patients Only)       Balance Overall balance assessment: Needs assistance Sitting-balance support: Feet supported;Bilateral upper extremity supported Sitting balance-Leahy Scale: Fair Sitting balance - Comments: while sitting at EOB pt had constant tremoring with repeated toe in and leg crossing multiple times just sitting there.  Cuing and  tactile assist trying to prevent this pt with seeming good awareness but complete inability to control offending movements (similar issues while laying in bed)     Standing balance-Leahy Scale: Poor Standing balance comment: Pt with heavy UE use on RW, tremoring t/o the standing effort with poor contol and ability to make appropriate adjustments.                              Pertinent Vitals/Pain Pain Assessment: 0-10 Pain Score: 4  Pain Location: reports more pain in L thigh/knee than L hip    Home Living Family/patient expects to be discharged to:: [place]: Walker - 2 wheels      Prior Function Level of Independence: Independent         Comments: Pt reports prior to fall/fx last week she was essentially independent, driving, etc     Hand Dominance        Extremity/Trunk Assessment   Upper Extremity Assessment Upper Extremity Assessment: Generalized weakness    Lower Extremity Assessment Lower Extremity Assessment: Generalized weakness (R LE grossly functional, L with very limited AROM.  + for severe tremoring in b/l LEs t/o the session with limited funcitonal control)       Communication   Communication: No difficulties  Cognition Arousal/Alertness: Awake/alert Behavior During Therapy: WFL for tasks assessed/performed;Restless Overall Cognitive Status: Within Functional Limits for tasks assessed                                        General Comments General comments (skin integrity, edema, etc.): Pt's Parkinsonian tremors make maintaining hip precautions very problematic.    Exercises     Assessment/Plan    PT Assessment Patient needs continued PT services  PT Problem List Decreased strength;Decreased activity tolerance;Decreased mobility;Decreased coordination;Pain;Decreased knowledge of precautions;Decreased safety awareness;Decreased knowledge of use of DME;Decreased balance;Decreased range of motion       PT Treatment Interventions DME instruction;Gait training;Therapeutic exercise;Balance training    PT Goals (Current goals can be found in the Care Plan section)  Acute Rehab PT Goals Patient Stated Goal: get back to PLOF PT Goal Formulation: With patient Time For Goal Achievement: 10/12/20 Potential to Achieve Goals: Good    Frequency Min  2X/week   Barriers to discharge        Co-evaluation               AM-PAC PT "6 Clicks" Mobility  Outcome Measure Help needed turning from your back to your side while in a flat bed without using bedrails?: A Lot Help needed moving from lying on your back to sitting on the side of a flat bed without using bedrails?: A Lot Help needed moving to and from a bed to a chair (including a wheelchair)?: A Lot Help needed standing up from a chair using your arms (e.g., wheelchair or bedside chair)?: A Little Help needed to walk in hospital room?: A Lot Help needed climbing 3-5 steps with a railing? : Total 6 Click Score: 12    End of Session Equipment Utilized During Treatment: Gait belt Activity Tolerance: Patient limited by pain Patient left: in chair;with chair alarm set   PT Visit Diagnosis: Muscle weakness (generalized) (M62.81);History of falling (Z91.81);Difficulty in walking, not elsewhere classified (  R26.2);Pain Pain - Right/Left: Left Pain - part of body: Hip    Time: 9012-2241 PT Time Calculation (min) (ACUTE ONLY): 32 min   Charges:   PT Evaluation $PT Eval Low Complexity: 1 Low PT Treatments $Therapeutic Activity: 8-22 mins        Kreg Shropshire, DPT 09/28/2020, 4:09 PM

## 2020-09-28 NOTE — Progress Notes (Signed)
RN called [employer] to order pt's brace. Spoke with Alanda, gave her pt's height/weight and ordering dr. Osa Craver to call RN with delivery information.Abduction brace for left ordered.

## 2020-09-28 NOTE — Progress Notes (Addendum)
PROGRESS NOTE    Jasmine Colon  YCX:[id number] DOB: 10/27/1940 DOA: 09/27/2020 PCP: Dion Body, MD      Brief Narrative:  Jasmine Colon is a 80 y.o. F with arthritis, Parkinson's disease, breast cancer status postlumpectomy radiotherapy, and recent left hemiarthroplasty on 6/4 who presented from rehab after a fall.  Patient was transferring when she had an accidental fall and landed on her left hip.  X-rays done at the facility revealed a left hip dislocation and she was sent to the ER.      Assessment & Plan:  Dislocated left hip after left hemiarthroplasty - PWB with walker and hip abduction brace - no hip adduction or internal rotation, no hip flexion greater than 90 degrees for 3 months - ASA BID for DVT ppx - Consult orthopedics, appreciate recommendations    Parkinson's disease -Continue Sinemet, rasagiline  Postoperative acute blood loss anemia Her baseline hemoglobin is around 12 g/dL, down to 9 after her hemiarthroplasty, this morning down to 6.7 - Continue iron - Transfuse 1 unit  -Trend CBC   AKI ruled out Patient has some mild increase in her creatinine, likely due to anemia    Acute metabolic encephalopathy At baseline patient is oriented to self and situation and place and time.  Here she is disoriented, pulling at lines, agitated as a result of her anemia, hip dislocation and surgery. Delirium precautions:   -Lights and TV off, minimize interruptions at night  -Blinds open and lights on during day  -Glasses/hearing aid with patient  -Frequent reorientation  -PT/OT when able  -Avoid sedation medications/Beers list medications     Moderate protein calorie malnutrition As evidenced by BMI 18, moderate fat and muscle depletion, poor appetite.    Disposition: Status is: Inpatient  Remains inpatient appropriate because: She has anemia requiring transfusion, she has delirium, which will require specialized equipment which is still pending  and without which she cannot be discharged safely  Dispo: The patient is from: SNF              Anticipated d/c is to: SNF              Patient currently is not medically stable to d/c.   Difficult to place patient No                   MDM: The below labs and imaging reports reviewed and summarized above.  Medication management as above.    DVT prophylaxis: SCDs Start: 09/27/20 0156  Code Status: DNR Family Communication:  Daughter by phone            Subjective: Confusion overnight, improved this morning but still present.  No fever, respiratory distress, chest pain, cough.  No clinical bleeding.          Objective: Vitals:   09/28/20 0447 09/28/20 0739 09/28/20 0945 09/28/20 1000  BP: 109/72 92/66 (!) 84/58 (!) 87/58  Pulse: 88 85 92 86  Resp: 17 16 17 17   Temp: 98.7 F (37.1 C) 98.1 F (36.7 C) 98.1 F (36.7 C) 98.3 F (36.8 C)  TempSrc: Oral  Oral Oral  SpO2: 91% (!) 88% 91% 90%  Weight:      Height:        Intake/Output Summary (Last 24 hours) at 09/28/2020 1249 Last data filed at 09/28/2020 1027 Gross per 24 hour  Intake 340 ml  Output 150 ml  Net 190 ml   Filed Weights   09/27/20 0100 09/27/20 1642  Weight:  49.9 kg 49.9 kg    Examination: General appearance: Thin elderly female, lying in bed, somewhat restless     HEENT: Anicteric, conjunctive are pink, lids and lashes normal.  No nasal deformity, discharge, or epistaxis.  Oropharynx moist, no oral lesions, hearing normal Skin: No suspicious rashes or lesions Cardiac: RRR, no murmurs, no lower extremity edema Respiratory: Normal respiratory rate and rhythm, lungs clear without rales or wheezes Abdomen: Abdomen soft without tenderness palpation or guarding, no ascites or distention MSK: The left hip is abducted, externally rotated. Neuro: Awake and alert, moves upper extremities with normal strength, moderate resting tremor, movements are symmetric, face symmetric, speech  fluent Psych: Patient is interactive, makes eye contact, she is somewhat disoriented, oriented to self and "hospital", but somewhat inattentive, without delusions.    Data Reviewed: I have personally reviewed following labs and imaging studies:  CBC: Recent Labs  Lab 09/22/20 0630 09/27/20 0054 09/27/20 0349 09/28/20 0503  WBC 7.7 10.5 9.1 9.5  NEUTROABS  --  8.2*  --   --   HGB 9.7* 7.9* 7.2* 6.9*  HCT 28.1* 23.1* 21.2* 20.6*  MCV 93.7 94.7 95.1 95.4  PLT 217 346 327 347   Basic Metabolic Panel: Recent Labs  Lab 09/22/20 0630 09/27/20 0054 09/27/20 0349 09/28/20 0503  NA 135 135 136 137  K 3.8 4.2 4.4 4.3  CL 104 103 101 106  CO2 24 23 26 26   GLUCOSE 127* 135* 128* 105*  BUN 14 33* 33* 20  CREATININE 0.74 1.03* 1.01* 0.76  CALCIUM 7.9* 8.2* 8.0* 7.9*   GFR: Estimated Creatinine Clearance: 46.4 mL/min (by C-G formula based on SCr of 0.76 mg/dL). Liver Function Tests: Recent Labs  Lab 09/27/20 0054  AST 50*  ALT 8  ALKPHOS 68  BILITOT 1.6*  PROT 6.6  ALBUMIN 2.9*   No results for input(s): LIPASE, AMYLASE in the last 168 hours. No results for input(s): AMMONIA in the last 168 hours. Coagulation Profile: Recent Labs  Lab 09/27/20 0054  INR 1.1   Cardiac Enzymes: No results for input(s): CKTOTAL, CKMB, CKMBINDEX, TROPONINI in the last 168 hours. BNP (last 3 results) No results for input(s): PROBNP in the last 8760 hours. HbA1C: No results for input(s): HGBA1C in the last 72 hours. CBG: No results for input(s): GLUCAP in the last 168 hours. Lipid Profile: No results for input(s): CHOL, HDL, LDLCALC, TRIG, CHOLHDL, LDLDIRECT in the last 72 hours. Thyroid Function Tests: No results for input(s): TSH, T4TOTAL, FREET4, T3FREE, THYROIDAB in the last 72 hours. Anemia Panel: No results for input(s): VITAMINB12, FOLATE, FERRITIN, TIBC, IRON, RETICCTPCT in the last 72 hours. Urine analysis:    Component Value Date/Time   COLORURINE YELLOW (A) 09/21/2020  0153   APPEARANCEUR CLEAR (A) 09/21/2020 0153   LABSPEC 1.021 09/21/2020 0153   PHURINE 5.0 09/21/2020 0153   GLUCOSEU NEGATIVE 09/21/2020 0153   HGBUR NEGATIVE 09/21/2020 0153   BILIRUBINUR NEGATIVE 09/21/2020 0153   KETONESUR 5 (A) 09/21/2020 0153   PROTEINUR NEGATIVE 09/21/2020 0153   NITRITE NEGATIVE 09/21/2020 0153   LEUKOCYTESUR SMALL (A) 09/21/2020 0153   Sepsis Labs: @LABRCNTIP (procalcitonin:4,lacticacidven:4)  ) Recent Results (from the past 240 hour(s))  Resp Panel by RT-PCR (Flu A&B, Covid) Nasopharyngeal Swab     Status: None   Collection Time: 09/21/20  1:53 AM   Specimen: Nasopharyngeal Swab; Nasopharyngeal(NP) swabs in vial transport medium  Result Value Ref Range Status   SARS Coronavirus 2 by RT PCR NEGATIVE NEGATIVE Final    Comment: (  NOTE) SARS-CoV-2 target nucleic acids are NOT DETECTED.  The SARS-CoV-2 RNA is generally detectable in upper respiratory specimens during the acute phase of infection. The lowest concentration of SARS-CoV-2 viral copies this assay can detect is 138 copies/mL. A negative result does not preclude SARS-Cov-2 infection and should not be used as the sole basis for treatment or other patient management decisions. A negative result may occur with  improper specimen collection/handling, submission of specimen other than nasopharyngeal swab, presence of viral mutation(s) within the areas targeted by this assay, and inadequate number of viral copies(<138 copies/mL). A negative result must be combined with clinical observations, patient history, and epidemiological information. The expected result is Negative.  Fact Sheet for Patients:  EntrepreneurPulse.com.au  Fact Sheet for Healthcare Providers:  IncredibleEmployment.be  This test is no t yet approved or cleared by the [place] FDA and  has been authorized for detection and/or diagnosis of SARS-CoV-2 by FDA under an Emergency Use  Authorization (EUA). This EUA will remain  in effect (meaning this test can be used) for the duration of the COVID-19 declaration under Section 564(b)(1) of the Act, 21 U.S.C.section 360bbb-3(b)(1), unless the authorization is terminated  or revoked sooner.       Influenza A by PCR NEGATIVE NEGATIVE Final   Influenza B by PCR NEGATIVE NEGATIVE Final    Comment: (NOTE) The Xpert Xpress SARS-CoV-2/FLU/RSV plus assay is intended as an aid in the diagnosis of influenza from Nasopharyngeal swab specimens and should not be used as a sole basis for treatment. Nasal washings and aspirates are unacceptable for Xpert Xpress SARS-CoV-2/FLU/RSV testing.  Fact Sheet for Patients: EntrepreneurPulse.com.au  Fact Sheet for Healthcare Providers: IncredibleEmployment.be  This test is not yet approved or cleared by the [place] FDA and has been authorized for detection and/or diagnosis of SARS-CoV-2 by FDA under an Emergency Use Authorization (EUA). This EUA will remain in effect (meaning this test can be used) for the duration of the COVID-19 declaration under Section 564(b)(1) of the Act, 21 U.S.C. section 360bbb-3(b)(1), unless the authorization is terminated or revoked.  Performed at [facility], [address], [place], [place] [address]   Surgical PCR screen     Status: None   Collection Time: 09/21/20  7:20 AM   Specimen: Nasal Mucosa; Nasal Swab  Result Value Ref Range Status   MRSA, PCR NEGATIVE NEGATIVE Final   Staphylococcus aureus NEGATIVE NEGATIVE Final    Comment: (NOTE) The Xpert SA Assay (FDA approved for NASAL specimens in patients 39 years of age and older), is one component of a comprehensive surveillance program. It is not intended to diagnose infection nor to guide or monitor treatment. Performed at [facility], [place], [place] [address]   Resp Panel by RT-PCR (Flu A&B, Covid)  Nasopharyngeal Swab     Status: None   Collection Time: 09/23/20 11:43 AM   Specimen: Nasopharyngeal Swab; Nasopharyngeal(NP) swabs in vial transport medium  Result Value Ref Range Status   SARS Coronavirus 2 by RT PCR NEGATIVE NEGATIVE Final    Comment: (NOTE) SARS-CoV-2 target nucleic acids are NOT DETECTED.  The SARS-CoV-2 RNA is generally detectable in upper respiratory specimens during the acute phase of infection. The lowest concentration of SARS-CoV-2 viral copies this assay can detect is 138 copies/mL. A negative result does not preclude SARS-Cov-2 infection and should not be used as the sole basis for treatment or other patient management decisions. A negative result may occur with  improper specimen collection/handling, submission  of specimen other than nasopharyngeal swab, presence of viral mutation(s) within the areas targeted by this assay, and inadequate number of viral copies(<138 copies/mL). A negative result must be combined with clinical observations, patient history, and epidemiological information. The expected result is Negative.  Fact Sheet for Patients:  EntrepreneurPulse.com.au  Fact Sheet for Healthcare Providers:  IncredibleEmployment.be  This test is no t yet approved or cleared by the [place] FDA and  has been authorized for detection and/or diagnosis of SARS-CoV-2 by FDA under an Emergency Use Authorization (EUA). This EUA will remain  in effect (meaning this test can be used) for the duration of the COVID-19 declaration under Section 564(b)(1) of the Act, 21 U.S.C.section 360bbb-3(b)(1), unless the authorization is terminated  or revoked sooner.       Influenza A by PCR NEGATIVE NEGATIVE Final   Influenza B by PCR NEGATIVE NEGATIVE Final    Comment: (NOTE) The Xpert Xpress SARS-CoV-2/FLU/RSV plus assay is intended as an aid in the diagnosis of influenza from Nasopharyngeal swab specimens and should not be  used as a sole basis for treatment. Nasal washings and aspirates are unacceptable for Xpert Xpress SARS-CoV-2/FLU/RSV testing.  Fact Sheet for Patients: EntrepreneurPulse.com.au  Fact Sheet for Healthcare Providers: IncredibleEmployment.be  This test is not yet approved or cleared by the [place] FDA and has been authorized for detection and/or diagnosis of SARS-CoV-2 by FDA under an Emergency Use Authorization (EUA). This EUA will remain in effect (meaning this test can be used) for the duration of the COVID-19 declaration under Section 564(b)(1) of the Act, 21 U.S.C. section 360bbb-3(b)(1), unless the authorization is terminated or revoked.  Performed at [facility], [address] [place], [place] [address]   Resp Panel by RT-PCR (Flu A&B, Covid) Nasopharyngeal Swab     Status: None   Collection Time: 09/27/20 12:54 AM   Specimen: Nasopharyngeal Swab; Nasopharyngeal(NP) swabs in vial transport medium  Result Value Ref Range Status   SARS Coronavirus 2 by RT PCR NEGATIVE NEGATIVE Final    Comment: (NOTE) SARS-CoV-2 target nucleic acids are NOT DETECTED.  The SARS-CoV-2 RNA is generally detectable in upper respiratory specimens during the acute phase of infection. The lowest concentration of SARS-CoV-2 viral copies this assay can detect is 138 copies/mL. A negative result does not preclude SARS-Cov-2 infection and should not be used as the sole basis for treatment or other patient management decisions. A negative result may occur with  improper specimen collection/handling, submission of specimen other than nasopharyngeal swab, presence of viral mutation(s) within the areas targeted by this assay, and inadequate number of viral copies(<138 copies/mL). A negative result must be combined with clinical observations, patient history, and epidemiological information. The expected result is Negative.  Fact Sheet for Patients:   EntrepreneurPulse.com.au  Fact Sheet for Healthcare Providers:  IncredibleEmployment.be  This test is no t yet approved or cleared by the [place] FDA and  has been authorized for detection and/or diagnosis of SARS-CoV-2 by FDA under an Emergency Use Authorization (EUA). This EUA will remain  in effect (meaning this test can be used) for the duration of the COVID-19 declaration under Section 564(b)(1) of the Act, 21 U.S.C.section 360bbb-3(b)(1), unless the authorization is terminated  or revoked sooner.       Influenza A by PCR NEGATIVE NEGATIVE Final   Influenza B by PCR NEGATIVE NEGATIVE Final    Comment: (NOTE) The Xpert Xpress SARS-CoV-2/FLU/RSV plus assay is intended as an aid in the diagnosis of influenza from Nasopharyngeal  swab specimens and should not be used as a sole basis for treatment. Nasal washings and aspirates are unacceptable for Xpert Xpress SARS-CoV-2/FLU/RSV testing.  Fact Sheet for Patients: EntrepreneurPulse.com.au  Fact Sheet for Healthcare Providers: IncredibleEmployment.be  This test is not yet approved or cleared by the [place] FDA and has been authorized for detection and/or diagnosis of SARS-CoV-2 by FDA under an Emergency Use Authorization (EUA). This EUA will remain in effect (meaning this test can be used) for the duration of the COVID-19 declaration under Section 564(b)(1) of the Act, 21 U.S.C. section 360bbb-3(b)(1), unless the authorization is terminated or revoked.  Performed at [facility], [address], [place], [place] [address]   Surgical PCR screen     Status: None   Collection Time: 09/27/20  5:44 AM   Specimen: Nasal Mucosa; Nasal Swab  Result Value Ref Range Status   MRSA, PCR NEGATIVE NEGATIVE Final   Staphylococcus aureus NEGATIVE NEGATIVE Final    Comment: (NOTE) The Xpert SA Assay (FDA approved for NASAL specimens in patients  61 years of age and older), is one component of a comprehensive surveillance program. It is not intended to diagnose infection nor to guide or monitor treatment. Performed at [facility], [address], [place], [place] [address]          Radiology Studies: DG Chest [place] 1 View  Result Date: 09/27/2020 CLINICAL DATA:  Fall, left hip dislocation EXAM: PORTABLE CHEST 1 VIEW COMPARISON:  09/21/2020 FINDINGS: Lungs are clear. No pneumothorax or pleural effusion. The thoracic aorta is tortuous appears mildly ectatic likely as result of patient positioning. Cardiac size within normal limits. Sigmoid scoliosis of the thoracolumbar spine noted. No acute bone abnormality. IMPRESSION: No active disease. Electronically Signed   By: Fidela Salisbury MD   On: 09/27/2020 02:54   DG HIP OPERATIVE UNILAT W OR W/O PELVIS LEFT  Result Date: 09/27/2020 CLINICAL DATA:  Left hip arthroplasty. EXAM: OPERATIVE LEFT  HIP (WITH PELVIS IF PERFORMED) 1 VIEWS TECHNIQUE: Fluoroscopic spot image(s) were submitted for interpretation post-operatively. COMPARISON:  Plain film of earlier in the day. FINDINGS: 2 intraoperative images demonstrate interval relocation (in 1 plane) of the left hip arthroplasty. No periprosthetic fracture identified. IMPRESSION: Interval relocation of left hip arthroplasty. Electronically Signed   By: Abigail Miyamoto M.D.   On: 09/27/2020 17:54   DG Hip Unilat With Pelvis 2-3 Views Left  Result Date: 09/27/2020 CLINICAL DATA:  Fall, left hip deformity EXAM: DG HIP (WITH OR WITHOUT PELVIS) 2-3V LEFT COMPARISON:  None. FINDINGS: Left hip bipolar hemiarthroplasty has been performed. Since prior examination, there is now superior dislocation in marked internal rotation of the left hip. Small ossific fragments adjacent to the left acetabulum may represent the residua of recent arthroplasty procedure. No displaced fracture identified. The pelvis and visualized right hip are unremarkable.  IMPRESSION: Dislocation of the left hip bipolar hemiarthroplasty. Electronically Signed   By: Fidela Salisbury MD   On: 09/27/2020 02:52        Scheduled Meds:  sodium chloride   Intravenous Once   aspirin EC  81 mg Oral BID   carbidopa-levodopa  1 tablet Oral 6 X Daily   docusate sodium  100 mg Oral BID   feeding supplement  237 mL Oral TID BM   ferrous sulfate  325 mg Oral Q breakfast   multivitamin with minerals  1 tablet Oral Daily   mupirocin ointment  1 application Nasal BID   rasagiline  1 mg Oral  Daily   Continuous Infusions:  sodium chloride 100 mL/hr at 09/27/20 2222   lactated ringers     methocarbamol (ROBAXIN) IV Stopped (09/27/20 0357)     LOS: 0 days    Time spent: 25  minutes    Edwin Dada, MD [facility] 09/28/2020, 12:49 PM     Please page though [place] or Epic secure chat:  For password, contact charge nurse

## 2020-09-28 NOTE — Plan of Care (Signed)
?  Problem: Clinical Measurements: ?Goal: Will remain free from infection ?Outcome: Progressing ?  ?

## 2020-09-28 NOTE — Evaluation (Signed)
Occupational Therapy Evaluation Patient Details Name: Jasmine Colon MRN: [id number] DOB: 05/15/1940 Today's Date: 09/28/2020    History of Present Illness Pt admitted last week  for L femoral neck fracture s/p hip hemiarthroplasty.  She went to rehab and fell with a dislocation, closed reduction 6/10.  History of Parkinsons disease (takes meds 6xs/day), asthma, and HLD.   Clinical Impression   Jasmine Colon was seen for OT evaluation this date. Prior to hospital admission, pt was at [facility] for recent hip fx, prior to fx pt was Independent. Pt presents to acute OT demonstrating impaired ADL performance and functional mobility 2/2 decreased LB access, functional strength/balance deficits, and poor safety awareness. Pt currently requires MIN A for ADL t/f, unable to progress to standing grooming 2/2 reliant on BUE support for PWB. MAX A for LBD at bed level. Extensive education to pt on importance of maintaining posterior hip pcns and hip abd pcns - NT and sitter notified and aware. Pt would benefit from skilled OT to address noted impairments and functional limitations (see below for any additional details) in order to maximize safety and independence while minimizing falls risk and caregiver burden. Upon hospital discharge, recommend STR to maximize pt safety and return to PLOF.     Follow Up Recommendations  SNF;Supervision/Assistance - 24 hour    Equipment Recommendations  3 in 1 bedside commode    Recommendations for Other Services       Precautions / Restrictions Precautions Precautions: Fall;Posterior Hip Required Braces or Orthoses: Other Brace Other Brace: hip abduction brace Restrictions Weight Bearing Restrictions: Yes LLE Weight Bearing: Partial weight bearing      Mobility Bed Mobility Overal bed mobility: Needs Assistance Bed Mobility: Sit to Supine     Sit to supine: Mod assist   General bed mobility comments: assist to maintain post hip and hip abduction pcns     Transfers Overall transfer level: Needs assistance Equipment used: Rolling walker (2 wheeled) Transfers: Sit to/from Stand Sit to Stand: Min assist         General transfer comment: grimacing in standing, pt elects to do toe touch WBing only on LLE. Achieves x2 small steps forward/backward, unable to achieve lateral steps    Balance Overall balance assessment: Needs assistance Sitting-balance support: Feet supported;Bilateral upper extremity supported Sitting balance-Leahy Scale: Good Sitting balance - Comments: requires SBA 2/2 constant dysarthritic movements   Standing balance support: Bilateral upper extremity supported Standing balance-Leahy Scale: Poor Standing balance comment: Pt with heavy UE use on RW, tremoring t/o the standing effort with poor contol and ability to make appropriate adjustments.                           ADL either performed or assessed with clinical judgement   ADL Overall ADL's : Needs assistance/impaired                                       General ADL Comments: MIN A for ADL t/f, unable to progress to standing grooming 2/2 reliant on BUE support for PWB. MAX A for LBD at bed level      Pertinent Vitals/Pain Pain Assessment: 0-10 Pain Score: 4  Pain Location: reports more pain in L thigh/knee than L hip Pain Intervention(s): Repositioned     Hand Dominance Right   Extremity/Trunk Assessment Upper Extremity Assessment Upper Extremity Assessment: Generalized weakness  Lower Extremity Assessment Lower Extremity Assessment: Generalized weakness       Communication Communication Communication: No difficulties   Cognition Arousal/Alertness: Awake/alert Behavior During Therapy: WFL for tasks assessed/performed;Restless Overall Cognitive Status: Within Functional Limits for tasks assessed                                 General Comments: severe dyskinesia throughout session   General  Comments  Pt's Parkinsonian tremors make maintaining hip precautions very problematic.    Exercises Exercises: Other exercises Other Exercises Other Exercises: Pt edcuated re: OT role, DME recs, d/c recs, falls prevention, adapted dressing techinques, post hip pcns, hip abd pcns, PWBing status Other Exercises: LBD, sit>sup, sitting/standing balance/tolerance, sit<>stand   Shoulder Instructions      Home Living Family/patient expects to be discharged to:: [place]: Walker - 2 wheels          Prior Functioning/Environment Level of Independence: Independent        Comments: Pt reports prior to fall/fx last week she was essentially independent, driving, etc        OT Problem List: Decreased range of motion;Decreased strength;Decreased activity tolerance;Impaired balance (sitting and/or standing);Decreased coordination;Decreased safety awareness;Decreased knowledge of use of DME or AE      OT Treatment/Interventions: Self-care/ADL training;Therapeutic exercise;Energy conservation;DME and/or AE instruction;Therapeutic activities;Patient/family education;Balance training    OT Goals(Current goals can be found in the care plan section) Acute Rehab OT Goals Patient Stated Goal: get back to PLOF OT Goal Formulation: With patient Time For Goal Achievement: 10/12/20 Potential to Achieve Goals: Good ADL Goals Pt Will Perform Grooming: with modified independence;sitting Pt Will Perform Lower Body Dressing: with mod assist;sitting/lateral leans Pt Will Transfer to Toilet: with min assist;stand pivot transfer;bedside commode (c LRAD PRN)  OT Frequency: Min 2X/week    AM-PAC OT "6 Clicks" Daily Activity     Outcome Measure Help from another person eating meals?: None Help from another person taking care of personal grooming?: A Little Help from another person toileting, which includes using toliet, bedpan, or  urinal?: A Lot Help from another person bathing (including washing, rinsing, drying)?: A Lot Help from another person to put on and taking off regular upper body clothing?: A Little Help from another person to put on and taking off regular lower body clothing?: A Lot 6 Click Score: 16   End of Session Nurse Communication: Mobility status (pt must wear hip abd brace at all times)  Activity Tolerance: Patient tolerated treatment well Patient left: in bed;with call bell/phone within reach;with bed alarm set;with nursing/sitter in room  OT Visit Diagnosis: Other abnormalities of gait and mobility (R26.89);Muscle weakness (generalized) (M62.81)                Time: 1550-1605 OT Time Calculation (min): 15 min Charges:  OT General Charges $OT Visit: 1 Visit OT Evaluation $OT Eval Low Complexity: 1 Low OT Treatments $Self Care/Home Management : 8-22 mins Dessie Coma, M.S. OTR/L  09/28/20, 4:22 PM  ascom [phone]

## 2020-09-29 DIAGNOSIS — Z4789 Encounter for other orthopedic aftercare: Secondary | ICD-10-CM | POA: Diagnosis not present

## 2020-09-29 DIAGNOSIS — S3739XA Other injury of urethra, initial encounter: Secondary | ICD-10-CM | POA: Diagnosis not present

## 2020-09-29 DIAGNOSIS — N179 Acute kidney failure, unspecified: Secondary | ICD-10-CM | POA: Diagnosis not present

## 2020-09-29 DIAGNOSIS — S73005D Unspecified dislocation of left hip, subsequent encounter: Secondary | ICD-10-CM | POA: Diagnosis not present

## 2020-09-29 DIAGNOSIS — Y92193 Bedroom in other specified residential institution as the place of occurrence of the external cause: Secondary | ICD-10-CM | POA: Diagnosis not present

## 2020-09-29 DIAGNOSIS — F17210 Nicotine dependence, cigarettes, uncomplicated: Secondary | ICD-10-CM | POA: Diagnosis not present

## 2020-09-29 DIAGNOSIS — E440 Moderate protein-calorie malnutrition: Secondary | ICD-10-CM | POA: Diagnosis not present

## 2020-09-29 DIAGNOSIS — J189 Pneumonia, unspecified organism: Secondary | ICD-10-CM | POA: Diagnosis not present

## 2020-09-29 DIAGNOSIS — M6281 Muscle weakness (generalized): Secondary | ICD-10-CM | POA: Diagnosis not present

## 2020-09-29 DIAGNOSIS — D649 Anemia, unspecified: Secondary | ICD-10-CM | POA: Diagnosis not present

## 2020-09-29 DIAGNOSIS — Y831 Surgical operation with implant of artificial internal device as the cause of abnormal reaction of the patient, or of later complication, without mention of misadventure at the time of the procedure: Secondary | ICD-10-CM | POA: Diagnosis present

## 2020-09-29 DIAGNOSIS — Z96649 Presence of unspecified artificial hip joint: Secondary | ICD-10-CM | POA: Diagnosis not present

## 2020-09-29 DIAGNOSIS — S73015A Posterior dislocation of left hip, initial encounter: Secondary | ICD-10-CM | POA: Diagnosis not present

## 2020-09-29 DIAGNOSIS — D509 Iron deficiency anemia, unspecified: Secondary | ICD-10-CM | POA: Diagnosis not present

## 2020-09-29 DIAGNOSIS — Z853 Personal history of malignant neoplasm of breast: Secondary | ICD-10-CM | POA: Diagnosis not present

## 2020-09-29 DIAGNOSIS — M159 Polyosteoarthritis, unspecified: Secondary | ICD-10-CM | POA: Diagnosis not present

## 2020-09-29 DIAGNOSIS — J302 Other seasonal allergic rhinitis: Secondary | ICD-10-CM | POA: Diagnosis not present

## 2020-09-29 DIAGNOSIS — Z923 Personal history of irradiation: Secondary | ICD-10-CM | POA: Diagnosis not present

## 2020-09-29 DIAGNOSIS — Z823 Family history of stroke: Secondary | ICD-10-CM | POA: Diagnosis not present

## 2020-09-29 DIAGNOSIS — Z20822 Contact with and (suspected) exposure to covid-19: Secondary | ICD-10-CM | POA: Diagnosis not present

## 2020-09-29 DIAGNOSIS — M810 Age-related osteoporosis without current pathological fracture: Secondary | ICD-10-CM | POA: Diagnosis not present

## 2020-09-29 DIAGNOSIS — X501XXA Overexertion from prolonged static or awkward postures, initial encounter: Secondary | ICD-10-CM | POA: Diagnosis not present

## 2020-09-29 DIAGNOSIS — F339 Major depressive disorder, recurrent, unspecified: Secondary | ICD-10-CM | POA: Diagnosis not present

## 2020-09-29 DIAGNOSIS — F05 Delirium due to known physiological condition: Secondary | ICD-10-CM | POA: Diagnosis not present

## 2020-09-29 DIAGNOSIS — Z7982 Long term (current) use of aspirin: Secondary | ICD-10-CM | POA: Diagnosis not present

## 2020-09-29 DIAGNOSIS — Z79899 Other long term (current) drug therapy: Secondary | ICD-10-CM | POA: Diagnosis not present

## 2020-09-29 DIAGNOSIS — Z96642 Presence of left artificial hip joint: Secondary | ICD-10-CM | POA: Diagnosis not present

## 2020-09-29 DIAGNOSIS — Z8249 Family history of ischemic heart disease and other diseases of the circulatory system: Secondary | ICD-10-CM | POA: Diagnosis not present

## 2020-09-29 DIAGNOSIS — M25559 Pain in unspecified hip: Secondary | ICD-10-CM | POA: Diagnosis not present

## 2020-09-29 DIAGNOSIS — Z471 Aftercare following joint replacement surgery: Secondary | ICD-10-CM | POA: Diagnosis not present

## 2020-09-29 DIAGNOSIS — T84029A Dislocation of unspecified internal joint prosthesis, initial encounter: Secondary | ICD-10-CM | POA: Diagnosis not present

## 2020-09-29 DIAGNOSIS — G249 Dystonia, unspecified: Secondary | ICD-10-CM | POA: Diagnosis not present

## 2020-09-29 DIAGNOSIS — S72002A Fracture of unspecified part of neck of left femur, initial encounter for closed fracture: Secondary | ICD-10-CM | POA: Diagnosis not present

## 2020-09-29 DIAGNOSIS — M1990 Unspecified osteoarthritis, unspecified site: Secondary | ICD-10-CM | POA: Diagnosis not present

## 2020-09-29 DIAGNOSIS — S73005A Unspecified dislocation of left hip, initial encounter: Secondary | ICD-10-CM | POA: Diagnosis not present

## 2020-09-29 DIAGNOSIS — T84021A Dislocation of internal left hip prosthesis, initial encounter: Secondary | ICD-10-CM | POA: Diagnosis not present

## 2020-09-29 DIAGNOSIS — J45909 Unspecified asthma, uncomplicated: Secondary | ICD-10-CM | POA: Diagnosis not present

## 2020-09-29 DIAGNOSIS — R52 Pain, unspecified: Secondary | ICD-10-CM | POA: Diagnosis not present

## 2020-09-29 DIAGNOSIS — Y9389 Activity, other specified: Secondary | ICD-10-CM | POA: Diagnosis not present

## 2020-09-29 DIAGNOSIS — W19XXXD Unspecified fall, subsequent encounter: Secondary | ICD-10-CM | POA: Diagnosis not present

## 2020-09-29 DIAGNOSIS — D62 Acute posthemorrhagic anemia: Secondary | ICD-10-CM | POA: Diagnosis not present

## 2020-09-29 DIAGNOSIS — M25552 Pain in left hip: Secondary | ICD-10-CM | POA: Diagnosis not present

## 2020-09-29 DIAGNOSIS — J309 Allergic rhinitis, unspecified: Secondary | ICD-10-CM | POA: Diagnosis not present

## 2020-09-29 DIAGNOSIS — E785 Hyperlipidemia, unspecified: Secondary | ICD-10-CM | POA: Diagnosis not present

## 2020-09-29 DIAGNOSIS — F32A Depression, unspecified: Secondary | ICD-10-CM | POA: Diagnosis not present

## 2020-09-29 DIAGNOSIS — S72002D Fracture of unspecified part of neck of left femur, subsequent encounter for closed fracture with routine healing: Secondary | ICD-10-CM | POA: Diagnosis not present

## 2020-09-29 DIAGNOSIS — R2681 Unsteadiness on feet: Secondary | ICD-10-CM | POA: Diagnosis not present

## 2020-09-29 DIAGNOSIS — J310 Chronic rhinitis: Secondary | ICD-10-CM | POA: Diagnosis not present

## 2020-09-29 DIAGNOSIS — I959 Hypotension, unspecified: Secondary | ICD-10-CM | POA: Diagnosis not present

## 2020-09-29 DIAGNOSIS — K5900 Constipation, unspecified: Secondary | ICD-10-CM | POA: Diagnosis not present

## 2020-09-29 DIAGNOSIS — R5381 Other malaise: Secondary | ICD-10-CM | POA: Diagnosis not present

## 2020-09-29 DIAGNOSIS — R4189 Other symptoms and signs involving cognitive functions and awareness: Secondary | ICD-10-CM | POA: Diagnosis not present

## 2020-09-29 DIAGNOSIS — R918 Other nonspecific abnormal finding of lung field: Secondary | ICD-10-CM | POA: Diagnosis not present

## 2020-09-29 DIAGNOSIS — T84021D Dislocation of internal left hip prosthesis, subsequent encounter: Secondary | ICD-10-CM | POA: Diagnosis not present

## 2020-09-29 DIAGNOSIS — G20 Parkinson's disease: Secondary | ICD-10-CM | POA: Diagnosis not present

## 2020-09-29 DIAGNOSIS — Z681 Body mass index (BMI) 19 or less, adult: Secondary | ICD-10-CM | POA: Diagnosis not present

## 2020-09-29 LAB — BASIC METABOLIC PANEL
Anion gap: 3 — ABNORMAL LOW (ref 5–15)
BUN: 17 mg/dL (ref 8–23)
CO2: 26 mmol/L (ref 22–32)
Calcium: 7.8 mg/dL — ABNORMAL LOW (ref 8.9–10.3)
Chloride: 107 mmol/L (ref 98–111)
Creatinine, Ser: 0.69 mg/dL (ref 0.44–1.00)
GFR, Estimated: >60 mL/min (ref >60)
Glucose, Bld: 97 mg/dL (ref 70–99)
Potassium: 3.7 mmol/L (ref 3.5–5.1)
Sodium: 136 mmol/L (ref 135–145)

## 2020-09-29 LAB — CBC
HCT: 24.7 % — ABNORMAL LOW (ref 36.0–46.0)
Hemoglobin: 8.4 g/dL — ABNORMAL LOW (ref 12.0–15.0)
MCH: 32.6 pg (ref 26.0–34.0)
MCHC: 34 g/dL (ref 30.0–36.0)
MCV: 95.7 fL (ref 80.0–100.0)
Platelets: 399 10*3/uL (ref 150–400)
RBC: 2.58 MIL/uL — ABNORMAL LOW (ref 3.87–5.11)
RDW: 15 % (ref 11.5–15.5)
WBC: 9.6 10*3/uL (ref 4.0–10.5)
nRBC: 0.5 % — ABNORMAL HIGH (ref 0.0–0.2)

## 2020-09-29 LAB — BPAM RBC
Blood Product Expiration Date: 202206222359
ISSUE DATE / TIME: 202206110943
Unit Type and Rh: 600

## 2020-09-29 LAB — TYPE AND SCREEN
ABO/RH(D): A POS
Antibody Screen: NEGATIVE
Unit division: 0

## 2020-09-29 MED ORDER — OXYCODONE HCL 5 MG PO TABS: 5.0000 mg | ORAL_TABLET | ORAL | 0 refills | Status: DC | PRN

## 2020-09-29 MED ORDER — ACETAMINOPHEN 500 MG PO TABS: 1000.0000 mg | ORAL_TABLET | Freq: Three times a day (TID) | ORAL | 0 refills | Status: DC

## 2020-09-29 MED ORDER — ASPIRIN EC 325 MG PO TBEC: 325.0000 mg | DELAYED_RELEASE_TABLET | Freq: Two times a day (BID) | ORAL | 0 refills | Status: DC

## 2020-09-29 MED ORDER — ENSURE ENLIVE PO LIQD: 237.0000 mL | Freq: Three times a day (TID) | ORAL | 12 refills | Status: AC

## 2020-09-29 NOTE — Plan of Care (Signed)
Patient discharged per MD orders at this time.All discharge instructions,education and medication reviewed with patient and family.Pt expressed understanding and will comply with dc instructions.follow up appointments also communicated to patient and family.no verbal c/o or any ssx of distress.Patient discharged to the [facility] per order.dc instructions/orders communicated to admissions nurse,[facility] before leaving the unit.Pt transported by daughter in a private car.

## 2020-09-29 NOTE — TOC Progression Note (Addendum)
Transition of Care [facility]) - Progression Note    Patient Details  Name: Jasmine Colon MRN: [id number] Date of Birth: February 16, 1941  [facility] CM/SW Contact  Minor Iden, [place], [place] Phone [phone] 09/29/2020, 1:07 PM  Clinical Narrative:    HTA  insurance authorization started for patient's return to [facility] with Marlowe Kays [phone]. [facility] authorization requested as well.   3:29 pm Patient's family will provide transportation back to [facility].   [address] LCSW Transition of Care [phone]    Expected Discharge Plan: Skilled Nursing Facility Barriers to Discharge: Continued Medical Work up  Expected Discharge Plan and Services Expected Discharge Plan: [place] Acute Care Choice: Resumption of Svcs/PTA Provider Living arrangements for the past 2 months: Single Family Home                                       Social Determinants of Health (SDOH) Interventions    Readmission Risk Interventions No flowsheet data found.

## 2020-09-29 NOTE — TOC Transition Note (Signed)
Transition of Care [facility]) - CM/SW Discharge Note   Patient Details  Name: Jasmine Colon MRN: [id number] Date of Birth: April 11, 1941  Transition of Care [facility]) CM/SW Contact:  Elliot Gurney [place], [place] Phone Number:[phone] 09/29/2020, 4:15 PM   Clinical Narrative:    Patient to discharge to [facility] today. Patient's daughter and spouse to provide transport. Report to be called in to [phone].  HTA authorization: 7 days-Authorization number [id number].  Otila Starn, LCSW Transition of Care [phone]      Barriers to Discharge: Continued Medical Work up   Patient Goals and CMS Choice     Choice offered to / list presented to : Patient, Spouse  Discharge Placement                       Discharge Plan and Services     Post Acute Care Choice: Resumption of Svcs/PTA Provider                               Social Determinants of Health (SDOH) Interventions     Readmission Risk Interventions No flowsheet data found.

## 2020-09-29 NOTE — NC FL2 (Signed)
[place] LEVEL OF CARE SCREENING TOOL     IDENTIFICATION  Patient Name: Jasmine Colon Birthdate: October 24, 1940 Sex: female Admission Date (Current Location): 09/27/2020  [facility] and [place] Number:  Engineering geologist and Address:  [facility], [address], [place], [place] [address]      Provider Number: [id number]  Attending Physician Name and Address:  Edwin Dada, *  Relative Name and Phone Number:  [name] Daughter [phone]    Current Level of Care:   Recommended Level of Care: [place] Prior Approval Number:    Date Approved/Denied:   PASRR Number: [id number]  Discharge Plan: SNF    Current Diagnoses: Patient Active Problem List   Diagnosis Date Noted   Dislocated hip ([place]) 09/28/2020   Dislocation of hip prosthesis, initial encounter ([employer]) 09/27/2020   Dislocation of hip joint prosthesis, initial encounter ([place]) 09/27/2020   Postoperative anemia 09/27/2020   AKI (acute kidney injury) ([place]) 09/27/2020   Malnutrition of moderate degree 09/27/2020   Accidental fall    Hyperlipidemia    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    Left displaced femoral neck fracture ([place]) 09/21/2020   Breast cancer ([place])    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   Adenocarcinoma, 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   Hip pain, left 07/19/2012   Back pain, lumbar 07/19/2012   Osteoporosis, unspecified 07/19/2012   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   Parkinson disease ([place]) 07/19/2012    Orientation RESPIRATION BLADDER Height & Weight     Self, Place  Normal Continent Weight: 110 lb 0.2 oz (49.9 kg) Height:  5\' 5"  (165.1 cm)  BEHAVIORAL SYMPTOMS/MOOD NEUROLOGICAL BOWEL NUTRITION STATUS      Continent Diet  AMBULATORY STATUS COMMUNICATION OF NEEDS Skin   Extensive Assist Verbally Surgical wounds                       Personal Care Assistance Level of  Assistance  Bathing, Feeding, Dressing Bathing Assistance: Limited assistance Feeding assistance: Limited assistance Dressing Assistance: Limited assistance     Functional Limitations Info  Sight, Hearing, Speech Sight Info: Adequate Hearing Info: Adequate Speech Info: Adequate    SPECIAL CARE FACTORS FREQUENCY  PT (By licensed PT), OT (By licensed OT)     PT Frequency: 5x per week OT Frequency: 5x per week            Contractures Contractures Info: Not present    Additional Factors Info  Code Status, Allergies Code Status Info: DNR Allergies Info: codeine, Pseudoephedrine           Current Medications (09/29/2020):  This is the current hospital active medication list Current Facility-Administered Medications  Medication Dose Route Frequency Provider Last Rate Last Admin   0.9 %  sodium chloride infusion (Manually program via Guardrails IV Fluids)   Intravenous Once [place], Suann Larry, MD       acetaminophen (TYLENOL) tablet 1,000 mg  1,000 mg Oral TID Edwin Dada, MD   1,000 mg at 09/29/20 1534   aspirin EC tablet 81 mg  81 mg Oral BID Renee Harder, MD   81 mg at 09/29/20 0847   carbidopa-levodopa (SINEMET IR) 25-100 MG per tablet immediate release 1 tablet  1 tablet Oral 6 X Daily Renee Harder, MD   1 tablet at 09/29/20 1419   docusate sodium (COLACE) capsule 100 mg  100 mg Oral BID Renee Harder, MD  100 mg at 09/29/20 1027   feeding supplement (ENSURE ENLIVE / ENSURE PLUS) liquid 237 mL  237 mL Oral TID BM Renee Harder, MD   237 mL at 09/29/20 1309   ferrous sulfate tablet 325 mg  325 mg Oral Q breakfast Renee Harder, MD   325 mg at 09/29/20 0847   methocarbamol (ROBAXIN) tablet 500 mg  500 mg Oral Q6H PRN Renee Harder, MD   500 mg at 09/28/20 2140   Or   methocarbamol (ROBAXIN) 500 mg in dextrose 5 % 50 mL IVPB  500 mg Intravenous Q6H PRN Renee Harder, MD   Stopped at 09/27/20 0357   multivitamin with minerals tablet  1 tablet  1 tablet Oral Daily Renee Harder, MD   1 tablet at 09/29/20 1027   mupirocin ointment (BACTROBAN) 2 % 1 application  1 application Nasal BID Renee Harder, MD   1 application at 76/28/31 1027   oxyCODONE (Oxy IR/ROXICODONE) immediate release tablet 5 mg  5 mg Oral Q4H PRN Edwin Dada, MD       rasagiline (AZILECT) tablet 1 mg  1 mg Oral Daily Renee Harder, MD   1 mg at 09/29/20 1027   senna-docusate (Senokot-S) tablet 1 tablet  1 tablet Oral QHS PRN Renee Harder, MD   1 tablet at 09/28/20 2141     Discharge Medications: Please see discharge summary for a list of discharge medications.  Relevant Imaging Results:  Relevant Lab Results:   Additional Information [id number]  Elliot Gurney [place],

## 2020-09-29 NOTE — Plan of Care (Signed)
  Problem: Pain Managment: Goal: General experience of comfort will improve Outcome: Progressing   

## 2020-09-29 NOTE — Discharge Summary (Addendum)
Physician Discharge Summary  Jasmine Colon JJK:[id number] DOB: Oct 24, 1940 DOA: 09/27/2020  PCP: Dion Body, MD  Admit date: 09/27/2020 Discharge date: 09/29/2020  Admitted From: SNF  Disposition:  SNF   Recommendations for Outpatient Follow-up:  Follow up with Dr. Sabra Heck in 1 week Continue aspirin EC 325 mg twice daily for 2 weeks or until stopped by Dr. Sabra Heck Hip precautions: Wear brace as directed by Dr. Sabra Heck, avoid hip adduction or internal rotation, no hip flexion greater than 90 degrees for 3 months Please obtain CBC in 1 week Follow up with PCP and check iron studies in 6 weeks       Home Health: N/A  Equipment/Devices: TBD at SNF  Discharge Condition: Fair  CODE STATUS: DNR Diet recommendation: Regular  Brief/Interim Summary: Jasmine Colon is a 80 y.o. F with arthritis, Parkinson's disease, breast cancer status postlumpectomy radiotherapy, and recent left hemiarthroplasty on 6/4 who presented from rehab after a fall.  Patient was transferring when she had an accidental fall and landed on her left hip.  X-rays done at the facility revealed a left hip dislocation and she was sent to the ER.       PRINCIPAL HOSPITAL DIAGNOSIS: Hip dislocation    Discharge Diagnoses:   Dislocated left hip after left hemiarthroplasty Admitted and taken to the OR by Dr. Sharlet Salina for reduction of left hip.  Placed in hip brace: no hip adduction or internal rotation, no hip flexion greater than 90 degrees for 3 months.  ASA 325 BID for DVT ppx and follow up with Dr. 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     AKI ruled out Patient has some mild increase in her creatinine, likely due to anemia   Acute  metabolic encephalopathy Mild sundowning    Moderate protein calorie malnutrition As evidenced by BMI 18, moderate fat and muscle depletion, poor appetite.              Discharge Instructions   Allergies as of 09/29/2020       Reactions   Codeine    Feels weird   Pseudoephedrine    Other reaction(s): Dizziness and giddiness (finding)        Medication List     STOP taking these medications    HYDROcodone-acetaminophen 5-325 MG tablet Commonly known as: NORCO/VICODIN       TAKE these medications    acetaminophen 500 MG tablet Commonly known as: TYLENOL Take 2 tablets (1,000 mg total) by mouth 3 (three) times daily for 5 days.   aspirin EC 325 MG tablet Take 1 tablet (325 mg total) by mouth 2 (two) times daily for 14 days. What changed:  medication strength how much to take when to take this additional instructions   CALTRATE 600+D PO Take 3 tablets by mouth 3 (three) times daily.   carbidopa-levodopa 25-100 MG tablet Commonly known as: SINEMET IR Take 1 tablet by mouth 6 (six) times daily.   cyanocobalamin 1000 MCG tablet Take 100 mcg by mouth daily.   docusate sodium 100 MG capsule Commonly known as: COLACE Take 1 capsule (100 mg total) by mouth 2 (two) times daily.   feeding supplement Liqd Take 237 mLs by mouth 3 (three) times daily between meals.   ferrous sulfate  325 (65 FE) MG tablet Take 1 tablet (325 mg total) by mouth daily with breakfast.   fluticasone 50 MCG/ACT nasal spray Commonly known as: FLONASE Place 2 sprays into both nostrils daily as needed for allergies or rhinitis.   Ibuprofen 200 MG Caps Take 400 mg by mouth every 8 (eight) hours as needed.   loratadine 10 MG tablet Commonly known as: CLARITIN Take 10 mg by mouth daily as needed for allergies.   mirtazapine 30 MG tablet Commonly known as: REMERON Take 30 mg by mouth at bedtime.   oxyCODONE 5 MG immediate release tablet Commonly known as: Oxy  IR/ROXICODONE Take 1 tablet (5 mg total) by mouth every 4 (four) hours as needed for moderate pain.   PRESERVISION AREDS PO Take by mouth daily.   rasagiline 1 MG Tabs tablet Commonly known as: AZILECT Take 1 tablet (1 mg total) by mouth daily.   sodium chloride 0.65 % Soln nasal spray Commonly known as: OCEAN Place 1 spray into both nostrils as needed for congestion.        Follow-up Information     Earnestine Leys, MD. Go in 1 week(s).   Specialty: Orthopedic Surgery Contact information: [place] [address] [phone]                Allergies  Allergen Reactions   Codeine     Feels weird   Pseudoephedrine     Other reaction(s): Dizziness and giddiness (finding)    Consultations: Orthpedics   Procedures/Studies: DG Chest 1 View  Result Date: 09/21/2020 CLINICAL DATA:  Fall, left chest pain EXAM: CHEST  1 VIEW COMPARISON:  None. FINDINGS: Lungs are well expanded, symmetric, and clear. No pneumothorax or pleural effusion. Cardiac size within normal limits. Pulmonary vascularity is normal. Osseous structures are age-appropriate. Mild thoracic sigmoid scoliosis noted. No acute bone abnormality. IMPRESSION: No active disease. Electronically Signed   By: Fidela Salisbury MD   On: 09/21/2020 01:13   DG Chest Port 1 View  Result Date: 09/27/2020 CLINICAL DATA:  Fall, left hip dislocation EXAM: PORTABLE CHEST 1 VIEW COMPARISON:  09/21/2020 FINDINGS: Lungs are clear. No pneumothorax or pleural effusion. The thoracic aorta is tortuous appears mildly ectatic likely as result of patient positioning. Cardiac size within normal limits. Sigmoid scoliosis of the thoracolumbar spine noted. No acute bone abnormality. IMPRESSION: No active disease. Electronically Signed   By: Fidela Salisbury MD   On: 09/27/2020 02:54   DG Hip Port Unilat With Pelvis 1V Left  Result Date: 09/21/2020 CLINICAL DATA:  Postop LEFT hip surgery EXAM: DG HIP (WITH OR WITHOUT PELVIS)  1V PORT LEFT COMPARISON:  None. FINDINGS: Unipolar LEFT hip arthroplasty. Expected soft tissue changes. No fracture or dislocation. IMPRESSION: No complication following LEFT hip arthroplasty. Electronically Signed   By: Suzy Bouchard M.D.   On: 09/21/2020 14:00   DG HIP OPERATIVE UNILAT W OR W/O PELVIS LEFT  Result Date: 09/27/2020 CLINICAL DATA:  Left hip arthroplasty. EXAM: OPERATIVE LEFT  HIP (WITH PELVIS IF PERFORMED) 1 VIEWS TECHNIQUE: Fluoroscopic spot image(s) were submitted for interpretation post-operatively. COMPARISON:  Plain film of earlier in the day. FINDINGS: 2 intraoperative images demonstrate interval relocation (in 1 plane) of the left hip arthroplasty. No periprosthetic fracture identified. IMPRESSION: Interval relocation of left hip arthroplasty. Electronically Signed   By: Abigail Miyamoto M.D.   On: 09/27/2020 17:54   DG Hip Unilat With Pelvis 2-3 Views Left  Result Date: 09/27/2020 CLINICAL DATA:  Fall, left hip deformity  EXAM: DG HIP (WITH OR WITHOUT PELVIS) 2-3V LEFT COMPARISON:  None. FINDINGS: Left hip bipolar hemiarthroplasty has been performed. Since prior examination, there is now superior dislocation in marked internal rotation of the left hip. Small ossific fragments adjacent to the left acetabulum may represent the residua of recent arthroplasty procedure. No displaced fracture identified. The pelvis and visualized right hip are unremarkable. IMPRESSION: Dislocation of the left hip bipolar hemiarthroplasty. Electronically Signed   By: Fidela Salisbury MD   On: 09/27/2020 02:52   DG Hip Unilat W or Wo Pelvis 2-3 Views Left  Result Date: 09/21/2020 CLINICAL DATA:  Pt states she had fall around 7pm tonight and fell on her left side and is having pain to left upper leg. Pt has hx of parkinsons disease and has noted involuntary movement. EXAM: LEFT FEMUR PORTABLE 2 VIEWS; DG HIP (WITH OR WITHOUT PELVIS) 2-3V LEFT COMPARISON:  None. FINDINGS: Otherwise no acute displaced fracture  of the bones of the pelvis. No pelvic diastasis. Poorly visualized fractured and displaced left femoral neck. The distal left femur demonstrates no acute displaced fracture. Visualized portions of the left knee demonstrate tricompartmental degenerative changes in likely a small joint effusion. Frontal view of the visualized right hip grossly unremarkable. Visualized lumbar spine demonstrates degenerative changes. IMPRESSION: 1. Poorly visualized fractured and displaced left femoral neck. Recommend CT noncontrast for further evaluation. 2. No acute displaced fracture of the distal left femur. 3. No acute displaced fracture or diastasis of the bones of the pelvis. 4. Partially visualized frontal view of the right hip grossly unremarkable. 5. Tricompartmental degenerative changes of the left knee. Electronically Signed   By: Iven Finn M.D.   On: 09/21/2020 01:12   DG FEMUR PORT MIN 2 VIEWS LEFT  Result Date: 09/21/2020 CLINICAL DATA:  Pt states she had fall around 7pm tonight and fell on her left side and is having pain to left upper leg. Pt has hx of parkinsons disease and has noted involuntary movement. EXAM: LEFT FEMUR PORTABLE 2 VIEWS; DG HIP (WITH OR WITHOUT PELVIS) 2-3V LEFT COMPARISON:  None. FINDINGS: Otherwise no acute displaced fracture of the bones of the pelvis. No pelvic diastasis. Poorly visualized fractured and displaced left femoral neck. The distal left femur demonstrates no acute displaced fracture. Visualized portions of the left knee demonstrate tricompartmental degenerative changes in likely a small joint effusion. Frontal view of the visualized right hip grossly unremarkable. Visualized lumbar spine demonstrates degenerative changes. IMPRESSION: 1. Poorly visualized fractured and displaced left femoral neck. Recommend CT noncontrast for further evaluation. 2. No acute displaced fracture of the distal left femur. 3. No acute displaced fracture or diastasis of the bones of the pelvis. 4.  Partially visualized frontal view of the right hip grossly unremarkable. 5. Tricompartmental degenerative changes of the left knee. Electronically Signed   By: Iven Finn M.D.   On: 09/21/2020 01:12      Subjective: Feeling well.  No confusion, no weakness, no vomiting.  No chest pain or dyspnea.  Discharge Exam: Vitals:   09/28/20 2115 09/29/20 0802  BP: (!) 93/59 102/69  Pulse: 90 89  Resp: 18 18  Temp: (!) 97.5 F (36.4 C) 98.6 F (37 C)  SpO2: 92% 91%   Vitals:   09/28/20 1000 09/28/20 1300 09/28/20 2115 09/29/20 0802  BP: (!) 87/58 102/70 (!) 93/59 102/69  Pulse: 86 97 90 89  Resp: 17 17 18 18   Temp: 98.3 F (36.8 C) 98.7 F (37.1 C) (!) 97.5 F (36.4 C)  98.6 F (37 C)  TempSrc: Oral Oral    SpO2: 90% 97% 92% 91%  Weight:      Height:        General: Pt is alert, awake, not in acute distress Cardiovascular: RRR, nl S1-S2, no murmurs appreciated.   No LE edema.   Respiratory: Normal respiratory rate and rhythm.  CTAB without rales or wheezes. Abdominal: Abdomen soft and non-tender.  No distension or HSM.   MSK: The incsion dressing is clean and dry.  No surrounding bleeding, bruising or eryhma. Neuro/Psych: Strength symmetric in upper and lower extremities.  Judgment and insight appear normal, maybe mild disorientation.   The results of significant diagnostics from this hospitalization (including imaging, microbiology, ancillary and laboratory) are listed below for reference.     Microbiology: Recent Results (from the past 240 hour(s))  Resp Panel by RT-PCR (Flu A&B, Covid) Nasopharyngeal Swab     Status: None   Collection Time: 09/21/20  1:53 AM   Specimen: Nasopharyngeal Swab; Nasopharyngeal(NP) swabs in vial transport medium  Result Value Ref Range Status   SARS Coronavirus 2 by RT PCR NEGATIVE NEGATIVE Final    Comment: (NOTE) SARS-CoV-2 target nucleic acids are NOT DETECTED.  The SARS-CoV-2 RNA is generally detectable in upper  respiratory specimens during the acute phase of infection. The lowest concentration of SARS-CoV-2 viral copies this assay can detect is 138 copies/mL. A negative result does not preclude SARS-Cov-2 infection and should not be used as the sole basis for treatment or other patient management decisions. A negative result may occur with  improper specimen collection/handling, submission of specimen other than nasopharyngeal swab, presence of viral mutation(s) within the areas targeted by this assay, and inadequate number of viral copies(<138 copies/mL). A negative result must be combined with clinical observations, patient history, and epidemiological information. The expected result is Negative.  Fact Sheet for Patients:  EntrepreneurPulse.com.au  Fact Sheet for Healthcare Providers:  IncredibleEmployment.be  This test is no t yet approved or cleared by the [place] FDA and  has been authorized for detection and/or diagnosis of SARS-CoV-2 by FDA under an Emergency Use Authorization (EUA). This EUA will remain  in effect (meaning this test can be used) for the duration of the COVID-19 declaration under Section 564(b)(1) of the Act, 21 U.S.C.section 360bbb-3(b)(1), unless the authorization is terminated  or revoked sooner.       Influenza A by PCR NEGATIVE NEGATIVE Final   Influenza B by PCR NEGATIVE NEGATIVE Final    Comment: (NOTE) The Xpert Xpress SARS-CoV-2/FLU/RSV plus assay is intended as an aid in the diagnosis of influenza from Nasopharyngeal swab specimens and should not be used as a sole basis for treatment. Nasal washings and aspirates are unacceptable for Xpert Xpress SARS-CoV-2/FLU/RSV testing.  Fact Sheet for Patients: EntrepreneurPulse.com.au  Fact Sheet for Healthcare Providers: IncredibleEmployment.be  This test is not yet approved or cleared by the [place] FDA and has been  authorized for detection and/or diagnosis of SARS-CoV-2 by FDA under an Emergency Use Authorization (EUA). This EUA will remain in effect (meaning this test can be used) for the duration of the COVID-19 declaration under Section 564(b)(1) of the Act, 21 U.S.C. section 360bbb-3(b)(1), unless the authorization is terminated or revoked.  Performed at [facility], [address] [place], [place] [address]   Surgical PCR screen     Status: None   Collection Time: 09/21/20  7:20 AM   Specimen: Nasal Mucosa; Nasal Swab  Result Value Ref Range Status  MRSA, PCR NEGATIVE NEGATIVE Final   Staphylococcus aureus NEGATIVE NEGATIVE Final    Comment: (NOTE) The Xpert SA Assay (FDA approved for NASAL specimens in patients 37 years of age and older), is one component of a comprehensive surveillance program. It is not intended to diagnose infection nor to guide or monitor treatment. Performed at [facility], [place], [place] [address]   Resp Panel by RT-PCR (Flu A&B, Covid) Nasopharyngeal Swab     Status: None   Collection Time: 09/23/20 11:43 AM   Specimen: Nasopharyngeal Swab; Nasopharyngeal(NP) swabs in vial transport medium  Result Value Ref Range Status   SARS Coronavirus 2 by RT PCR NEGATIVE NEGATIVE Final    Comment: (NOTE) SARS-CoV-2 target nucleic acids are NOT DETECTED.  The SARS-CoV-2 RNA is generally detectable in upper respiratory specimens during the acute phase of infection. The lowest concentration of SARS-CoV-2 viral copies this assay can detect is 138 copies/mL. A negative result does not preclude SARS-Cov-2 infection and should not be used as the sole basis for treatment or other patient management decisions. A negative result may occur with  improper specimen collection/handling, submission of specimen other than nasopharyngeal swab, presence of viral mutation(s) within the areas targeted by this assay, and inadequate number of  viral copies(<138 copies/mL). A negative result must be combined with clinical observations, patient history, and epidemiological information. The expected result is Negative.  Fact Sheet for Patients:  EntrepreneurPulse.com.au  Fact Sheet for Healthcare Providers:  IncredibleEmployment.be  This test is no t yet approved or cleared by the [place] FDA and  has been authorized for detection and/or diagnosis of SARS-CoV-2 by FDA under an Emergency Use Authorization (EUA). This EUA will remain  in effect (meaning this test can be used) for the duration of the COVID-19 declaration under Section 564(b)(1) of the Act, 21 U.S.C.section 360bbb-3(b)(1), unless the authorization is terminated  or revoked sooner.       Influenza A by PCR NEGATIVE NEGATIVE Final   Influenza B by PCR NEGATIVE NEGATIVE Final    Comment: (NOTE) The Xpert Xpress SARS-CoV-2/FLU/RSV plus assay is intended as an aid in the diagnosis of influenza from Nasopharyngeal swab specimens and should not be used as a sole basis for treatment. Nasal washings and aspirates are unacceptable for Xpert Xpress SARS-CoV-2/FLU/RSV testing.  Fact Sheet for Patients: EntrepreneurPulse.com.au  Fact Sheet for Healthcare Providers: IncredibleEmployment.be  This test is not yet approved or cleared by the [place] FDA and has been authorized for detection and/or diagnosis of SARS-CoV-2 by FDA under an Emergency Use Authorization (EUA). This EUA will remain in effect (meaning this test can be used) for the duration of the COVID-19 declaration under Section 564(b)(1) of the Act, 21 U.S.C. section 360bbb-3(b)(1), unless the authorization is terminated or revoked.  Performed at [facility], [address] [place], [place] [address]   Resp Panel by RT-PCR (Flu A&B, Covid) Nasopharyngeal Swab     Status: None   Collection Time: 09/27/20 12:54  AM   Specimen: Nasopharyngeal Swab; Nasopharyngeal(NP) swabs in vial transport medium  Result Value Ref Range Status   SARS Coronavirus 2 by RT PCR NEGATIVE NEGATIVE Final    Comment: (NOTE) SARS-CoV-2 target nucleic acids are NOT DETECTED.  The SARS-CoV-2 RNA is generally detectable in upper respiratory specimens during the acute phase of infection. The lowest concentration of SARS-CoV-2 viral copies this assay can detect is 138 copies/mL. A negative result does not preclude SARS-Cov-2 infection and should not be used  as the sole basis for treatment or other patient management decisions. A negative result may occur with  improper specimen collection/handling, submission of specimen other than nasopharyngeal swab, presence of viral mutation(s) within the areas targeted by this assay, and inadequate number of viral copies(<138 copies/mL). A negative result must be combined with clinical observations, patient history, and epidemiological information. The expected result is Negative.  Fact Sheet for Patients:  EntrepreneurPulse.com.au  Fact Sheet for Healthcare Providers:  IncredibleEmployment.be  This test is no t yet approved or cleared by the [place] FDA and  has been authorized for detection and/or diagnosis of SARS-CoV-2 by FDA under an Emergency Use Authorization (EUA). This EUA will remain  in effect (meaning this test can be used) for the duration of the COVID-19 declaration under Section 564(b)(1) of the Act, 21 U.S.C.section 360bbb-3(b)(1), unless the authorization is terminated  or revoked sooner.       Influenza A by PCR NEGATIVE NEGATIVE Final   Influenza B by PCR NEGATIVE NEGATIVE Final    Comment: (NOTE) The Xpert Xpress SARS-CoV-2/FLU/RSV plus assay is intended as an aid in the diagnosis of influenza from Nasopharyngeal swab specimens and should not be used as a sole basis for treatment. Nasal washings and aspirates are  unacceptable for Xpert Xpress SARS-CoV-2/FLU/RSV testing.  Fact Sheet for Patients: EntrepreneurPulse.com.au  Fact Sheet for Healthcare Providers: IncredibleEmployment.be  This test is not yet approved or cleared by the [place] FDA and has been authorized for detection and/or diagnosis of SARS-CoV-2 by FDA under an Emergency Use Authorization (EUA). This EUA will remain in effect (meaning this test can be used) for the duration of the COVID-19 declaration under Section 564(b)(1) of the Act, 21 U.S.C. section 360bbb-3(b)(1), unless the authorization is terminated or revoked.  Performed at [facility], [address], [place], [place] [address]   Surgical PCR screen     Status: None   Collection Time: 09/27/20  5:44 AM   Specimen: Nasal Mucosa; Nasal Swab  Result Value Ref Range Status   MRSA, PCR NEGATIVE NEGATIVE Final   Staphylococcus aureus NEGATIVE NEGATIVE Final    Comment: (NOTE) The Xpert SA Assay (FDA approved for NASAL specimens in patients 21 years of age and older), is one component of a comprehensive surveillance program. It is not intended to diagnose infection nor to guide or monitor treatment. Performed at [facility], [address] [place], [place] [address]      Labs: BNP (last 3 results) No results for input(s): BNP in the last 8760 hours. Basic Metabolic Panel: Recent Labs  Lab 09/27/20 0054 09/27/20 0349 09/28/20 0503 09/29/20 0533  NA 135 136 137 136  K 4.2 4.4 4.3 3.7  CL 103 101 106 107  CO2 23 26 26 26   GLUCOSE 135* 128* 105* 97  BUN 33* 33* 20 17  CREATININE 1.03* 1.01* 0.76 0.69  CALCIUM 8.2* 8.0* 7.9* 7.8*   Liver Function Tests: Recent Labs  Lab 09/27/20 0054  AST 50*  ALT 8  ALKPHOS 68  BILITOT 1.6*  PROT 6.6  ALBUMIN 2.9*   No results for input(s): LIPASE, AMYLASE in the last 168 hours. No results for input(s): AMMONIA in the last 168 hours. CBC: Recent  Labs  Lab 09/27/20 0054 09/27/20 0349 09/28/20 0503 09/28/20 1424 09/29/20 0533  WBC 10.5 9.1 9.5  --  9.6  NEUTROABS 8.2*  --   --   --   --   HGB 7.9* 7.2* 6.9* 7.7* 8.4*  HCT 23.1* 21.2* 20.6* 22.2* 24.7*  MCV 94.7 95.1 95.4  --  95.7  PLT 346 327 376  --  399   Cardiac Enzymes: No results for input(s): CKTOTAL, CKMB, CKMBINDEX, TROPONINI in the last 168 hours. BNP: Invalid input(s): POCBNP CBG: No results for input(s): GLUCAP in the last 168 hours. D-Dimer No results for input(s): DDIMER in the last 72 hours. Hgb A1c No results for input(s): HGBA1C in the last 72 hours. Lipid Profile No results for input(s): CHOL, HDL, LDLCALC, TRIG, CHOLHDL, LDLDIRECT in the last 72 hours. Thyroid function studies No results for input(s): TSH, T4TOTAL, T3FREE, THYROIDAB in the last 72 hours.  Invalid input(s): FREET3 Anemia work up No results for input(s): VITAMINB12, FOLATE, FERRITIN, TIBC, IRON, RETICCTPCT in the last 72 hours. Urinalysis    Component Value Date/Time   COLORURINE YELLOW (A) 09/21/2020 0153   APPEARANCEUR CLEAR (A) 09/21/2020 0153   LABSPEC 1.021 09/21/2020 0153   PHURINE 5.0 09/21/2020 0153   GLUCOSEU NEGATIVE 09/21/2020 0153   HGBUR NEGATIVE 09/21/2020 0153   BILIRUBINUR NEGATIVE 09/21/2020 0153   KETONESUR 5 (A) 09/21/2020 0153   PROTEINUR NEGATIVE 09/21/2020 0153   NITRITE NEGATIVE 09/21/2020 0153   LEUKOCYTESUR SMALL (A) 09/21/2020 0153   Sepsis Labs Invalid input(s): PROCALCITONIN,  WBC,  LACTICIDVEN Microbiology Recent Results (from the past 240 hour(s))  Resp Panel by RT-PCR (Flu A&B, Covid) Nasopharyngeal Swab     Status: None   Collection Time: 09/21/20  1:53 AM   Specimen: Nasopharyngeal Swab; Nasopharyngeal(NP) swabs in vial transport medium  Result Value Ref Range Status   SARS Coronavirus 2 by RT PCR NEGATIVE NEGATIVE Final    Comment: (NOTE) SARS-CoV-2 target nucleic acids are NOT DETECTED.  The SARS-CoV-2 RNA is generally detectable in  upper respiratory specimens during the acute phase of infection. The lowest concentration of SARS-CoV-2 viral copies this assay can detect is 138 copies/mL. A negative result does not preclude SARS-Cov-2 infection and should not be used as the sole basis for treatment or other patient management decisions. A negative result may occur with  improper specimen collection/handling, submission of specimen other than nasopharyngeal swab, presence of viral mutation(s) within the areas targeted by this assay, and inadequate number of viral copies(<138 copies/mL). A negative result must be combined with clinical observations, patient history, and epidemiological information. The expected result is Negative.  Fact Sheet for Patients:  EntrepreneurPulse.com.au  Fact Sheet for Healthcare Providers:  IncredibleEmployment.be  This test is no t yet approved or cleared by the [place] FDA and  has been authorized for detection and/or diagnosis of SARS-CoV-2 by FDA under an Emergency Use Authorization (EUA). This EUA will remain  in effect (meaning this test can be used) for the duration of the COVID-19 declaration under Section 564(b)(1) of the Act, 21 U.S.C.section 360bbb-3(b)(1), unless the authorization is terminated  or revoked sooner.       Influenza A by PCR NEGATIVE NEGATIVE Final   Influenza B by PCR NEGATIVE NEGATIVE Final    Comment: (NOTE) The Xpert Xpress SARS-CoV-2/FLU/RSV plus assay is intended as an aid in the diagnosis of influenza from Nasopharyngeal swab specimens and should not be used as a sole basis for treatment. Nasal washings and aspirates are unacceptable for Xpert Xpress SARS-CoV-2/FLU/RSV testing.  Fact Sheet for Patients: EntrepreneurPulse.com.au  Fact Sheet for Healthcare Providers: IncredibleEmployment.be  This test is not yet approved or cleared by the [place] FDA and has been  authorized for detection and/or diagnosis of SARS-CoV-2 by FDA  under an Emergency Use Authorization (EUA). This EUA will remain in effect (meaning this test can be used) for the duration of the COVID-19 declaration under Section 564(b)(1) of the Act, 21 U.S.C. section 360bbb-3(b)(1), unless the authorization is terminated or revoked.  Performed at [facility], [address], [place], [place] [address]   Surgical PCR screen     Status: None   Collection Time: 09/21/20  7:20 AM   Specimen: Nasal Mucosa; Nasal Swab  Result Value Ref Range Status   MRSA, PCR NEGATIVE NEGATIVE Final   Staphylococcus aureus NEGATIVE NEGATIVE Final    Comment: (NOTE) The Xpert SA Assay (FDA approved for NASAL specimens in patients 52 years of age and older), is one component of a comprehensive surveillance program. It is not intended to diagnose infection nor to guide or monitor treatment. Performed at [facility], [place], [place] [address]   Resp Panel by RT-PCR (Flu A&B, Covid) Nasopharyngeal Swab     Status: None   Collection Time: 09/23/20 11:43 AM   Specimen: Nasopharyngeal Swab; Nasopharyngeal(NP) swabs in vial transport medium  Result Value Ref Range Status   SARS Coronavirus 2 by RT PCR NEGATIVE NEGATIVE Final    Comment: (NOTE) SARS-CoV-2 target nucleic acids are NOT DETECTED.  The SARS-CoV-2 RNA is generally detectable in upper respiratory specimens during the acute phase of infection. The lowest concentration of SARS-CoV-2 viral copies this assay can detect is 138 copies/mL. A negative result does not preclude SARS-Cov-2 infection and should not be used as the sole basis for treatment or other patient management decisions. A negative result may occur with  improper specimen collection/handling, submission of specimen other than nasopharyngeal swab, presence of viral mutation(s) within the areas targeted by this assay, and inadequate number of  viral copies(<138 copies/mL). A negative result must be combined with clinical observations, patient history, and epidemiological information. The expected result is Negative.  Fact Sheet for Patients:  EntrepreneurPulse.com.au  Fact Sheet for Healthcare Providers:  IncredibleEmployment.be  This test is no t yet approved or cleared by the [place] FDA and  has been authorized for detection and/or diagnosis of SARS-CoV-2 by FDA under an Emergency Use Authorization (EUA). This EUA will remain  in effect (meaning this test can be used) for the duration of the COVID-19 declaration under Section 564(b)(1) of the Act, 21 U.S.C.section 360bbb-3(b)(1), unless the authorization is terminated  or revoked sooner.       Influenza A by PCR NEGATIVE NEGATIVE Final   Influenza B by PCR NEGATIVE NEGATIVE Final    Comment: (NOTE) The Xpert Xpress SARS-CoV-2/FLU/RSV plus assay is intended as an aid in the diagnosis of influenza from Nasopharyngeal swab specimens and should not be used as a sole basis for treatment. Nasal washings and aspirates are unacceptable for Xpert Xpress SARS-CoV-2/FLU/RSV testing.  Fact Sheet for Patients: EntrepreneurPulse.com.au  Fact Sheet for Healthcare Providers: IncredibleEmployment.be  This test is not yet approved or cleared by the [place] FDA and has been authorized for detection and/or diagnosis of SARS-CoV-2 by FDA under an Emergency Use Authorization (EUA). This EUA will remain in effect (meaning this test can be used) for the duration of the COVID-19 declaration under Section 564(b)(1) of the Act, 21 U.S.C. section 360bbb-3(b)(1), unless the authorization is terminated or revoked.  Performed at [facility], [address] [place], [place] [address]   Resp Panel by RT-PCR (Flu A&B, Covid) Nasopharyngeal Swab     Status: None   Collection  Time: 09/27/20 12:54  AM   Specimen: Nasopharyngeal Swab; Nasopharyngeal(NP) swabs in vial transport medium  Result Value Ref Range Status   SARS Coronavirus 2 by RT PCR NEGATIVE NEGATIVE Final    Comment: (NOTE) SARS-CoV-2 target nucleic acids are NOT DETECTED.  The SARS-CoV-2 RNA is generally detectable in upper respiratory specimens during the acute phase of infection. The lowest concentration of SARS-CoV-2 viral copies this assay can detect is 138 copies/mL. A negative result does not preclude SARS-Cov-2 infection and should not be used as the sole basis for treatment or other patient management decisions. A negative result may occur with  improper specimen collection/handling, submission of specimen other than nasopharyngeal swab, presence of viral mutation(s) within the areas targeted by this assay, and inadequate number of viral copies(<138 copies/mL). A negative result must be combined with clinical observations, patient history, and epidemiological information. The expected result is Negative.  Fact Sheet for Patients:  EntrepreneurPulse.com.au  Fact Sheet for Healthcare Providers:  IncredibleEmployment.be  This test is no t yet approved or cleared by the [place] FDA and  has been authorized for detection and/or diagnosis of SARS-CoV-2 by FDA under an Emergency Use Authorization (EUA). This EUA will remain  in effect (meaning this test can be used) for the duration of the COVID-19 declaration under Section 564(b)(1) of the Act, 21 U.S.C.section 360bbb-3(b)(1), unless the authorization is terminated  or revoked sooner.       Influenza A by PCR NEGATIVE NEGATIVE Final   Influenza B by PCR NEGATIVE NEGATIVE Final    Comment: (NOTE) The Xpert Xpress SARS-CoV-2/FLU/RSV plus assay is intended as an aid in the diagnosis of influenza from Nasopharyngeal swab specimens and should not be used as a sole basis for treatment. Nasal washings and aspirates are  unacceptable for Xpert Xpress SARS-CoV-2/FLU/RSV testing.  Fact Sheet for Patients: EntrepreneurPulse.com.au  Fact Sheet for Healthcare Providers: IncredibleEmployment.be  This test is not yet approved or cleared by the [place] FDA and has been authorized for detection and/or diagnosis of SARS-CoV-2 by FDA under an Emergency Use Authorization (EUA). This EUA will remain in effect (meaning this test can be used) for the duration of the COVID-19 declaration under Section 564(b)(1) of the Act, 21 U.S.C. section 360bbb-3(b)(1), unless the authorization is terminated or revoked.  Performed at [facility], [address] [place], [place] [address]   Surgical PCR screen     Status: None   Collection Time: 09/27/20  5:44 AM   Specimen: Nasal Mucosa; Nasal Swab  Result Value Ref Range Status   MRSA, PCR NEGATIVE NEGATIVE Final   Staphylococcus aureus NEGATIVE NEGATIVE Final    Comment: (NOTE) The Xpert SA Assay (FDA approved for NASAL specimens in patients 35 years of age and older), is one component of a comprehensive surveillance program. It is not intended to diagnose infection nor to guide or monitor treatment. Performed at [facility], [address] [place], [place] [address]      Time coordinating discharge: 25 minutes The [place] controlled substances registry was reviewed for this patient prior to filling the <5 days supply controlled substances script.    30 Day Unplanned Readmission Risk Score    Flowsheet Row ED to Hosp-Admission (Current) from 09/27/2020 in [facility] (1A)  30 Day Unplanned Readmission Risk Score (%) 14.31 Filed at 09/29/2020 1200       This score is the patient's risk of an unplanned readmission within 30 days of being discharged (0 -100%).  The score is based on dignosis, age, lab data, medications, orders, and past utilization.   Low:  0-14.9   Medium:  15-21.9   High: 22-29.9   Extreme: 30 and above             SIGNED:   Edwin Dada, MD  [facility] 09/29/2020, 3:47 PM

## 2020-09-29 NOTE — Progress Notes (Signed)
Physical Therapy Treatment Patient Details Name: Jasmine Colon MRN: [id number] DOB: 1941-03-20 Today's Date: 09/29/2020    History of Present Illness Pt admitted last week  for L femoral neck fracture s/p hip hemiarthroplasty.  She went to rehab and fell with a dislocation, closed reduction 6/10.  History of Parkinsons disease (takes meds 6xs/day), asthma, and HLD.    PT Comments    Pt did much better today with bed mobility, transfers and ambulation.  She was still somewhat hesitant with WBing but vastly better tolerance than yesterday.  Pt's hip ABd brace in place, education given and adjustments for better alignment by this author.  PT continued with extensive education regarding precautions/offending positions and the importance of pt being aware of these; especially as her significant Parkinsonian tremors very regularly get her into potentially problematic postures (less common that yesterday but had legs crossed/toe in multiple times today literally while we were talking about precautions/positioning).  Pt showed significant functional improvement today and voices desire to go home, but PT still recommending STR at discharge due to safety concerns.    Follow Up Recommendations  SNF     Equipment Recommendations     TBD at next venue of care, has RW  Recommendations for Other Services       Precautions / Restrictions Precautions Precautions: Fall;Posterior Hip Required Braces or Orthoses: Other Brace Other Brace: hip abduction brace Restrictions LLE Weight Bearing: Partial weight bearing    Mobility  Bed Mobility Overal bed mobility: Needs Assistance Bed Mobility: Sit to Supine     Supine to sit: Min assist     General bed mobility comments: Pt was able to get to sitting EOB much better this date.  Held brace with UEs to self assist toward EOB.  Needed only light assist with getting to EOB.    Transfers Overall transfer level: Needs assistance Equipment used:  Rolling walker (2 wheeled) Transfers: Sit to/from Stand Sit to Stand: Min assist         General transfer comment: Pt showed good effort, still hesitant due to pain but did not need excessive assist to get to standing.  Plenty of cuing for UE/AD use  Ambulation/Gait Ambulation/Gait assistance: Min Web designer (Feet): 50 Feet         General Gait Details: Pt clearly much more comfortable today with putting weight through L LE during ambulation/standing.  She still had expected Parkinsonian gait but was nor nearly as reliant on the walker and though she had some unsteadiness 2/2 tremors she did not have any LOBs or overt safety issues.   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   Standing balance support: Bilateral upper extremity supported Standing balance-Leahy Scale: Fair Standing balance comment: Less UE use on RW this date, tremoring t/o the standing effort with no overt LOBs                            Cognition Arousal/Alertness: Awake/alert Behavior During Therapy: WFL for tasks assessed/performed;Restless Overall Cognitive Status: Within Functional Limits for tasks assessed  General Comments: severe dyskinesia throughout session      Exercises      General Comments        Pertinent Vitals/Pain Pain Assessment: 0-10 Pain Score: 4  Pain Location: reports more pain in L thigh/knee than L hip    Home Living                      Prior Function            PT Goals (current goals can now be found in the care plan section) Progress towards PT goals: Progressing toward goals     Frequency    Min 2X/week      PT Plan Current plan remains appropriate    Co-evaluation              AM-PAC PT "6 Clicks" Mobility   Outcome Measure  Help needed turning from your back to your side while in a flat bed without using bedrails?: A Little Help needed moving from lying on your back to sitting on the side of a flat bed without using bedrails?: A Little Help needed moving to and from a bed to a chair (including a wheelchair)?: A Little Help needed standing up from a chair using your arms (e.g., wheelchair or bedside chair)?: A Little Help needed to walk in hospital room?: A Lot Help needed climbing 3-5 steps with a railing? : A Lot 6 Click Score: 16    End of Session Equipment Utilized During Treatment: Gait belt Activity Tolerance: Patient limited by pain Patient left: in chair;with chair alarm set Nurse Communication: Mobility status PT Visit Diagnosis: Muscle weakness (generalized) (M62.81);History of falling (Z91.81);Difficulty in walking, not elsewhere classified (R26.2);Pain Pain - Right/Left: Left Pain - part of body: Hip     Time: 4709-6283 PT Time Calculation (min) (ACUTE ONLY): 25 min  Charges:  $Gait Training: 8-22 mins $Therapeutic Exercise: 8-22 mins                     Kreg Shropshire, DPT 09/29/2020, 1:10 PM

## 2020-09-30 ENCOUNTER — Emergency Department: Payer: PPO

## 2020-09-30 ENCOUNTER — Encounter: Payer: Self-pay | Admitting: Anesthesiology

## 2020-09-30 ENCOUNTER — Encounter
Admission: EM | Disposition: A | Discharge status: Home / Self Care | Payer: Self-pay | Source: Home / Self Care | Attending: Emergency Medicine

## 2020-09-30 ENCOUNTER — Encounter: Payer: Self-pay | Admitting: Emergency Medicine

## 2020-09-30 ENCOUNTER — Other Ambulatory Visit: Payer: Self-pay

## 2020-09-30 ENCOUNTER — Emergency Department
Admission: EM | Admit: 2020-09-30 | Discharge: 2020-09-30 | Disposition: A | Discharge status: Home / Self Care | Payer: PPO | Attending: Emergency Medicine | Admitting: Emergency Medicine

## 2020-09-30 DIAGNOSIS — R918 Other nonspecific abnormal finding of lung field: Secondary | ICD-10-CM | POA: Diagnosis not present

## 2020-09-30 DIAGNOSIS — S73005A Unspecified dislocation of left hip, initial encounter: Secondary | ICD-10-CM | POA: Diagnosis not present

## 2020-09-30 DIAGNOSIS — R5381 Other malaise: Secondary | ICD-10-CM | POA: Diagnosis not present

## 2020-09-30 DIAGNOSIS — J189 Pneumonia, unspecified organism: Secondary | ICD-10-CM | POA: Diagnosis not present

## 2020-09-30 DIAGNOSIS — J45909 Unspecified asthma, uncomplicated: Secondary | ICD-10-CM | POA: Insufficient documentation

## 2020-09-30 DIAGNOSIS — Z96642 Presence of left artificial hip joint: Secondary | ICD-10-CM | POA: Insufficient documentation

## 2020-09-30 DIAGNOSIS — T84021A Dislocation of internal left hip prosthesis, initial encounter: Secondary | ICD-10-CM | POA: Diagnosis not present

## 2020-09-30 DIAGNOSIS — R0602 Shortness of breath: Secondary | ICD-10-CM

## 2020-09-30 DIAGNOSIS — X501XXA Overexertion from prolonged static or awkward postures, initial encounter: Secondary | ICD-10-CM | POA: Insufficient documentation

## 2020-09-30 DIAGNOSIS — Z853 Personal history of malignant neoplasm of breast: Secondary | ICD-10-CM | POA: Diagnosis not present

## 2020-09-30 DIAGNOSIS — Z79899 Other long term (current) drug therapy: Secondary | ICD-10-CM | POA: Diagnosis not present

## 2020-09-30 DIAGNOSIS — G20 Parkinson's disease: Secondary | ICD-10-CM | POA: Diagnosis not present

## 2020-09-30 DIAGNOSIS — I959 Hypotension, unspecified: Secondary | ICD-10-CM | POA: Diagnosis not present

## 2020-09-30 DIAGNOSIS — Z7982 Long term (current) use of aspirin: Secondary | ICD-10-CM | POA: Diagnosis not present

## 2020-09-30 DIAGNOSIS — S73015A Posterior dislocation of left hip, initial encounter: Secondary | ICD-10-CM | POA: Diagnosis not present

## 2020-09-30 DIAGNOSIS — M25559 Pain in unspecified hip: Secondary | ICD-10-CM | POA: Diagnosis not present

## 2020-09-30 DIAGNOSIS — F17210 Nicotine dependence, cigarettes, uncomplicated: Secondary | ICD-10-CM | POA: Diagnosis not present

## 2020-09-30 DIAGNOSIS — Z20822 Contact with and (suspected) exposure to covid-19: Secondary | ICD-10-CM | POA: Diagnosis not present

## 2020-09-30 DIAGNOSIS — Z471 Aftercare following joint replacement surgery: Secondary | ICD-10-CM | POA: Diagnosis not present

## 2020-09-30 DIAGNOSIS — Y92193 Bedroom in other specified residential institution as the place of occurrence of the external cause: Secondary | ICD-10-CM | POA: Insufficient documentation

## 2020-09-30 DIAGNOSIS — Y9389 Activity, other specified: Secondary | ICD-10-CM | POA: Insufficient documentation

## 2020-09-30 DIAGNOSIS — Q7292 Unspecified reduction defect of left lower limb: Secondary | ICD-10-CM

## 2020-09-30 LAB — CBC WITH DIFFERENTIAL/PLATELET
Abs Immature Granulocytes: 0.36 10*3/uL — ABNORMAL HIGH (ref 0.00–0.07)
Basophils Absolute: 0 10*3/uL (ref 0.0–0.1)
Basophils Relative: 0 %
Eosinophils Absolute: 0.2 10*3/uL (ref 0.0–0.5)
Eosinophils Relative: 2 %
HCT: 28.3 % — ABNORMAL LOW (ref 36.0–46.0)
Hemoglobin: 9.5 g/dL — ABNORMAL LOW (ref 12.0–15.0)
Immature Granulocytes: 3 %
Lymphocytes Relative: 17 %
Lymphs Abs: 2.1 10*3/uL (ref 0.7–4.0)
MCH: 33.1 pg (ref 26.0–34.0)
MCHC: 33.6 g/dL (ref 30.0–36.0)
MCV: 98.6 fL (ref 80.0–100.0)
Monocytes Absolute: 1 10*3/uL (ref 0.1–1.0)
Monocytes Relative: 8 %
Neutro Abs: 8.5 10*3/uL — ABNORMAL HIGH (ref 1.7–7.7)
Neutrophils Relative %: 70 %
Platelets: 461 10*3/uL — ABNORMAL HIGH (ref 150–400)
RBC: 2.87 MIL/uL — ABNORMAL LOW (ref 3.87–5.11)
RDW: 15.5 % (ref 11.5–15.5)
WBC: 12.2 10*3/uL — ABNORMAL HIGH (ref 4.0–10.5)
nRBC: 0.2 % (ref 0.0–0.2)

## 2020-09-30 LAB — BASIC METABOLIC PANEL
Anion gap: 8 (ref 5–15)
BUN: 19 mg/dL (ref 8–23)
CO2: 22 mmol/L (ref 22–32)
Calcium: 8.3 mg/dL — ABNORMAL LOW (ref 8.9–10.3)
Chloride: 106 mmol/L (ref 98–111)
Creatinine, Ser: 0.71 mg/dL (ref 0.44–1.00)
GFR, Estimated: >60 mL/min (ref >60)
Glucose, Bld: 88 mg/dL (ref 70–99)
Potassium: 4.8 mmol/L (ref 3.5–5.1)
Sodium: 136 mmol/L (ref 135–145)

## 2020-09-30 LAB — RESP PANEL BY RT-PCR (FLU A&B, COVID) ARPGX2
Influenza A by PCR: NEGATIVE
Influenza B by PCR: NEGATIVE
SARS Coronavirus 2 by RT PCR: NEGATIVE

## 2020-09-30 LAB — PROCALCITONIN: Procalcitonin: 0.11 ng/mL

## 2020-09-30 SURGERY — CLOSED REDUCTION, HIP
Anesthesia: Choice | Site: Hip | Laterality: Left

## 2020-09-30 MED ORDER — PROPOFOL 10 MG/ML IV BOLUS
20.0000 mg | Freq: Once | INTRAVENOUS | Status: AC
  Administered 2020-09-30: 20 mg via INTRAVENOUS
  Filled 2020-09-30: qty 20

## 2020-09-30 MED ORDER — SODIUM CHLORIDE 0.9 % IV BOLUS: 500.0000 mL | Freq: Once | INTRAVENOUS | Status: DC

## 2020-09-30 MED ORDER — PROPOFOL 10 MG/ML IV BOLUS
INTRAVENOUS | Status: AC | PRN
  Administered 2020-09-30: 20 mg via INTRAVENOUS

## 2020-09-30 MED ORDER — KETAMINE HCL 10 MG/ML IJ SOLN
20.0000 mg | Freq: Once | INTRAMUSCULAR | Status: AC
  Administered 2020-09-30: 20 mg via INTRAVENOUS
  Filled 2020-09-30: qty 1

## 2020-09-30 SURGICAL SUPPLY — 2 items
KIT TURNOVER KIT A (KITS) ×3 IMPLANT
MANIFOLD NEPTUNE II (INSTRUMENTS) ×3 IMPLANT

## 2020-09-30 NOTE — ED Notes (Signed)
Pt given meal tray as she waits for EMS to take her back to [facility].

## 2020-09-30 NOTE — Discharge Instructions (Addendum)
You need to leave the hip brace on at all times.  You need to call the orthopedic office to schedule a follow-up.  They are going to need to schedule you for a surgery to correct this she can stop having dislocation  There were concerned about a possible pneumonia on your x-ray but you had no cough, shortness of breath and oxygen levels were around her baseline 90 to 94%.  However if she develop fevers or cough she should return to the ER or discussed with the doctor.  Return to the ER for recurrent dislocation.

## 2020-09-30 NOTE — ED Provider Notes (Signed)
[facility] Emergency Department Provider Note  ____________________________________________   Event Date/Time   First MD Initiated Contact with Patient 09/30/20 1116     (approximate)  I have reviewed the triage vital signs and the nursing notes.   HISTORY  Chief Complaint Hip Pain    HPI Jasmine Colon is a 80 y.o. female who comes in from EMS from [facility] status post rolling around in bed with left hip pain.  Patient had a left hip hemiarthroplasty for displaced subcapital left hip fracture on 09/21/2020.  Patient then returned to the ER on 6/10 for a dislocation that had to be taken to the OR.  Patient now returns again after being discharged yesterday for continued left hip pain.  She states that she was rolling around in bed and she feels like it popped out again.  She states that she has pain in that hip mostly with movement, better with rest, mild.  During her last admission she was also noted to be anemic.  She does have a history of Parkinson's and has dystonic movements from that.  She states that these are's may be slightly worse at this time but she states that just because of anxiety.  She did not fall out of bed and did not hit her head or lose consciousness.          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 Hormone positive, HER 2 no amplified   Mitral valve disorder    Osteoarthritis    Osteoporosis    Parkinson's disease ([place]) 2012   Personal history of radiation therapy 2013   right breast ca with lumpectomy and rad tx    Patient Active Problem List   Diagnosis Date Noted   Dislocated hip ([place]) 09/28/2020   Dislocation of hip prosthesis, initial encounter ([place]) 09/27/2020   Dislocation of hip joint prosthesis, initial encounter ([place]) 09/27/2020    Postoperative anemia 09/27/2020   AKI (acute kidney injury) ([place]) 09/27/2020   Malnutrition of moderate degree 09/27/2020   Accidental fall    Hyperlipidemia    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 Jamal Collin. Benign   BREAST LUMPECTOMY Right 11/13/2011   invasive mammary ca, DCIS, LCIS in specimen. Clear margins.  LN negative   BREAST SURGERY Right 2013   wide excision   CESAREAN SECTION  31 years ago   COLONOSCOPY  2008   HIP ARTHROPLASTY Left 09/21/2020   Procedure: ARTHROPLASTY BIPOLAR HIP (HEMIARTHROPLASTY);  Surgeon: Earnestine Leys, MD;  Location: [facility] ORS;  Service: Orthopedics;  Laterality: Left;   HIP CLOSED REDUCTION Left 09/27/2020   Procedure: CLOSED REDUCTION HIP;  Surgeon: Renee Harder, MD;  Location: [facility] ORS;  Service: Orthopedics;  Laterality: Left;    Prior to Admission medications   Medication Sig Start Date End Date Taking? Authorizing Provider  acetaminophen (TYLENOL) 500 MG tablet Take 2 tablets (1,000 mg total) by mouth 3 (three) times daily for 5 days. 09/29/20 10/04/20  Danford, Suann Larry, MD  aspirin EC 325 MG tablet Take 1 tablet (325 mg total) by mouth 2 (two) times daily for 14 days. 09/29/20 10/13/20  DanfordSuann Larry, MD  Calcium Carbonate-Vitamin D (CALTRATE 600+D PO) Take 3 tablets by mouth 3 (three) times daily.     [provider]  carbidopa-levodopa (SINEMET IR)  25-100 MG tablet Take 1 tablet by mouth 6 (six) times daily. 09/24/20   Fritzi Mandes, MD  cyanocobalamin 1000 MCG tablet Take 100 mcg by mouth daily.    [provider]  docusate sodium (COLACE) 100 MG capsule Take 1 capsule (100 mg total) by mouth 2 (two) times daily. 09/24/20   Fritzi Mandes, MD  feeding supplement (ENSURE ENLIVE / ENSURE PLUS) LIQD Take 237 mLs by mouth 3 (three) times daily between meals. 09/29/20   Danford, Suann Larry, MD  ferrous sulfate 325 (65 FE) MG tablet Take 1 tablet (325 mg total) by mouth daily with breakfast. 09/25/20   Fritzi Mandes, MD  fluticasone [facility]) 50 MCG/ACT nasal spray Place 2 sprays into both nostrils daily as needed for allergies or rhinitis.    [provider]  Ibuprofen 200 MG CAPS Take 400 mg by mouth every 8 (eight) hours as needed.    [provider]  loratadine (CLARITIN) 10 MG tablet Take 10 mg by mouth daily as needed for allergies.    [provider]  mirtazapine (REMERON) 30 MG tablet Take 30 mg by mouth at bedtime. 06/27/20   [provider]  Multiple Vitamins-Minerals (PRESERVISION AREDS PO) Take by mouth daily.    [provider]  oxyCODONE (OXY IR/ROXICODONE) 5 MG immediate release tablet Take 1 tablet (5 mg total) by mouth every 4 (four) hours as needed for moderate pain. 09/29/20   Danford, Suann Larry, MD  rasagiline (AZILECT) 1 MG TABS tablet Take 1 tablet (1 mg total) by mouth daily. 09/25/20   Fritzi Mandes, MD  sodium chloride (OCEAN) 0.65 % SOLN nasal spray Place 1 spray into both nostrils as needed for congestion.    [provider]    Allergies Codeine and Pseudoephedrine  Family History  Problem Relation Age of Onset   Stroke Mother    Heart failure Father    Heart disease Brother        bypass   Cancer Maternal Aunt        breast    Breast cancer Maternal Aunt    Cancer Maternal Aunt        breast   Breast cancer Maternal Aunt     Social History Social History    Tobacco Use   Smoking status: Every Day    Packs/day: 0.50    Years: 45.00    Pack years: 22.50    Types: Cigarettes   Smokeless tobacco: Never  Substance Use Topics   Alcohol use: No   Drug use: No      Review of Systems Constitutional: No fever/chills Eyes: No visual changes. ENT: No sore throat. Cardiovascular: Denies chest pain. Respiratory: Denies shortness of breath. Gastrointestinal: No abdominal pain.  No nausea, no vomiting.  No diarrhea.  No constipation. Genitourinary: Negative for dysuria. Musculoskeletal: Negative for back pain.  Left hip pain Skin:  Negative for rash. Neurological: Negative for headaches, focal weakness or numbness. All other ROS negative ____________________________________________   PHYSICAL EXAM:  VITAL SIGNS: ED Triage Vitals  Enc Vitals Group     BP 09/30/20 1121 95/62     Pulse Rate 09/30/20 1121 90     Resp 09/30/20 1121 20     Temp 09/30/20 1121 97.9 F (36.6 C)     Temp Source 09/30/20 1121 Oral     SpO2 09/30/20 1121 93 %     Weight 09/30/20 1122 110 lb 0.2 oz (49.9 kg)     Height 09/30/20 1122 5\' 5"  (1.651 m)     Head Circumference --      Peak Flow --      Pain Score 09/30/20 1122 0     Pain Loc --      Pain Edu? --      Excl. in [place]? --     Constitutional: Alert and oriented.  Dystonic movements noted Eyes: Conjunctivae are normal. EOMI. Head: Atraumatic. Nose: No congestion/rhinnorhea. Mouth/Throat: Mucous membranes are moist.   Neck: No stridor. Trachea Midline. FROM Cardiovascular: Normal rate, regular rhythm. Grossly normal heart sounds.  Good peripheral circulation. Respiratory: Normal respiratory effort.  No retractions. Lungs CTAB. Gastrointestinal: Soft and nontender. No distention. No abdominal bruits.  Musculoskeletal: Unable to lift the left leg up off the bed secondary to pain.  2+ distal pulse.Marland Kitchen  No joint effusions. Post op baindage on leg. Bruising noted. Slightly swollen compared to right   Neurologic:  Normal speech and language. No gross focal neurologic deficits are appreciated.  Skin:  Skin is warm, dry and intact. No rash noted. Psychiatric: Mood and affect are normal. Speech and behavior are normal. GU: Deferred   ____________________________________________   LABS (all labs ordered are listed, but only abnormal results are displayed)  Labs Reviewed  CBC WITH DIFFERENTIAL/PLATELET - Abnormal; Notable for the following components:      Result Value   WBC 12.2 (*)    RBC 2.87 (*)    Hemoglobin 9.5 (*)    HCT 28.3 (*)    Platelets 461 (*)    Neutro Abs 8.5 (*)    Abs Immature Granulocytes 0.36 (*)    All other components within normal limits  BASIC METABOLIC PANEL - Abnormal; Notable for the following components:   Calcium 8.3 (*)    All other components within normal limits  RESP PANEL BY RT-PCR (FLU A&B, COVID) [id number]   ____________________________________________  RADIOLOGY Robert Bellow, personally viewed and evaluated these images (plain radiographs) as part of my medical decision making, as well as reviewing the written report by the radiologist.  ED MD interpretation: Dislocated left hip.  Official radiology report(s): DG Chest 1 View  Result Date: 09/30/2020 CLINICAL DATA:  Hip dislocation EXAM: CHEST  1 VIEW COMPARISON:  September 27, 2020 FINDINGS: Small area of airspace opacity lateral to the inferior right hilum. Lungs elsewhere clear. Heart size and pulmonary vascular normal. There is aortic atherosclerosis. There is scoliosis. There is arthropathy in the right shoulder. IMPRESSION: Suspect small focus of pneumonia right lower lobe lateral to the inferior right hilum. Lungs otherwise clear. Heart size normal. Aortic Atherosclerosis (ICD10-I70.0). Electronically Signed   By: Lowella Grip III M.D.   On: 09/30/2020 12:07   DG Hip Unilat W or Wo Pelvis 2-3 Views Left  Result Date: 09/30/2020 CLINICAL DATA:  Pain EXAM: DG HIP (WITH OR WITHOUT  PELVIS) 2-3V LEFT COMPARISON:  September 21, 2020  FINDINGS: Frontal pelvis as well as frontal and lateral left hip images were obtained. There is a total hip replacement on the left. There is dislocation at the left hip joint with the left femur located lateral and posterior to the acetabulum. No fracture evident. There is degenerative change in the lower lumbar spine with levoscoliosis. Sacroiliac joints appear normal bilaterally. IMPRESSION: Total hip replacement on the left with dislocation. The left femur is displaced superiorly and posteriorly with respect to the acetabulum. No fracture. Degenerative change and scoliosis in lumbar spine. Electronically Signed   By: Lowella Grip III M.D.   On: 09/30/2020 12:06    ____________________________________________   PROCEDURES  Procedure(s) performed (including Critical Care):  .Sedation  Date/Time: 09/30/2020 3:22 PM Performed by: Vanessa Shiprock, MD Authorized by: Vanessa Harvel, MD   Consent:    Consent obtained:  Verbal and written   Consent given by:  Patient   Risks discussed:  Allergic reaction, dysrhythmia, inadequate sedation, nausea, vomiting, respiratory compromise necessitating ventilatory assistance and intubation, prolonged sedation necessitating reversal and prolonged hypoxia resulting in organ damage   Alternatives discussed:  Analgesia without sedation Universal protocol:    Immediately prior to procedure, a time out was called: yes     Patient identity confirmed:  Arm band and verbally with patient Indications:    Procedure performed:  Dislocation reduction   Procedure necessitating sedation performed by:  Different physician Pre-sedation assessment:    Time since last food or drink:  12   ASA classification: class 2 - patient with mild systemic disease     Mouth opening:  2 finger widths   Mallampati score:  III - soft palate, base of uvula visible   Neck mobility: normal     Pre-sedation assessments completed and reviewed:  airway patency     Pre-sedation assessment completed:  09/30/2020 12:23 PM Immediate pre-procedure details:    Reviewed: vital signs, relevant labs/tests and NPO status     Verified: bag valve mask available, emergency equipment available, intubation equipment available, IV patency confirmed, oxygen available, reversal medications available and suction available   Procedure details (see MAR for exact dosages):    Preoxygenation:  Nasal cannula   Sedation:  Propofol and ketamine   Intended level of sedation: deep   Intra-procedure monitoring:  Blood pressure monitoring, cardiac monitor, continuous pulse oximetry, continuous capnometry, frequent LOC assessments and frequent vital sign checks   Intra-procedure events: none     Total Provider sedation time (minutes):  15 Post-procedure details:    Post-sedation assessment completed:  09/30/2020 1:45 PM   Attendance: Constant attendance by certified staff until patient recovered     Recovery: Patient returned to pre-procedure baseline     Patient is stable for discharge or admission: yes     Procedure completion:  Tolerated well, no immediate complications   ____________________________________________   INITIAL IMPRESSION / ASSESSMENT AND PLAN / ED COURSE  Jasmine Colon was evaluated in Emergency Department on 09/30/2020 for the symptoms described in the history of present illness. She was evaluated in the context of the global COVID-19 pandemic, which necessitated consideration that the patient might be at risk for infection with the SARS-CoV-2 virus that causes COVID-19. Institutional protocols and algorithms that pertain to the evaluation of patients at risk for COVID-19 are in a state of rapid change based on information released by regulatory bodies including the CDC and federal and state organizations. These policies and algorithms were followed during the patient's care in the  ED.    Patient is a 80 year old who comes in with left hip  pain and concerned that she might of dislocated it again while in bed.  Patient's oxygen levels are 93% and blood pressures are slightly low at 95/62 but I reviewed her prior records and while she was in the hospital it seems like this is what she was running.  We will get x-ray to evaluate for dislocation, less likely fracture.  Currently vascularly intact.  Will get chest x-ray for preop reasons.  We will also get repeat hemoglobin given concerns for anemia last admission  D/w Dr. Mack Guise who recommends [place] trying to relocate it otherwise he cant do it till tomorrow.  We will need to follow-up outpatient to schedule a revision surgery due to frequent dislocations.  Discussed with patient and her son and consented them for sedation and reduction.  Chest x-ray incidentally came back with possible small pneumonia.  Discussed with patient and family at bedside and she has not had any shortness of breath, coughing, fevers or any symptoms of pneumonia.  Her baseline oxygen levels are between 90 to 94% secondary to history of years of smoking.  She is okay with proceeding with the sedation and reduction which I think is reasonable given my very low suspicion for pneumonia.  Her procalcitonin is negative therefore will not start any antibiotics.  I did discuss return precautions in regards to this.  Patient was sedated and reduced.  X-ray shows hip is in place.  Patient was placed back into her hip immobilizer  2:36 PM patient is able to move her toes and has good distal pulse.  Patient was comfortable with discharge back to facility.  She understands that she needs to follow-up with the Ortho clinic to schedule an outpatient revision surgery   I discussed the provisional nature of ED diagnosis, the treatment so far, the ongoing plan of care, follow up appointments and return precautions with the patient and any family or support people present. They expressed understanding and agreed with the plan,  discharged home.     ____________________________________________   FINAL CLINICAL IMPRESSION(S) / ED DIAGNOSES   Final diagnoses:  Dislocation of left hip, initial encounter ([place])      MEDICATIONS GIVEN DURING THIS VISIT:  Medications  sodium chloride 0.9 % bolus 500 mL (has no administration in time range)  ketamine (KETALAR) injection 20 mg (20 mg Intravenous Given 09/30/20 1319)  propofol (DIPRIVAN) 10 mg/mL bolus/IV push 20 mg (20 mg Intravenous Given 09/30/20 1320)  propofol (DIPRIVAN) 10 mg/mL bolus/IV push (20 mg Intravenous Given 09/30/20 1322)     ED Discharge Orders     None        Note:  This document was prepared using Dragon voice recognition software and may include unintentional dictation errors.    Vanessa [place], MD 09/30/20 1525

## 2020-09-30 NOTE — ED Triage Notes (Signed)
Pt via EMS from [facility]. Pt has a hx of L hip replacement in May, pt states she also had a dislocation on Friday. Denies fall, states she was adjusting herself in the bed and heard her hip pop. Denies pain at this time. Obvious internal rotation at this time. Pt has a hx of Parkinsons, pt is A&Ox4 and NAD.

## 2020-09-30 NOTE — ED Provider Notes (Addendum)
.  Ortho Injury Treatment  Date/Time: 09/30/2020 1:27 PM Performed by: Lavonia Drafts, MD Authorized by: Lavonia Drafts, MD   Consent:    Consent obtained:  Written   Consent given by:  Patient   Risks discussed:  Fracture, nerve damage, vascular damage, irreducible dislocation and recurrent dislocationInjury location: hip Location details: left hip Injury type: dislocation Dislocation type: posterior Spontaneous dislocation: no Pre-procedure neurovascular assessment: neurovascularly intact Pre-procedure distal perfusion: normal Pre-procedure neurological function: normal Pre-procedure range of motion: normal  Patient sedated: Yes. Refer to sedation procedure documentation for details of sedation. Manipulation performed: yes Reduction method: Allis maneuver Reduction successful: yes X-ray confirmed reduction: yes Immobilization: brace Splint Applied by: ED Nurse Post-procedure neurovascular assessment: post-procedure neurovascularly intact Post-procedure distal perfusion: normal Post-procedure neurological function: normal Post-procedure range of motion: normal      Lavonia Drafts, MD 09/30/20 1327    Lavonia Drafts, MD 10/04/20 [phone]

## 2020-09-30 NOTE — ED Notes (Signed)
Called [facility] for transport to [facility] [phone]

## 2020-10-03 ENCOUNTER — Inpatient Hospital Stay
Admission: EM | Admit: 2020-10-03 | Discharge: 2020-10-11 | DRG: 467 | Disposition: A | Discharge status: Home Health | Payer: PPO | Source: Skilled Nursing Facility | Attending: Internal Medicine | Admitting: Internal Medicine

## 2020-10-03 ENCOUNTER — Emergency Department: Payer: PPO

## 2020-10-03 ENCOUNTER — Other Ambulatory Visit: Payer: Self-pay

## 2020-10-03 DIAGNOSIS — M25552 Pain in left hip: Secondary | ICD-10-CM | POA: Diagnosis not present

## 2020-10-03 DIAGNOSIS — Y831 Surgical operation with implant of artificial internal device as the cause of abnormal reaction of the patient, or of later complication, without mention of misadventure at the time of the procedure: Secondary | ICD-10-CM | POA: Diagnosis present

## 2020-10-03 DIAGNOSIS — Z7982 Long term (current) use of aspirin: Secondary | ICD-10-CM

## 2020-10-03 DIAGNOSIS — F32A Depression, unspecified: Secondary | ICD-10-CM | POA: Diagnosis present

## 2020-10-03 DIAGNOSIS — S3739XA Other injury of urethra, initial encounter: Secondary | ICD-10-CM | POA: Diagnosis not present

## 2020-10-03 DIAGNOSIS — E440 Moderate protein-calorie malnutrition: Secondary | ICD-10-CM | POA: Diagnosis present

## 2020-10-03 DIAGNOSIS — Z8249 Family history of ischemic heart disease and other diseases of the circulatory system: Secondary | ICD-10-CM | POA: Diagnosis not present

## 2020-10-03 DIAGNOSIS — Z4789 Encounter for other orthopedic aftercare: Secondary | ICD-10-CM | POA: Diagnosis not present

## 2020-10-03 DIAGNOSIS — R4189 Other symptoms and signs involving cognitive functions and awareness: Secondary | ICD-10-CM | POA: Diagnosis present

## 2020-10-03 DIAGNOSIS — Z20822 Contact with and (suspected) exposure to covid-19: Secondary | ICD-10-CM | POA: Diagnosis present

## 2020-10-03 DIAGNOSIS — D62 Acute posthemorrhagic anemia: Secondary | ICD-10-CM | POA: Diagnosis not present

## 2020-10-03 DIAGNOSIS — S73015A Posterior dislocation of left hip, initial encounter: Secondary | ICD-10-CM

## 2020-10-03 DIAGNOSIS — K5900 Constipation, unspecified: Secondary | ICD-10-CM | POA: Diagnosis present

## 2020-10-03 DIAGNOSIS — M6281 Muscle weakness (generalized): Secondary | ICD-10-CM | POA: Diagnosis not present

## 2020-10-03 DIAGNOSIS — R5381 Other malaise: Secondary | ICD-10-CM | POA: Diagnosis not present

## 2020-10-03 DIAGNOSIS — S73005A Unspecified dislocation of left hip, initial encounter: Secondary | ICD-10-CM | POA: Diagnosis present

## 2020-10-03 DIAGNOSIS — Z419 Encounter for procedure for purposes other than remedying health state, unspecified: Secondary | ICD-10-CM

## 2020-10-03 DIAGNOSIS — T84021D Dislocation of internal left hip prosthesis, subsequent encounter: Secondary | ICD-10-CM | POA: Diagnosis not present

## 2020-10-03 DIAGNOSIS — Z823 Family history of stroke: Secondary | ICD-10-CM

## 2020-10-03 DIAGNOSIS — R279 Unspecified lack of coordination: Secondary | ICD-10-CM | POA: Diagnosis not present

## 2020-10-03 DIAGNOSIS — G249 Dystonia, unspecified: Secondary | ICD-10-CM | POA: Diagnosis present

## 2020-10-03 DIAGNOSIS — M810 Age-related osteoporosis without current pathological fracture: Secondary | ICD-10-CM | POA: Diagnosis present

## 2020-10-03 DIAGNOSIS — J309 Allergic rhinitis, unspecified: Secondary | ICD-10-CM | POA: Diagnosis not present

## 2020-10-03 DIAGNOSIS — F17210 Nicotine dependence, cigarettes, uncomplicated: Secondary | ICD-10-CM | POA: Diagnosis present

## 2020-10-03 DIAGNOSIS — S32492A Other specified fracture of left acetabulum, initial encounter for closed fracture: Secondary | ICD-10-CM | POA: Diagnosis not present

## 2020-10-03 DIAGNOSIS — S73005D Unspecified dislocation of left hip, subsequent encounter: Secondary | ICD-10-CM | POA: Diagnosis not present

## 2020-10-03 DIAGNOSIS — I959 Hypotension, unspecified: Secondary | ICD-10-CM | POA: Diagnosis present

## 2020-10-03 DIAGNOSIS — S73002A Unspecified subluxation of left hip, initial encounter: Secondary | ICD-10-CM | POA: Diagnosis not present

## 2020-10-03 DIAGNOSIS — G20 Parkinson's disease: Secondary | ICD-10-CM | POA: Diagnosis present

## 2020-10-03 DIAGNOSIS — E785 Hyperlipidemia, unspecified: Secondary | ICD-10-CM | POA: Diagnosis not present

## 2020-10-03 DIAGNOSIS — F05 Delirium due to known physiological condition: Secondary | ICD-10-CM | POA: Diagnosis not present

## 2020-10-03 DIAGNOSIS — M159 Polyosteoarthritis, unspecified: Secondary | ICD-10-CM | POA: Diagnosis not present

## 2020-10-03 DIAGNOSIS — Z681 Body mass index (BMI) 19 or less, adult: Secondary | ICD-10-CM | POA: Diagnosis not present

## 2020-10-03 DIAGNOSIS — R52 Pain, unspecified: Secondary | ICD-10-CM | POA: Diagnosis not present

## 2020-10-03 DIAGNOSIS — T84021A Dislocation of internal left hip prosthesis, initial encounter: Secondary | ICD-10-CM | POA: Diagnosis present

## 2020-10-03 DIAGNOSIS — Z471 Aftercare following joint replacement surgery: Secondary | ICD-10-CM | POA: Diagnosis not present

## 2020-10-03 DIAGNOSIS — Z923 Personal history of irradiation: Secondary | ICD-10-CM | POA: Diagnosis not present

## 2020-10-03 DIAGNOSIS — S73006A Unspecified dislocation of unspecified hip, initial encounter: Secondary | ICD-10-CM | POA: Diagnosis not present

## 2020-10-03 DIAGNOSIS — Z853 Personal history of malignant neoplasm of breast: Secondary | ICD-10-CM

## 2020-10-03 DIAGNOSIS — S72002A Fracture of unspecified part of neck of left femur, initial encounter for closed fracture: Secondary | ICD-10-CM | POA: Diagnosis not present

## 2020-10-03 DIAGNOSIS — Z96642 Presence of left artificial hip joint: Secondary | ICD-10-CM

## 2020-10-03 DIAGNOSIS — M24452 Recurrent dislocation, left hip: Secondary | ICD-10-CM | POA: Diagnosis not present

## 2020-10-03 LAB — CBC WITH DIFFERENTIAL/PLATELET
Abs Immature Granulocytes: 0.1 10*3/uL — ABNORMAL HIGH (ref 0.00–0.07)
Basophils Absolute: 0 10*3/uL (ref 0.0–0.1)
Basophils Relative: 0 %
Eosinophils Absolute: 0.3 10*3/uL (ref 0.0–0.5)
Eosinophils Relative: 2 %
HCT: 28.5 % — ABNORMAL LOW (ref 36.0–46.0)
Hemoglobin: 9.4 g/dL — ABNORMAL LOW (ref 12.0–15.0)
Immature Granulocytes: 1 %
Lymphocytes Relative: 19 %
Lymphs Abs: 1.9 10*3/uL (ref 0.7–4.0)
MCH: 32.4 pg (ref 26.0–34.0)
MCHC: 33 g/dL (ref 30.0–36.0)
MCV: 98.3 fL (ref 80.0–100.0)
Monocytes Absolute: 0.8 10*3/uL (ref 0.1–1.0)
Monocytes Relative: 7 %
Neutro Abs: 7.2 10*3/uL (ref 1.7–7.7)
Neutrophils Relative %: 71 %
Platelets: 585 10*3/uL — ABNORMAL HIGH (ref 150–400)
RBC: 2.9 MIL/uL — ABNORMAL LOW (ref 3.87–5.11)
RDW: 15.2 % (ref 11.5–15.5)
WBC: 10.2 10*3/uL (ref 4.0–10.5)
nRBC: 0 % (ref 0.0–0.2)

## 2020-10-03 LAB — BASIC METABOLIC PANEL
Anion gap: 7 (ref 5–15)
BUN: 13 mg/dL (ref 8–23)
CO2: 24 mmol/L (ref 22–32)
Calcium: 8.4 mg/dL — ABNORMAL LOW (ref 8.9–10.3)
Chloride: 104 mmol/L (ref 98–111)
Creatinine, Ser: 0.61 mg/dL (ref 0.44–1.00)
GFR, Estimated: >60 mL/min (ref >60)
Glucose, Bld: 97 mg/dL (ref 70–99)
Potassium: 4.1 mmol/L (ref 3.5–5.1)
Sodium: 135 mmol/L (ref 135–145)

## 2020-10-03 MED ORDER — FERROUS SULFATE 325 (65 FE) MG PO TABS
325.0000 mg | ORAL_TABLET | Freq: Every day | ORAL | Status: DC
  Administered 2020-10-04 – 2020-10-06 (×3): 325 mg via ORAL
  Filled 2020-10-03 (×4): qty 1

## 2020-10-03 MED ORDER — HYDROCODONE-ACETAMINOPHEN 5-325 MG PO TABS
1.0000 | ORAL_TABLET | Freq: Four times a day (QID) | ORAL | Status: DC | PRN
  Administered 2020-10-04 – 2020-10-11 (×10): 1 via ORAL
  Filled 2020-10-03 (×11): qty 1

## 2020-10-03 MED ORDER — ENSURE ENLIVE PO LIQD
237.0000 mL | Freq: Three times a day (TID) | ORAL | Status: DC
  Administered 2020-10-05 – 2020-10-11 (×15): 237 mL via ORAL

## 2020-10-03 MED ORDER — LORATADINE 10 MG PO TABS: 10.0000 mg | ORAL_TABLET | Freq: Every day | ORAL | Status: DC | PRN

## 2020-10-03 MED ORDER — MIRTAZAPINE 15 MG PO TABS
30.0000 mg | ORAL_TABLET | Freq: Every day | ORAL | Status: DC
  Administered 2020-10-04 – 2020-10-10 (×7): 30 mg via ORAL
  Filled 2020-10-03 (×7): qty 2

## 2020-10-03 MED ORDER — ONDANSETRON HCL 4 MG PO TABS: 4.0000 mg | ORAL_TABLET | Freq: Four times a day (QID) | ORAL | Status: DC | PRN

## 2020-10-03 MED ORDER — ACETAMINOPHEN 325 MG PO TABS
650.0000 mg | ORAL_TABLET | Freq: Three times a day (TID) | ORAL | Status: DC | PRN
  Administered 2020-10-04 – 2020-10-09 (×2): 650 mg via ORAL
  Filled 2020-10-03 (×2): qty 2

## 2020-10-03 MED ORDER — OCUVITE-LUTEIN PO CAPS
ORAL_CAPSULE | Freq: Every day | ORAL | Status: DC
  Administered 2020-10-04 – 2020-10-11 (×7): 1 via ORAL
  Filled 2020-10-03 (×8): qty 1

## 2020-10-03 MED ORDER — FLUTICASONE PROPIONATE 50 MCG/ACT NA SUSP
2.0000 | Freq: Every day | NASAL | Status: DC | PRN
  Administered 2020-10-05: 2 via NASAL
  Filled 2020-10-03 (×2): qty 16

## 2020-10-03 MED ORDER — SODIUM CHLORIDE 0.9% FLUSH
3.0000 mL | INTRAVENOUS | Status: DC | PRN
  Administered 2020-10-05 – 2020-10-07 (×2): 3 mL via INTRAVENOUS

## 2020-10-03 MED ORDER — SODIUM CHLORIDE 0.9% FLUSH
3.0000 mL | Freq: Two times a day (BID) | INTRAVENOUS | Status: DC
  Administered 2020-10-04 – 2020-10-11 (×13): 3 mL via INTRAVENOUS

## 2020-10-03 MED ORDER — ONDANSETRON HCL 4 MG/2ML IJ SOLN: 4.0000 mg | Freq: Four times a day (QID) | INTRAMUSCULAR | Status: DC | PRN

## 2020-10-03 MED ORDER — ONDANSETRON HCL 4 MG/2ML IJ SOLN
4.0000 mg | Freq: Once | INTRAMUSCULAR | Status: AC
  Administered 2020-10-03: 4 mg via INTRAVENOUS
  Filled 2020-10-03: qty 2

## 2020-10-03 MED ORDER — DOCUSATE SODIUM 100 MG PO CAPS
100.0000 mg | ORAL_CAPSULE | Freq: Two times a day (BID) | ORAL | Status: DC | PRN
  Administered 2020-10-08: 100 mg via ORAL

## 2020-10-03 MED ORDER — ASPIRIN EC 325 MG PO TBEC: 325.0000 mg | DELAYED_RELEASE_TABLET | Freq: Two times a day (BID) | ORAL | Status: DC

## 2020-10-03 MED ORDER — MORPHINE SULFATE (PF) 4 MG/ML IV SOLN
4.0000 mg | Freq: Once | INTRAVENOUS | Status: AC
  Administered 2020-10-03: 4 mg via INTRAVENOUS
  Filled 2020-10-03: qty 1

## 2020-10-03 MED ORDER — RASAGILINE MESYLATE 1 MG PO TABS
1.0000 mg | ORAL_TABLET | Freq: Every day | ORAL | Status: DC
  Administered 2020-10-04 – 2020-10-11 (×8): 1 mg via ORAL
  Filled 2020-10-03 (×8): qty 1

## 2020-10-03 MED ORDER — VITAMIN B-12 100 MCG PO TABS
100.0000 ug | ORAL_TABLET | Freq: Every day | ORAL | Status: DC
  Administered 2020-10-04 – 2020-10-06 (×3): 100 ug via ORAL
  Filled 2020-10-03 (×5): qty 1

## 2020-10-03 MED ORDER — HYDROMORPHONE HCL 1 MG/ML IJ SOLN
1.0000 mg | Freq: Once | INTRAMUSCULAR | Status: AC
  Administered 2020-10-03: 1 mg via INTRAVENOUS
  Filled 2020-10-03: qty 1

## 2020-10-03 MED ORDER — CARBIDOPA-LEVODOPA 25-100 MG PO TABS
1.0000 | ORAL_TABLET | Freq: Every day | ORAL | Status: DC
  Administered 2020-10-03 – 2020-10-11 (×46): 1 via ORAL
  Filled 2020-10-03 (×46): qty 1

## 2020-10-03 MED ORDER — ETOMIDATE 2 MG/ML IV SOLN
0.3000 mg/kg | Freq: Once | INTRAVENOUS | Status: AC
  Administered 2020-10-03: 14.98 mg via INTRAVENOUS
  Filled 2020-10-03: qty 10

## 2020-10-03 MED ORDER — SODIUM CHLORIDE 0.9 % IV SOLN
250.0000 mL | INTRAVENOUS | Status: DC | PRN
  Administered 2020-10-07: 250 mL via INTRAVENOUS

## 2020-10-03 NOTE — ED Notes (Signed)
Pt back from XR at this time.  

## 2020-10-03 NOTE — ED Triage Notes (Signed)
P biba from [facility] c/o of hip dislocation. Pt has hx of hip surgery at beginning of month and has work with pt at facility and reports that her hip "keeps popping out of place."

## 2020-10-03 NOTE — ED Notes (Signed)
ED Provider at bedside. 

## 2020-10-03 NOTE — ED Provider Notes (Signed)
[name] Emergency Department Provider Note  ____________________________________________  Time seen: Approximately 7:35 PM  I have reviewed the triage vital signs and the nursing notes.   HISTORY  Chief Complaint Hip Injury    HPI Jasmine Colon is a 80 y.o. female with a history of Parkinson's disease and left hip hemiarthroplasty on September 22, 9739, complicated by periprosthetic dislocation requiring closed reduction in OR on September 27, 2020, who comes ED complaining of left hip pain that is sudden onset and severe, occurring this evening.  She reports she was sitting in a chair, and when she tried to stand up she felt like she was getting a cramp in the left hip, and so she sat back down at which point it became severe pain.  It is nonradiating, worse with movement.  Denies numbness or weakness in the lower leg.    Past Medical History:  Diagnosis Date  . Asthma 2012  . Hyperlipidemia   . Lesion of lateral popliteal nerve   . Lower back pain   . 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 Mitral valve disorder   . Osteoarthritis   . Osteoporosis   . Parkinson's disease ([place]) 2012  . Personal history of radiation therapy 2013   right breast ca with lumpectomy and rad tx     Patient Active Problem List   Diagnosis Date Noted  . Dislocated hip ([place]) 09/28/2020  . Dislocation of hip prosthesis, initial encounter ([place]) 09/27/2020  . Dislocation of hip joint prosthesis, initial encounter ([place]) 09/27/2020  . Postoperative anemia 09/27/2020  . AKI (acute kidney injury) ([place]) 09/27/2020  . Malnutrition of moderate degree 09/27/2020  . Accidental fall   . Hyperlipidemia   . 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 Left displaced femoral neck fracture ([place]) 09/21/2020  . Breast cancer ([place])   . Malignant neoplasm of upper-outer quadrant of female breast ([place])  06/20/2013  . 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 Hip pain, left 07/19/2012  . Back pain, lumbar 07/19/2012  . Osteoporosis, unspecified 07/19/2012  . 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  . Parkinson disease ([place]) 07/19/2012     Past Surgical History:  Procedure Laterality Date  . BREAST BIOPSY Right 10/26/2011   invasive mammary carcinoma and DCIS  . BREAST BIOPSY Left 01/09/2014   retroaerolar bx with Dr. Jamal Collin. Benign  . BREAST LUMPECTOMY Right 11/13/2011   invasive mammary ca, DCIS, LCIS in specimen. Clear margins.  LN negative  . BREAST SURGERY Right 2013   wide excision  . CESAREAN SECTION  31 years ago  . COLONOSCOPY  2008  . HIP ARTHROPLASTY Left 09/21/2020   Procedure: ARTHROPLASTY BIPOLAR HIP (HEMIARTHROPLASTY);  Surgeon: Earnestine Leys, MD;  Location: [facility] ORS;  Service: Orthopedics;  Laterality: Left;  . HIP CLOSED REDUCTION Left 09/27/2020   Procedure: CLOSED REDUCTION HIP;  Surgeon: Renee Harder, MD;  Location: [facility] ORS;  Service: Orthopedics;  Laterality: Left;     Prior to Admission medications   Medication Sig Start Date End Date Taking? Authorizing Provider  acetaminophen (TYLENOL) 500 MG tablet Take 2 tablets (1,000 mg total) by mouth 3 (three) times daily for 5 days. Patient not taking: Reported on 09/30/2020 09/29/20 10/04/20  Edwin Dada, MD  acetaminophen (TYLENOL) 650 MG CR tablet Take 650 mg by mouth every 8 (eight) hours as needed for pain.    [provider]  aspirin EC 325 MG tablet Take 1 tablet (325 mg total) by mouth 2 (two) times daily for 14 days. 09/29/20 10/13/20  Edwin Dada, MD  aspirin EC 81 MG tablet Take 81 mg by mouth in the morning and at bedtime.    [provider]  Calcium Carbonate-Vitamin D (CALTRATE 600+D PO) Take 1 tablet by mouth daily.    [provider]  carbidopa-levodopa (SINEMET IR) 25-100 MG tablet Take 1 tablet by mouth 6 (six) times daily. 09/24/20   Fritzi Mandes,  MD  cyanocobalamin 100 MCG tablet Take 100 mcg by mouth daily.    [provider]  cyanocobalamin 1000 MCG tablet Take 1,000 mcg by mouth daily.    [provider]  docusate sodium (COLACE) 100 MG capsule Take 1 capsule (100 mg total) by mouth 2 (two) times daily. Patient taking differently: Take 100 mg by mouth 2 (two) times daily as needed for mild constipation or moderate constipation. 09/24/20   Fritzi Mandes, MD  feeding supplement (ENSURE ENLIVE / ENSURE PLUS) LIQD Take 237 mLs by mouth 3 (three) times daily between meals. 09/29/20   Danford, Suann Larry, MD  ferrous sulfate 325 (65 FE) MG tablet Take 1 tablet (325 mg total) by mouth daily with breakfast. 09/25/20   Fritzi Mandes, MD  fluticasone [facility]) 50 MCG/ACT nasal spray Place 2 sprays into both nostrils daily as needed for allergies or rhinitis.    [provider]  HYDROcodone-acetaminophen (NORCO/VICODIN) 5-325 MG tablet Take 1 tablet by mouth every 6 (six) hours as needed for moderate pain.    [provider]  ibuprofen (ADVIL) 200 MG tablet Take 400 mg by mouth every 8 (eight) hours as needed (pain).    [provider]  loratadine (CLARITIN) 10 MG tablet Take 10 mg by mouth daily as needed for allergies.    [provider]  mirtazapine (REMERON) 30 MG tablet Take 30 mg by mouth at bedtime. 06/27/20   [provider]  Multiple Vitamins-Minerals (PRESERVISION AREDS PO) Take 1 capsule by mouth daily.    [provider]  oxyCODONE (OXY IR/ROXICODONE) 5 MG immediate release tablet Take 1 tablet (5 mg total) by mouth every 4 (four) hours as needed for moderate pain. Patient taking differently: Take 5 mg by mouth every 4 (four) hours as needed for severe pain. 09/29/20   Danford, Suann Larry, MD  rasagiline (AZILECT) 1 MG TABS tablet Take 1 tablet (1 mg total) by mouth daily. 09/25/20   Fritzi Mandes, MD  sodium chloride (OCEAN) 0.65 % SOLN nasal spray Place 1 spray into both  nostrils daily as needed (allergies).    [provider]     Allergies Codeine and Pseudoephedrine   Family History  Problem Relation Age of Onset  . Stroke Mother   . Heart failure Father   . Heart disease Brother        bypass  . Cancer Maternal Aunt        breast   . Breast cancer Maternal Aunt   . Cancer Maternal Aunt        breast  . Breast cancer Maternal Aunt     Social History Social History   Tobacco Use  . Smoking status: Every Day    Packs/day: 0.50    Years: 45.00    Pack years: 22.50    Types: Cigarettes  . Smokeless tobacco: Never  Substance Use Topics  . Alcohol use: No  . Drug use: No  Review of Systems  Constitutional:   No fever or chills.  ENT:   No sore throat. No rhinorrhea. Cardiovascular:   No chest pain or syncope. Respiratory:   No dyspnea or cough. Gastrointestinal:   Negative for abdominal pain, vomiting and diarrhea.  Musculoskeletal:   Left hip pain as above All other systems reviewed and are negative except as documented above in ROS and HPI.  ____________________________________________   PHYSICAL EXAM:  VITAL SIGNS: ED Triage Vitals  Enc Vitals Group     BP 10/03/20 1931 118/81     Pulse Rate 10/03/20 1931 90     Resp 10/03/20 1931 20     Temp 10/03/20 1931 98.2 F (36.8 C)     Temp Source 10/03/20 1931 Oral     SpO2 10/03/20 1931 95 %     Weight 10/03/20 1929 110 lb (49.9 kg)     Height 10/03/20 1929 5\' 5"  (1.651 m)     Head Circumference --      Peak Flow --      Pain Score 10/03/20 1932 3     Pain Loc --      Pain Edu? --      Excl. in [place]? --     Vital signs reviewed, nursing assessments reviewed.   Constitutional:   Alert and oriented. Non-toxic appearance. Eyes:   Conjunctivae are normal. EOMI. PERRL. ENT      Head:   Normocephalic and atraumatic.      Nose:   Wearing a mask.      Mouth/Throat:   Wearing a mask.      Neck:   No meningismus. Full ROM. Hematological/Lymphatic/Immunilogical:    No cervical lymphadenopathy. Cardiovascular:   RRR. Symmetric bilateral radial and DP pulses.  No murmurs. Cap refill less than 2 seconds. Respiratory:   Normal respiratory effort without tachypnea/retractions. Breath sounds are clear and equal bilaterally. No wheezes/rales/rhonchi. Gastrointestinal:   Soft and nontender. Non distended. There is no CVA tenderness.  No rebound, rigidity, or guarding. Genitourinary:   deferred Musculoskeletal:   There is shortening of the left lower extremity and limitation of range of motion of the left hip.  There is tenderness around the greater trochanter.  Surgical incision is intact with staples present. Neurologic:   Normal speech and language.  Motor grossly intact. No acute focal neurologic deficits are appreciated.  Skin:    Skin is warm, dry and intact. No rash noted.  No petechiae, purpura, or bullae.  ____________________________________________    LABS (pertinent positives/negatives) (all labs ordered are listed, but only abnormal results are displayed) Labs Reviewed  CBC WITH DIFFERENTIAL/PLATELET - Abnormal; Notable for the following components:      Result Value   RBC 2.90 (*)    Hemoglobin 9.4 (*)    HCT 28.5 (*)    Platelets 585 (*)    Abs Immature Granulocytes 0.10 (*)    All other components within normal limits  BASIC METABOLIC PANEL - Abnormal; Notable for the following components:   Calcium 8.4 (*)    All other components within normal limits  CBC WITH DIFFERENTIAL/PLATELET   ____________________________________________   EKG    ____________________________________________    RADIOLOGY  DG Chest 1 View  Result Date: 10/03/2020 CLINICAL DATA:  Left hip pain EXAM: CHEST  1 VIEW COMPARISON:  09/30/2020 FINDINGS: The lungs are hyperinflated with diffuse interstitial prominence. No focal airspace consolidation or pulmonary edema. No pleural effusion or pneumothorax. Normal cardiomediastinal contours. IMPRESSION:  Hyperinflation without acute airspace  disease. Electronically Signed   By: Ulyses Jarred M.D.   On: 10/03/2020 20:40   DG Hip Unilat W or Wo Pelvis 2-3 Views Left  Result Date: 10/03/2020 CLINICAL DATA:  Postreduction EXAM: DG HIP (WITH OR WITHOUT PELVIS) 2-3V LEFT COMPARISON:  10/03/2020 FINDINGS: Interval reduction of the previously seen dislocated left hip replacement. Normal alignment. No fracture. IMPRESSION: Interval reduction.  No fracture Electronically Signed   By: Rolm Baptise M.D.   On: 10/03/2020 22:17   DG Hip Unilat W or Wo Pelvis 2-3 Views Left  Result Date: 10/03/2020 CLINICAL DATA:  80 year old female with left hip pain. EXAM: DG HIP (WITH OR WITHOUT PELVIS) 2-3V LEFT COMPARISON:  Left hip radiograph dated 09/30/2020. FINDINGS: There is a left hip arthroplasty. There is superior dislocation of the arthroplasty of side of the and abdomen. No definite acute fracture identified. The bones are osteopenic. The soft tissues are grossly unremarkable. Cutaneous clips noted over the left hip. IMPRESSION: Superior dislocation of the left hip arthroplasty. Electronically Signed   By: Anner Crete M.D.   On: 10/03/2020 20:38    ____________________________________________   PROCEDURES .Ortho Injury Treatment  Date/Time: 10/03/2020 8:42 PM Performed by: Carrie Mew, MD Authorized by: Carrie Mew, MD   Consent:    Consent obtained:  Written   Consent given by:  Patient   Risks discussed:  Fracture, nerve damage, irreducible dislocation, restricted joint movement, stiffness and recurrent dislocation   Alternatives discussed:  ReferralInjury location: hip Location details: left hip Injury type: dislocation Dislocation type: posterior Spontaneous dislocation: yes Prosthesis: yes Pre-procedure neurovascular assessment: neurovascularly intact Pre-procedure distal perfusion: normal Pre-procedure neurological function: normal Pre-procedure range of motion:  reduced  Anesthesia: Local anesthesia used: no  Patient sedated: Yes. Refer to sedation procedure documentation for details of sedation. Manipulation performed: yes Reduction method: traction and counter traction (traction, adduction and internal rotation, hip flexion and external rotation) Reduction successful: yes X-ray confirmed reduction: yes Immobilization: brace (knee immobilizer) Splint Applied by: ED Tech Post-procedure neurovascular assessment: post-procedure neurovascularly intact Post-procedure distal perfusion: normal Post-procedure neurological function: normal Post-procedure range of motion: improved Comments:      .Sedation  Date/Time: 10/03/2020 8:42 PM Performed by: Carrie Mew, MD Authorized by: Carrie Mew, MD   Consent:    Consent obtained:  Written   Consent given by:  Patient   Risks discussed:  Allergic reaction, dysrhythmia, inadequate sedation, nausea, vomiting, respiratory compromise necessitating ventilatory assistance and intubation and prolonged hypoxia resulting in organ damage   Alternatives discussed:  Analgesia without sedation Universal protocol:    Immediately prior to procedure, a time out was called: yes     Patient identity confirmed:  Arm band and verbally with patient Indications:    Procedure performed:  Dislocation reduction   Procedure necessitating sedation performed by:  Physician performing sedation Pre-sedation assessment:    Time since last food or drink:  8 hours   ASA classification: class 2 - patient with mild systemic disease     Mouth opening:  3 or more finger widths   Thyromental distance:  3 finger widths   Mallampati score:  I - soft palate, uvula, fauces, pillars visible   Neck mobility: normal     Pre-sedation assessments completed and reviewed: airway patency, cardiovascular function, hydration status, mental status, nausea/vomiting, pain level and respiratory function     Pre-sedation assessment  completed:  10/03/2020 8:43 PM Immediate pre-procedure details:    Reassessment: Patient reassessed immediately prior to procedure     Reviewed:  vital signs     Verified: bag valve mask available, emergency equipment available, intubation equipment available, IV patency confirmed, oxygen available and suction available   Procedure details (see MAR for exact dosages):    Preoxygenation:  Nasal cannula   Sedation:  Etomidate   Intended level of sedation: deep   Analgesia:  Hydromorphone   Intra-procedure monitoring:  Blood pressure monitoring, cardiac monitor, continuous capnometry, continuous pulse oximetry, frequent LOC assessments and frequent vital sign checks   Intra-procedure events: none     Total Provider sedation time (minutes):  25 Post-procedure details:    Post-sedation assessment completed:  10/03/2020 10:00 PM   Attendance: Constant attendance by certified staff until patient recovered     Recovery: Patient returned to pre-procedure baseline     Post-sedation assessments completed and reviewed: airway patency, cardiovascular function, hydration status, mental status, nausea/vomiting, pain level and respiratory function     Patient is stable for discharge or admission: yes     Procedure completion:  Tolerated well, no immediate complications  ____________________________________________  DIFFERENTIAL DIAGNOSIS   Hip fracture, hip dislocation, muscle strain  CLINICAL IMPRESSION / ASSESSMENT AND PLAN / ED COURSE  Medications ordered in the ED: Medications  carbidopa-levodopa (SINEMET IR) 25-100 MG per tablet immediate release 1 tablet (has no administration in time range)  morphine 4 MG/ML injection 4 mg (4 mg Intravenous Given 10/03/20 2001)  ondansetron (ZOFRAN) injection 4 mg (4 mg Intravenous Given 10/03/20 1959)  etomidate (AMIDATE) injection 14.98 mg (14.98 mg Intravenous Given by Other 10/03/20 2144)  HYDROmorphone (DILAUDID) injection 1 mg (1 mg Intravenous Given  10/03/20 2116)    Pertinent labs & imaging results that were available during my care of the patient were reviewed by me and considered in my medical decision making (see chart for details).  Sakina Briones was evaluated in Emergency Department on 10/03/2020 for the symptoms described in the history of present illness. She was evaluated in the context of the global COVID-19 pandemic, which necessitated consideration that the patient might be at risk for infection with the SARS-CoV-2 virus that causes COVID-19. Institutional protocols and algorithms that pertain to the evaluation of patients at risk for COVID-19 are in a state of rapid change based on information released by regulatory bodies including the CDC and federal and state organizations. These policies and algorithms were followed during the patient's care in the ED.   Patient presents with suspected hip dislocation of her left hip prosthesis, possibly provoked by sitting in a chair with hip in flexion.  No trauma.  Will give IV morphine 4 mg, obtain x-rays.  Clinical Course as of 10/03/20 2306  Thu Oct 03, 2020  2235 Repeat hip x-ray viewed and interpreted by me, shows interval reduction of the left hip.  No fracture.  Radiology report confirms.  Case discussed with orthopedics Dr. Posey Pronto who recommends changing stabilization device to a knee immobilizer.  [PS]  2305 D/w [facility]o Dr. Sharlet Salina who will notify Dr. Sabra Heck and plan to eval tomorrow.  [PS]    Clinical Course User Index [PS] Carrie Mew, MD     ____________________________________________   FINAL CLINICAL IMPRESSION(S) / ED DIAGNOSES    Final diagnoses:  Closed posterior dislocation of left hip, initial encounter [facility])  Parkinson's disease [facility])     ED Discharge Orders     None       Portions of this note were generated with dragon dictation software. Dictation errors may occur despite best attempts at proofreading.  Carrie Mew,  MD 10/03/20 2246

## 2020-10-03 NOTE — ED Notes (Signed)
Patient transported to X-ray 

## 2020-10-03 NOTE — H&P (Addendum)
History and Physical    Jasmine Colon STM:[id number] DOB: 03/24/1941 DOA: 10/03/2020  PCP: Dion Body, MD   Patient coming from: [facility]  I have personally briefly reviewed patient's old medical records in [place]  Chief Complaint: Left hip pain  HPI: Jasmine Colon is a 80 y.o. female with medical history significant for Parkinson's disease, status post left hip hemiarthroplasty on 09/21/20 following a fall complicated by periprosthetic dislocation requiring closed reduction in the OR on 09/27/20.   Patient presents to the ER via EMS with complaints of left hip pain that occurred suddenly.  She describes it as a severe pain and rated an 8 x 10 in intensity at its worst.  She denies any trauma and states that the pain started after she tried to get up from a sitting position and so she sat back down.  Pain is nonradiating and is worse with any form of movement.  She denies having any numbness or weakness in her leg. She denies having any chest pain, no cough, no fever, no chills, no nausea, no vomiting, no urinary symptoms, no headache, no dizziness or lightheadedness. Labs show sodium 135, potassium 4.1, chloride 104, bicarb 24, glucose 97, BUN 13, creatinine 0.61, calcium 8.4, white count 10.2, hemoglobin 9.4, hematocrit 28.5, MCV 98.3, RDW 15.2, platelet count 585 Chest x-ray reviewed by me shows hyperinflation without acute airspace disease. Left hip x-ray shows superior dislocation of the left hip arthroplasty.   ED Course: Patient is a 80 year old female who presents to the ER for evaluation of pain in her left hip.  She is status post left hip arthroplasty complicated by periprosthetic dislocation that required closed reduction in the OR. X-ray shows superior dislocation of the left hip arthroplasty and patient is status post reduction by ER physician.  She is currently in left knee immobilizer for stabilization. She will be referred to observation status for  further evaluation.    Review of Systems: As per HPI otherwise all other systems reviewed and negative.    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 Hormone positive, HER 2 no amplified   Mitral valve disorder    Osteoarthritis    Osteoporosis    Parkinson's disease ([place]) 2012   Personal history of radiation therapy 2013   right breast ca with lumpectomy and rad tx    Past Surgical History:  Procedure Laterality Date   BREAST BIOPSY Right 10/26/2011   invasive mammary carcinoma and DCIS   BREAST BIOPSY Left 01/09/2014   retroaerolar bx with Dr. Jamal Collin. Benign   BREAST LUMPECTOMY Right 11/13/2011   invasive mammary ca, DCIS, LCIS in specimen. Clear margins.  LN negative   BREAST SURGERY Right 2013   wide excision   CESAREAN SECTION  31 years ago   COLONOSCOPY  2008   HIP ARTHROPLASTY Left 09/21/2020   Procedure: ARTHROPLASTY BIPOLAR HIP (HEMIARTHROPLASTY);  Surgeon: Earnestine Leys, MD;  Location: [facility] ORS;  Service: Orthopedics;  Laterality: Left;   HIP CLOSED REDUCTION Left 09/27/2020   Procedure: CLOSED REDUCTION HIP;  Surgeon: Renee Harder, MD;  Location: [facility] ORS;  Service: Orthopedics;  Laterality: Left;     reports that she has been smoking cigarettes. She has a 22.50 pack-year smoking history. She has never used smokeless tobacco.  She reports that she does not drink alcohol and does not use drugs.  Allergies  Allergen Reactions   Codeine     Feels weird   Pseudoephedrine     Other reaction(s): Dizziness and giddiness (finding)    Family History  Problem Relation Age of Onset   Stroke Mother    Heart failure Father    Heart disease Brother        bypass   Cancer Maternal Aunt        breast    Breast cancer Maternal Aunt    Cancer Maternal Aunt        breast   Breast cancer  Maternal Aunt       Prior to Admission medications   Medication Sig Start Date End Date Taking? Authorizing Provider  acetaminophen (TYLENOL) 500 MG tablet Take 2 tablets (1,000 mg total) by mouth 3 (three) times daily for 5 days. Patient not taking: Reported on 09/30/2020 09/29/20 10/04/20  Edwin Dada, MD  acetaminophen (TYLENOL) 650 MG CR tablet Take 650 mg by mouth every 8 (eight) hours as needed for pain.    [provider]  aspirin EC 325 MG tablet Take 1 tablet (325 mg total) by mouth 2 (two) times daily for 14 days. 09/29/20 10/13/20  Edwin Dada, MD  aspirin EC 81 MG tablet Take 81 mg by mouth in the morning and at bedtime.    [provider]  Calcium Carbonate-Vitamin D (CALTRATE 600+D PO) Take 1 tablet by mouth daily.    [provider]  carbidopa-levodopa (SINEMET IR) 25-100 MG tablet Take 1 tablet by mouth 6 (six) times daily. 09/24/20   Fritzi Mandes, MD  cyanocobalamin 100 MCG tablet Take 100 mcg by mouth daily.    [provider]  cyanocobalamin 1000 MCG tablet Take 1,000 mcg by mouth daily.    [provider]  docusate sodium (COLACE) 100 MG capsule Take 1 capsule (100 mg total) by mouth 2 (two) times daily. Patient taking differently: Take 100 mg by mouth 2 (two) times daily as needed for mild constipation or moderate constipation. 09/24/20   Fritzi Mandes, MD  feeding supplement (ENSURE ENLIVE / ENSURE PLUS) LIQD Take 237 mLs by mouth 3 (three) times daily between meals. 09/29/20   Danford, Suann Larry, MD  ferrous sulfate 325 (65 FE) MG tablet Take 1 tablet (325 mg total) by mouth daily with breakfast. 09/25/20   Fritzi Mandes, MD  fluticasone [facility]) 50 MCG/ACT nasal spray Place 2 sprays into both nostrils daily as needed for allergies or rhinitis.    [provider]  HYDROcodone-acetaminophen (NORCO/VICODIN) 5-325 MG tablet Take 1 tablet by mouth every 6 (six) hours as needed for moderate pain.    [provider]  ibuprofen (ADVIL) 200 MG tablet Take 400 mg by mouth every 8 (eight) hours as needed (pain).    [provider]  loratadine (CLARITIN) 10 MG tablet Take 10 mg by mouth daily as needed for allergies.    [provider]  mirtazapine (REMERON) 30 MG tablet Take 30 mg by mouth at bedtime. 06/27/20   [provider]  Multiple Vitamins-Minerals (PRESERVISION AREDS PO) Take 1 capsule by mouth daily.    [provider]  oxyCODONE (OXY IR/ROXICODONE) 5 MG immediate release tablet Take 1 tablet (5 mg total) by mouth every 4 (four) hours as needed for moderate pain. Patient taking differently: Take 5 mg by mouth every 4 (four) hours as needed for severe pain. 09/29/20  Danford, Suann Larry, MD  rasagiline (AZILECT) 1 MG TABS tablet Take 1 tablet (1 mg total) by mouth daily. 09/25/20   Fritzi Mandes, MD  sodium chloride (OCEAN) 0.65 % SOLN nasal spray Place 1 spray into both nostrils daily as needed (allergies).    [provider]    Physical Exam: Vitals:   10/03/20 2155 10/03/20 2218 10/03/20 2233 10/03/20 2314  BP: (!) 141/80  126/82 123/72  Pulse: 80 83 84 85  Resp: 18 19 18 16   Temp:      TempSrc:      SpO2: 98% 98% 97% 90%  Weight:      Height:         Vitals:   10/03/20 2155 10/03/20 2218 10/03/20 2233 10/03/20 2314  BP: (!) 141/80  126/82 123/72  Pulse: 80 83 84 85  Resp: 18 19 18 16   Temp:      TempSrc:      SpO2: 98% 98% 97% 90%  Weight:      Height:          Constitutional: Alert and oriented x 3 . Not in any apparent distress.  Thin and frail HEENT:      Head: Normocephalic and atraumatic.         Eyes: PERLA, EOMI, Conjunctivae are normal. Sclera is non-icteric.       Mouth/Throat: Mucous membranes are moist.       Neck: Supple with no signs of meningismus. Cardiovascular: Regular rate and rhythm. No murmurs, gallops, or rubs. 2+ symmetrical distal pulses are present . No JVD. 1+ Left LE edema Respiratory:  Respiratory effort normal .Lungs sounds clear bilaterally. No wheezes, crackles, or rhonchi.  Gastrointestinal: Soft, non tender, and non distended with positive bowel sounds.  Genitourinary: No CVA tenderness. Musculoskeletal: Decreased range of motion left hip.  Left knee immobilizer in place  Neurologic:  Face is symmetric. Moving all extremities. No gross focal neurologic deficits . Skin: Skin is warm, dry.  No rash or ulcers Psychiatric: Mood and affect are normal    Labs on Admission: I have personally reviewed following labs and imaging studies  CBC: Recent Labs  Lab 09/27/20 0054 09/27/20 0349 09/28/20 0503 09/28/20 1424 09/29/20 0533 09/30/20 1133 10/03/20 2010  WBC 10.5 9.1 9.5  --  9.6 12.2* 10.2  NEUTROABS 8.2*  --   --   --   --  8.5* 7.2  HGB 7.9* 7.2* 6.9* 7.7* 8.4* 9.5* 9.4*  HCT 23.1* 21.2* 20.6* 22.2* 24.7* 28.3* 28.5*  MCV 94.7 95.1 95.4  --  95.7 98.6 98.3  PLT 346 327 376  --  399 461* 916*   Basic Metabolic Panel: Recent Labs  Lab 09/27/20 0349 09/28/20 0503 09/29/20 0533 09/30/20 1133 10/03/20 2050  NA 136 137 136 136 135  K 4.4 4.3 3.7 4.8 4.1  CL 101 106 107 106 104  CO2 26 26 26 22 24   GLUCOSE 128* 105* 97 88 97  BUN 33* 20 17 19 13   CREATININE 1.01* 0.76 0.69 0.71 0.61  CALCIUM 8.0* 7.9* 7.8* 8.3* 8.4*   GFR: Estimated Creatinine Clearance: 46.4 mL/min (by C-G formula based on SCr of 0.61 mg/dL). Liver Function Tests: Recent Labs  Lab 09/27/20 0054  AST 50*  ALT 8  ALKPHOS 68  BILITOT 1.6*  PROT 6.6  ALBUMIN 2.9*   No results for input(s): LIPASE, AMYLASE in the last 168 hours. No results for input(s): AMMONIA in the last 168 hours. Coagulation Profile: Recent Labs  Lab 09/27/20 0054  INR 1.1   Cardiac Enzymes: No results for input(s): CKTOTAL, CKMB, CKMBINDEX, TROPONINI in the last 168 hours. BNP (last 3 results) No results for input(s): PROBNP in the last 8760 hours. HbA1C: No results for input(s): HGBA1C in the last  72 hours. CBG: No results for input(s): GLUCAP in the last 168 hours. Lipid Profile: No results for input(s): CHOL, HDL, LDLCALC, TRIG, CHOLHDL, LDLDIRECT in the last 72 hours. Thyroid Function Tests: No results for input(s): TSH, T4TOTAL, FREET4, T3FREE, THYROIDAB in the last 72 hours. Anemia Panel: No results for input(s): VITAMINB12, FOLATE, FERRITIN, TIBC, IRON, RETICCTPCT in the last 72 hours. Urine analysis:    Component Value Date/Time   COLORURINE YELLOW (A) 09/21/2020 0153   APPEARANCEUR CLEAR (A) 09/21/2020 0153   LABSPEC 1.021 09/21/2020 0153   PHURINE 5.0 09/21/2020 0153   GLUCOSEU NEGATIVE 09/21/2020 0153   HGBUR NEGATIVE 09/21/2020 0153   BILIRUBINUR NEGATIVE 09/21/2020 0153   KETONESUR 5 (A) 09/21/2020 0153   PROTEINUR NEGATIVE 09/21/2020 0153   NITRITE NEGATIVE 09/21/2020 0153   LEUKOCYTESUR SMALL (A) 09/21/2020 0153    Radiological Exams on Admission: DG Chest 1 View  Result Date: 10/03/2020 CLINICAL DATA:  Left hip pain EXAM: CHEST  1 VIEW COMPARISON:  09/30/2020 FINDINGS: The lungs are hyperinflated with diffuse interstitial prominence. No focal airspace consolidation or pulmonary edema. No pleural effusion or pneumothorax. Normal cardiomediastinal contours. IMPRESSION: Hyperinflation without acute airspace disease. Electronically Signed   By: Ulyses Jarred M.D.   On: 10/03/2020 20:40   DG Hip Unilat W or Wo Pelvis 2-3 Views Left  Result Date: 10/03/2020 CLINICAL DATA:  Postreduction EXAM: DG HIP (WITH OR WITHOUT PELVIS) 2-3V LEFT COMPARISON:  10/03/2020 FINDINGS: Interval reduction of the previously seen dislocated left hip replacement. Normal alignment. No fracture. IMPRESSION: Interval reduction.  No fracture Electronically Signed   By: Rolm Baptise M.D.   On: 10/03/2020 22:17   DG Hip Unilat W or Wo Pelvis 2-3 Views Left  Result Date: 10/03/2020 CLINICAL DATA:  80 year old female with left hip pain. EXAM: DG HIP (WITH OR WITHOUT PELVIS) 2-3V LEFT COMPARISON:   Left hip radiograph dated 09/30/2020. FINDINGS: There is a left hip arthroplasty. There is superior dislocation of the arthroplasty of side of the and abdomen. No definite acute fracture identified. The bones are osteopenic. The soft tissues are grossly unremarkable. Cutaneous clips noted over the left hip. IMPRESSION: Superior dislocation of the left hip arthroplasty. Electronically Signed   By: Anner Crete M.D.   On: 10/03/2020 20:38     Assessment/Plan Principal Problem:   Hip dislocation, left (HCC) Active Problems:   Parkinson's disease (HCC)   Malnutrition of moderate degree    Left hip dislocation Atraumatic and recurrent (recent closed reduction in the OR on 09/27/20) Most likely provoked by patient sitting in a chair with hip in flexion Patient is status post interval reduction of left hip and is currently in a knee immobilizer Pain control Consult orthopedic surgery     Parkinson's disease Continue Sinemet and rasagiline     Malnutrition of moderate degree (BMI 18) Continue nutritional supplements 3 times a day Maintain regular diet    Depression Continue mirtazapine  DVT prophylaxis: Lovenox  Code Status: full code  Family Communication: Greater than 50% of time was spent discussing patient's condition and plan of care with her husband who was at the bedside.  All questions and concerns have been addressed.  He verbalizes understanding and agrees with the plan. Disposition  Plan: Back to previous home environment Consults called: Orthopedic surgery Status: Observation    Markea Ruzich MD [facility]     10/04/2020, 12:12 AM

## 2020-10-04 ENCOUNTER — Encounter: Payer: Self-pay | Admitting: Internal Medicine

## 2020-10-04 ENCOUNTER — Inpatient Hospital Stay: Payer: PPO

## 2020-10-04 DIAGNOSIS — F32A Depression, unspecified: Secondary | ICD-10-CM | POA: Diagnosis present

## 2020-10-04 DIAGNOSIS — G20 Parkinson's disease: Secondary | ICD-10-CM | POA: Diagnosis present

## 2020-10-04 DIAGNOSIS — Z8249 Family history of ischemic heart disease and other diseases of the circulatory system: Secondary | ICD-10-CM | POA: Diagnosis not present

## 2020-10-04 DIAGNOSIS — Y831 Surgical operation with implant of artificial internal device as the cause of abnormal reaction of the patient, or of later complication, without mention of misadventure at the time of the procedure: Secondary | ICD-10-CM | POA: Diagnosis present

## 2020-10-04 DIAGNOSIS — Z20822 Contact with and (suspected) exposure to covid-19: Secondary | ICD-10-CM | POA: Diagnosis present

## 2020-10-04 DIAGNOSIS — G249 Dystonia, unspecified: Secondary | ICD-10-CM | POA: Diagnosis present

## 2020-10-04 DIAGNOSIS — Z923 Personal history of irradiation: Secondary | ICD-10-CM | POA: Diagnosis not present

## 2020-10-04 DIAGNOSIS — T84021A Dislocation of internal left hip prosthesis, initial encounter: Secondary | ICD-10-CM | POA: Diagnosis present

## 2020-10-04 DIAGNOSIS — M25552 Pain in left hip: Secondary | ICD-10-CM | POA: Diagnosis not present

## 2020-10-04 DIAGNOSIS — M810 Age-related osteoporosis without current pathological fracture: Secondary | ICD-10-CM | POA: Diagnosis present

## 2020-10-04 DIAGNOSIS — S73005D Unspecified dislocation of left hip, subsequent encounter: Secondary | ICD-10-CM | POA: Diagnosis not present

## 2020-10-04 DIAGNOSIS — I959 Hypotension, unspecified: Secondary | ICD-10-CM | POA: Diagnosis present

## 2020-10-04 DIAGNOSIS — S73005A Unspecified dislocation of left hip, initial encounter: Secondary | ICD-10-CM | POA: Diagnosis present

## 2020-10-04 DIAGNOSIS — D62 Acute posthemorrhagic anemia: Secondary | ICD-10-CM | POA: Diagnosis not present

## 2020-10-04 DIAGNOSIS — S3739XA Other injury of urethra, initial encounter: Secondary | ICD-10-CM | POA: Diagnosis not present

## 2020-10-04 DIAGNOSIS — K5900 Constipation, unspecified: Secondary | ICD-10-CM | POA: Diagnosis present

## 2020-10-04 DIAGNOSIS — Z823 Family history of stroke: Secondary | ICD-10-CM | POA: Diagnosis not present

## 2020-10-04 DIAGNOSIS — R4189 Other symptoms and signs involving cognitive functions and awareness: Secondary | ICD-10-CM | POA: Diagnosis present

## 2020-10-04 DIAGNOSIS — Z7982 Long term (current) use of aspirin: Secondary | ICD-10-CM | POA: Diagnosis not present

## 2020-10-04 DIAGNOSIS — E440 Moderate protein-calorie malnutrition: Secondary | ICD-10-CM | POA: Diagnosis present

## 2020-10-04 DIAGNOSIS — F17210 Nicotine dependence, cigarettes, uncomplicated: Secondary | ICD-10-CM | POA: Diagnosis present

## 2020-10-04 DIAGNOSIS — Z681 Body mass index (BMI) 19 or less, adult: Secondary | ICD-10-CM | POA: Diagnosis not present

## 2020-10-04 DIAGNOSIS — Z853 Personal history of malignant neoplasm of breast: Secondary | ICD-10-CM | POA: Diagnosis not present

## 2020-10-04 LAB — CBC
HCT: 27.3 % — ABNORMAL LOW (ref 36.0–46.0)
Hemoglobin: 9.1 g/dL — ABNORMAL LOW (ref 12.0–15.0)
MCH: 32.9 pg (ref 26.0–34.0)
MCHC: 33.3 g/dL (ref 30.0–36.0)
MCV: 98.6 fL (ref 80.0–100.0)
Platelets: 533 10*3/uL — ABNORMAL HIGH (ref 150–400)
RBC: 2.77 MIL/uL — ABNORMAL LOW (ref 3.87–5.11)
RDW: 15.2 % (ref 11.5–15.5)
WBC: 10.8 10*3/uL — ABNORMAL HIGH (ref 4.0–10.5)
nRBC: 0 % (ref 0.0–0.2)

## 2020-10-04 LAB — BASIC METABOLIC PANEL
Anion gap: 4 — ABNORMAL LOW (ref 5–15)
BUN: 13 mg/dL (ref 8–23)
CO2: 28 mmol/L (ref 22–32)
Calcium: 8.1 mg/dL — ABNORMAL LOW (ref 8.9–10.3)
Chloride: 102 mmol/L (ref 98–111)
Creatinine, Ser: 0.71 mg/dL (ref 0.44–1.00)
GFR, Estimated: >60 mL/min (ref >60)
Glucose, Bld: 93 mg/dL (ref 70–99)
Potassium: 3.9 mmol/L (ref 3.5–5.1)
Sodium: 134 mmol/L — ABNORMAL LOW (ref 135–145)

## 2020-10-04 LAB — SARS CORONAVIRUS 2 (TAT 6-24 HRS): SARS Coronavirus 2: NEGATIVE

## 2020-10-04 MED ORDER — ENOXAPARIN SODIUM 40 MG/0.4ML IJ SOSY
40.0000 mg | PREFILLED_SYRINGE | INTRAMUSCULAR | Status: DC
  Administered 2020-10-04 – 2020-10-06 (×3): 40 mg via SUBCUTANEOUS
  Filled 2020-10-04 (×3): qty 0.4

## 2020-10-04 NOTE — Consult Note (Signed)
ORTHOPAEDIC CONSULTATION  REQUESTING PHYSICIAN: Lorella Nimrod, MD  Chief Complaint: Left hip pain  HPI: Jasmine Colon is a 80 y.o. female who complains of left hip pain following a recurrent dislocation of her left hemiarthroplasty.  The patient was seen in the emergency room by myself approximately 12 PM today.  Her husband was present with her.  We had a long discussion about the possible treatment available for this recurrent problem.  At that point the patient was in a knee immobilizer and the hip was reduced.  I advised them that it was likely that the hip would remain unstable and that a revision operation could possibly correct this.  I had contacted Dr. Harlow Mares my associate about this and he indicated that he could attempt this on Monday if the family wish to proceed with that.  Since then the hip was redislocated upon transfer to the nursing floor and Dr. Mack Guise has seen the patient as well.  The decision to leave the hip dislocated has been made since efforts at keeping it reduced seem futile.  I advised the patient and her husband that there was no guarantee that a total hip arthroplasty would be a definitive cure for this problem.  She has such violent spasms and dysarthric movements that nothing may be successful.  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 Hormone positive, HER 2 no amplified   Mitral valve disorder    Osteoarthritis    Osteoporosis    Parkinson's disease ([place]) 2012   Personal history of radiation therapy 2013   right breast ca with lumpectomy and rad tx   Past Surgical History:  Procedure Laterality Date   BREAST BIOPSY Right 10/26/2011   invasive mammary carcinoma and DCIS   BREAST BIOPSY Left 01/09/2014   retroaerolar bx with Dr. Jamal Collin. Benign   BREAST LUMPECTOMY Right  11/13/2011   invasive mammary ca, DCIS, LCIS in specimen. Clear margins.  LN negative   BREAST SURGERY Right 2013   wide excision   CESAREAN SECTION  31 years ago   COLONOSCOPY  2008   HIP ARTHROPLASTY Left 09/21/2020   Procedure: ARTHROPLASTY BIPOLAR HIP (HEMIARTHROPLASTY);  Surgeon: Earnestine Leys, MD;  Location: [facility] ORS;  Service: Orthopedics;  Laterality: Left;   HIP CLOSED REDUCTION Left 09/27/2020   Procedure: CLOSED REDUCTION HIP;  Surgeon: Renee Harder, MD;  Location: [facility] ORS;  Service: Orthopedics;  Laterality: Left;   Social History   Socioeconomic History   Marital status: Married    Spouse name: Jori Moll   Number of children: 2   Years of education: college   Highest education level: Not on file  Occupational History   Occupation: part time    Comment: Product manager day week,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  Tobacco Use   Smoking status: Every Day    Packs/day: 0.50    Years: 45.00    Pack years: 22.50    Types: Cigarettes   Smokeless tobacco: Never  Substance and Sexual Activity   Alcohol use: No   Drug use: No   Sexual activity: Not on file  Other Topics Concern   Not on file  Social History Narrative   Patient is right handed, resides with husband   Social Determinants of Health   Financial Resource Strain: Not  on file  Food Insecurity: Not on file  Transportation Needs: Not on file  Physical Activity: Not on file  Stress: Not on file  Social Connections: Not on file   Family History  Problem Relation Age of Onset   Stroke Mother    Heart failure Father    Heart disease Brother        bypass   Cancer Maternal Aunt        breast    Breast cancer Maternal Aunt    Cancer Maternal Aunt        breast   Breast cancer Maternal Aunt    Allergies  Allergen Reactions   Codeine     Feels weird   Pseudoephedrine     Other reaction(s): Dizziness and giddiness (finding)   Prior to Admission medications   Medication Sig Start Date End Date  Taking? Authorizing Provider  acetaminophen (TYLENOL) 650 MG CR tablet Take 650 mg by mouth every 8 (eight) hours as needed for pain.   Yes [provider]  aspirin EC 325 MG tablet Take 1 tablet (325 mg total) by mouth 2 (two) times daily for 14 days. 09/29/20 10/13/20 Yes Danford, Suann Larry, MD  Calcium Carbonate-Vitamin D (CALTRATE 600+D PO) Take 1 tablet by mouth daily.   Yes [provider]  carbidopa-levodopa (SINEMET IR) 25-100 MG tablet Take 1 tablet by mouth 6 (six) times daily. 09/24/20  Yes Fritzi Mandes, MD  cyanocobalamin 100 MCG tablet Take 100 mcg by mouth daily.   Yes [provider]  docusate sodium (COLACE) 100 MG capsule Take 1 capsule (100 mg total) by mouth 2 (two) times daily. Patient taking differently: Take 100 mg by mouth 2 (two) times daily as needed for mild constipation or moderate constipation. 09/24/20  Yes Fritzi Mandes, MD  ferrous sulfate 325 (65 FE) MG tablet Take 1 tablet (325 mg total) by mouth daily with breakfast. 09/25/20  Yes Fritzi Mandes, MD  Multiple Vitamins-Minerals (PRESERVISION AREDS PO) Take 1 capsule by mouth daily.   Yes [provider]  oxyCODONE (OXY IR/ROXICODONE) 5 MG immediate release tablet Take 1 tablet (5 mg total) by mouth every 4 (four) hours as needed for moderate pain. Patient taking differently: Take 5 mg by mouth every 4 (four) hours as needed for severe pain. 09/29/20  Yes Danford, Suann Larry, MD  rasagiline (AZILECT) 1 MG TABS tablet Take 1 tablet (1 mg total) by mouth daily. 09/25/20  Yes Fritzi Mandes, MD  aspirin EC 81 MG tablet Take 81 mg by mouth in the morning and at bedtime.    [provider]  cyanocobalamin 1000 MCG tablet Take 1,000 mcg by mouth daily. Patient not taking: Reported on 10/04/2020    [provider]  feeding supplement (ENSURE ENLIVE / ENSURE PLUS) LIQD Take 237 mLs by mouth 3 (three) times daily between meals. 09/29/20   Danford, Suann Larry, MD  fluticasone (FLONASE)  50 MCG/ACT nasal spray Place 2 sprays into both nostrils daily as needed for allergies or rhinitis.    [provider]  HYDROcodone-acetaminophen (NORCO/VICODIN) 5-325 MG tablet Take 1 tablet by mouth every 6 (six) hours as needed for moderate pain. Patient not taking: No sig reported    [provider]  ibuprofen (ADVIL) 200 MG tablet Take 400 mg by mouth every 8 (eight) hours as needed (pain).    [provider]  loratadine (CLARITIN) 10 MG tablet Take 10 mg by mouth daily as needed for allergies.    [provider]  mirtazapine (REMERON) 30 MG tablet Take 30 mg by mouth at bedtime. 06/27/20   [provider]  sodium chloride (OCEAN) 0.65 % SOLN nasal spray Place 1 spray into both nostrils daily as needed (allergies).    [provider]   DG Chest 1 View  Result Date: 10/03/2020 CLINICAL DATA:  Left hip pain EXAM: CHEST  1 VIEW COMPARISON:  09/30/2020 FINDINGS: The lungs are hyperinflated with diffuse interstitial prominence. No focal airspace consolidation or pulmonary edema. No pleural effusion or pneumothorax. Normal cardiomediastinal contours. IMPRESSION: Hyperinflation without acute airspace disease. Electronically Signed   By: Ulyses Jarred M.D.   On: 10/03/2020 20:40   DG HIP UNILAT WITH PELVIS 2-3 VIEWS LEFT  Result Date: 10/04/2020 CLINICAL DATA:  Hip pain EXAM: DG HIP (WITH OR WITHOUT PELVIS) 2-3V LEFT COMPARISON:  10/03/2020 FINDINGS: Right femoral head projects in joint. Pubic symphysis and rami are intact. Left hip hemiarthroplasty with cranial dislocation of the left femoral component. IMPRESSION: Left hip hemiarthroplasty with cranial dislocation of the left femoral component Electronically Signed   By: Donavan Foil M.D.   On: 10/04/2020 17:42   DG Hip Unilat W or Wo Pelvis 2-3 Views Left  Result Date: 10/03/2020 CLINICAL DATA:  Postreduction EXAM: DG HIP (WITH OR WITHOUT PELVIS) 2-3V LEFT COMPARISON:  10/03/2020 FINDINGS:  Interval reduction of the previously seen dislocated left hip replacement. Normal alignment. No fracture. IMPRESSION: Interval reduction.  No fracture Electronically Signed   By: Rolm Baptise M.D.   On: 10/03/2020 22:17   DG Hip Unilat W or Wo Pelvis 2-3 Views Left  Result Date: 10/03/2020 CLINICAL DATA:  80 year old female with left hip pain. EXAM: DG HIP (WITH OR WITHOUT PELVIS) 2-3V LEFT COMPARISON:  Left hip radiograph dated 09/30/2020. FINDINGS: There is a left hip arthroplasty. There is superior dislocation of the arthroplasty of side of the and abdomen. No definite acute fracture identified. The bones are osteopenic. The soft tissues are grossly unremarkable. Cutaneous clips noted over the left hip. IMPRESSION: Superior dislocation of the left hip arthroplasty. Electronically Signed   By: Anner Crete M.D.   On: 10/03/2020 20:38    Positive ROS: All other systems have been reviewed and were otherwise negative with the exception of those mentioned in the HPI and as above.  Physical Exam: General: Alert, no acute distress Cardiovascular: No pedal edema Respiratory: No cyanosis, no use of accessory musculature GI: No organomegaly, abdomen is soft and non-tender Skin: No lesions in the area of chief complaint Neurologic: Sensation intact distally Psychiatric: Patient is competent for consent with normal mood and affect Lymphatic: No axillary or cervical lymphadenopathy  MUSCULOSKELETAL: In the emergency room today the hip was reduced and leg lengths are equal.  The patient was in a knee immobilizer.  Assessment: Multiple recurrent dislocations left hip hemiarthroplasty secondary to severe Parkinson's disease  Plan: I believe attempts to resolve this problem with a total hip arthroplasty generally only viable treatment at this point now.    Park Breed, MD [phone]   10/04/2020 10:12 PM

## 2020-10-04 NOTE — Consult Note (Signed)
ORTHOPAEDIC CONSULTATION  REQUESTING PHYSICIAN: Lorella Nimrod, MD  Chief Complaint: Left hemiarthroplasty dislocation  HPI: Jasmine Colon is a 80 y.o. female with Parkinson's disease and dyskinesia who is status post left hip hemiarthroplasty on 09/21/2020 for a femoral neck fracture.  Patient returned to the hospital overnight with recurrent prosthetic dislocation.  Her initial prosthetic dislocation was on 09/27/2020.  She has undergone 2 closed reductions 1 in the OR and 1 in the ER until presentation last night.  Patient was again reduced by the ER physician overnight and placed in a knee immobilizer.  She does have a hip abduction brace.  Patient has been admitted to the hospitalist service for pain control.  The plan was to proceed with a revision left hip surgery on Monday.  Upon arrival to the floor patient complained again of left hip pain.  The patient's nurse contacted me and an x-ray of the left hip was ordered and the patient was found to again have redislocated her left hip.    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 Hormone positive, HER 2 no amplified   Mitral valve disorder    Osteoarthritis    Osteoporosis    Parkinson's disease ([place]) 2012   Personal history of radiation therapy 2013   right breast ca with lumpectomy and rad tx   Past Surgical History:  Procedure Laterality Date   BREAST BIOPSY Right 10/26/2011   invasive mammary carcinoma and DCIS   BREAST BIOPSY Left 01/09/2014   retroaerolar bx with Dr. Jamal Collin. Benign   BREAST LUMPECTOMY Right 11/13/2011   invasive mammary ca, DCIS, LCIS in specimen. Clear margins.  LN negative   BREAST SURGERY Right 2013   wide excision   CESAREAN SECTION  31 years ago   COLONOSCOPY  2008   HIP ARTHROPLASTY Left 09/21/2020   Procedure: ARTHROPLASTY  BIPOLAR HIP (HEMIARTHROPLASTY);  Surgeon: Earnestine Leys, MD;  Location: [facility] ORS;  Service: Orthopedics;  Laterality: Left;   HIP CLOSED REDUCTION Left 09/27/2020   Procedure: CLOSED REDUCTION HIP;  Surgeon: Renee Harder, MD;  Location: [facility] ORS;  Service: Orthopedics;  Laterality: Left;   Social History   Socioeconomic History   Marital status: Married    Spouse name: Jori Moll   Number of children: 2   Years of education: college   Highest education level: Not on file  Occupational History   Occupation: part time    Comment: Product manager day week,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  Tobacco Use   Smoking status: Every Day    Packs/day: 0.50    Years: 45.00    Pack years: 22.50    Types: Cigarettes   Smokeless tobacco: Never  Substance and Sexual Activity   Alcohol use: No   Drug use: No   Sexual activity: Not on file  Other Topics Concern   Not on file  Social History Narrative   Patient is right handed, resides with husband   Social Determinants of Radio broadcast assistant Strain: Not on file  Food Insecurity: Not on file  Transportation Needs: Not on file  Physical Activity: Not on file  Stress: Not on file  Social Connections: Not on file   Family History  Problem Relation Age of Onset   Stroke Mother    Heart  failure Father    Heart disease Brother        bypass   Cancer Maternal Aunt        breast    Breast cancer Maternal Aunt    Cancer Maternal Aunt        breast   Breast cancer Maternal Aunt    Allergies  Allergen Reactions   Codeine     Feels weird   Pseudoephedrine     Other reaction(s): Dizziness and giddiness (finding)   Prior to Admission medications   Medication Sig Start Date End Date Taking? Authorizing Provider  acetaminophen (TYLENOL) 650 MG CR tablet Take 650 mg by mouth every 8 (eight) hours as needed for pain.   Yes [provider]  aspirin EC 325 MG tablet Take 1 tablet (325 mg total) by mouth 2 (two) times  daily for 14 days. 09/29/20 10/13/20 Yes Danford, Suann Larry, MD  Calcium Carbonate-Vitamin D (CALTRATE 600+D PO) Take 1 tablet by mouth daily.   Yes [provider]  carbidopa-levodopa (SINEMET IR) 25-100 MG tablet Take 1 tablet by mouth 6 (six) times daily. 09/24/20  Yes Fritzi Mandes, MD  cyanocobalamin 100 MCG tablet Take 100 mcg by mouth daily.   Yes [provider]  docusate sodium (COLACE) 100 MG capsule Take 1 capsule (100 mg total) by mouth 2 (two) times daily. Patient taking differently: Take 100 mg by mouth 2 (two) times daily as needed for mild constipation or moderate constipation. 09/24/20  Yes Fritzi Mandes, MD  ferrous sulfate 325 (65 FE) MG tablet Take 1 tablet (325 mg total) by mouth daily with breakfast. 09/25/20  Yes Fritzi Mandes, MD  Multiple Vitamins-Minerals (PRESERVISION AREDS PO) Take 1 capsule by mouth daily.   Yes [provider]  oxyCODONE (OXY IR/ROXICODONE) 5 MG immediate release tablet Take 1 tablet (5 mg total) by mouth every 4 (four) hours as needed for moderate pain. Patient taking differently: Take 5 mg by mouth every 4 (four) hours as needed for severe pain. 09/29/20  Yes Danford, Suann Larry, MD  rasagiline (AZILECT) 1 MG TABS tablet Take 1 tablet (1 mg total) by mouth daily. 09/25/20  Yes Fritzi Mandes, MD  aspirin EC 81 MG tablet Take 81 mg by mouth in the morning and at bedtime.    [provider]  cyanocobalamin 1000 MCG tablet Take 1,000 mcg by mouth daily. Patient not taking: Reported on 10/04/2020    [provider]  feeding supplement (ENSURE ENLIVE / ENSURE PLUS) LIQD Take 237 mLs by mouth 3 (three) times daily between meals. 09/29/20   Danford, Suann Larry, MD  fluticasone (FLONASE) 50 MCG/ACT nasal spray Place 2 sprays into both nostrils daily as needed for allergies or rhinitis.    [provider]  HYDROcodone-acetaminophen (NORCO/VICODIN) 5-325 MG tablet Take 1 tablet by mouth every 6 (six) hours as needed for  moderate pain. Patient not taking: No sig reported    [provider]  ibuprofen (ADVIL) 200 MG tablet Take 400 mg by mouth every 8 (eight) hours as needed (pain).    [provider]  loratadine (CLARITIN) 10 MG tablet Take 10 mg by mouth daily as needed for allergies.    [provider]  mirtazapine (REMERON) 30 MG tablet Take 30 mg by mouth at bedtime. 06/27/20   [provider]  sodium chloride (OCEAN) 0.65 % SOLN nasal spray Place 1 spray into both nostrils daily as needed (allergies).    [provider]   DG  Chest 1 View  Result Date: 10/03/2020 CLINICAL DATA:  Left hip pain EXAM: CHEST  1 VIEW COMPARISON:  09/30/2020 FINDINGS: The lungs are hyperinflated with diffuse interstitial prominence. No focal airspace consolidation or pulmonary edema. No pleural effusion or pneumothorax. Normal cardiomediastinal contours. IMPRESSION: Hyperinflation without acute airspace disease. Electronically Signed   By: Ulyses Jarred M.D.   On: 10/03/2020 20:40   DG HIP UNILAT WITH PELVIS 2-3 VIEWS LEFT  Result Date: 10/04/2020 CLINICAL DATA:  Hip pain EXAM: DG HIP (WITH OR WITHOUT PELVIS) 2-3V LEFT COMPARISON:  10/03/2020 FINDINGS: Right femoral head projects in joint. Pubic symphysis and rami are intact. Left hip hemiarthroplasty with cranial dislocation of the left femoral component. IMPRESSION: Left hip hemiarthroplasty with cranial dislocation of the left femoral component Electronically Signed   By: Donavan Foil M.D.   On: 10/04/2020 17:42   DG Hip Unilat W or Wo Pelvis 2-3 Views Left  Result Date: 10/03/2020 CLINICAL DATA:  Postreduction EXAM: DG HIP (WITH OR WITHOUT PELVIS) 2-3V LEFT COMPARISON:  10/03/2020 FINDINGS: Interval reduction of the previously seen dislocated left hip replacement. Normal alignment. No fracture. IMPRESSION: Interval reduction.  No fracture Electronically Signed   By: Rolm Baptise M.D.   On: 10/03/2020 22:17   DG Hip Unilat W or Wo  Pelvis 2-3 Views Left  Result Date: 10/03/2020 CLINICAL DATA:  80 year old female with left hip pain. EXAM: DG HIP (WITH OR WITHOUT PELVIS) 2-3V LEFT COMPARISON:  Left hip radiograph dated 09/30/2020. FINDINGS: There is a left hip arthroplasty. There is superior dislocation of the arthroplasty of side of the and abdomen. No definite acute fracture identified. The bones are osteopenic. The soft tissues are grossly unremarkable. Cutaneous clips noted over the left hip. IMPRESSION: Superior dislocation of the left hip arthroplasty. Electronically Signed   By: Anner Crete M.D.   On: 10/03/2020 20:38    Positive ROS: All other systems have been reviewed and were otherwise negative with the exception of those mentioned in the HPI and as above.  Physical Exam: General: Alert, no acute distress.  Patient is lying in her hospital bed.  Her daughter is at the bedside.  Patient is noted to have dyskinesias with frequent involuntary movements of her lower extremities.  MUSCULOSKELETAL: Patient's lower legs are crossed.  There is dyskinetic movement to the lower extremities.  She is neurovascular intact but has shortening of the left lower extremity.  Assessment: Recurrent left hemiarthroplasty prosthetic dislocations.  Plan: I reviewed the patient's plain x-rays demonstrate the left hemiarthroplasty superiorly dislocated.  I explained the findings on x-ray to the patient and her daughter.  Given the recurrent instability I do not see any reason to attempt to close reduce the hip again as I did not expected to stay in.  Patient requested a hip abduction pillow which may help to reduce her left hip pain due to involuntary movements due to dyskinesias.  I have ordered this hip abduction pillow for her.  Patient will continue with current pain management.  Patient requested a Foley catheter which will also be ordered.  The plan is for the patient to undergo revision left arthroplasty surgery on Monday with Dr.  Harlow Mares.  Patient will be n.p.o. after midnight on Sunday night in preparation for surgery.  Patient will remain on bedrest until Monday.    Thornton Park, MD    10/04/2020 7:18 PM

## 2020-10-04 NOTE — ED Notes (Signed)
Pt sitting upright at foot of bed and had urinated in the floor, pulled IV out, and removed knee immobilizer. Additional staff called to assist to reposition patient.

## 2020-10-04 NOTE — Progress Notes (Signed)
Pt arrived from the ed legs crossed. Pt rolled knee immobilizer removed, skin checked and I noticed pt had what seemed to be a spastic leg cross. Pulled leg back and could feel it jerk in my hand. There is definite internal rotation. She says she has numbness and tingling. There is a softball size hematoma. Patient currently has no attending assigned or ortho. Notified Helene Kelp and Anitra awaiting call back

## 2020-10-04 NOTE — Progress Notes (Signed)
PROGRESS NOTE    Jasmine Colon  JJH:[id number] DOB: 1940-08-21 DOA: 10/03/2020 PCP: Dion Body, MD   Brief Narrative: Taken from H&P. Jasmine Colon is a 80 y.o. female with medical history significant for Parkinson's disease, status post left hip hemiarthroplasty on 09/21/20 following a fall complicated by periprosthetic dislocation requiring closed reduction in the OR on 09/27/20.   Patient presents to the ER via EMS with complaints of left hip pain that occurred suddenly while she was sitting.  Found to have another superior dislocation of left hip arthroplasty. Patient is unable to keep her knee straight as advised by orthopedic. Orthopedic surgery is planning to take her to the OR on Monday for total hip arthroplasty.  Subjective: Patient was seen and examined today.  Pain was little improved with pain management.  Husband at bedside.  Patient and husband both are in agreeable to go for a total hip arthroplasty on Monday as told by orthopedic.  Assessment & Plan:   Principal Problem:   Hip dislocation, left (HCC) Active Problems:   Parkinson's disease ([place])   Malnutrition of moderate degree  Left Hip Dislocation.  Multiple dislocations of hemiarthroplasty.  Patient will need a total hip arthroplasty on Monday by orthopedic surgery. -Continue with pain management  Parkinson's disease. -Continue home dose of Sinemet and rasagiline.  Malnutrition of moderate degree (BMI 18) Continue nutritional supplements 3 times a day Maintain regular diet  Depression Continue mirtazapine  Objective: Vitals:   10/04/20 0557 10/04/20 0800 10/04/20 1200 10/04/20 1423  BP:  132/80 115/73 119/63  Pulse:  94 87 92  Resp:  18 18 16   Temp:      TempSrc:      SpO2: 97% 95% 95% 92%  Weight:      Height:       No intake or output data in the 24 hours ending 10/04/20 1638 Filed Weights   10/03/20 1929  Weight: 49.9 kg    Examination:  General exam: Frail elderly lady,  appears calm and comfortable  Respiratory system: Clear to auscultation. Respiratory effort normal. Cardiovascular system: S1 & S2 heard, RRR.  Gastrointestinal system: Soft, nontender, nondistended, bowel sounds positive. Central nervous system: Alert and oriented. No focal neurological deficits. Extremities: No edema, no cyanosis, pulses intact and symmetrical.  Left leg with brace. Psychiatry: Judgement and insight appear normal.    DVT prophylaxis: Lovenox Code Status: Full Family Communication: Discussed with husband at bedside Disposition Plan:  Status is: Inpatient  Remains inpatient appropriate because:Inpatient level of care appropriate due to severity of illness  Dispo: The patient is from: SNF              Anticipated d/c is to: SNF              Patient currently is not medically stable to d/c.   Difficult to place patient No              Level of care: Med-Surg  All the records are reviewed and case discussed with Care Management/Social Worker. Management plans discussed with the patient, nursing and they are in agreement.  Consultants:  Orthopedic  Procedures:  Antimicrobials:   Data Reviewed: I have personally reviewed following labs and imaging studies  CBC: Recent Labs  Lab 09/28/20 0503 09/28/20 1424 09/29/20 0533 09/30/20 1133 10/03/20 2010 10/04/20 0556  WBC 9.5  --  9.6 12.2* 10.2 10.8*  NEUTROABS  --   --   --  8.5* 7.2  --  HGB 6.9* 7.7* 8.4* 9.5* 9.4* 9.1*  HCT 20.6* 22.2* 24.7* 28.3* 28.5* 27.3*  MCV 95.4  --  95.7 98.6 98.3 98.6  PLT 376  --  399 461* 585* 656*   Basic Metabolic Panel: Recent Labs  Lab 09/28/20 0503 09/29/20 0533 09/30/20 1133 10/03/20 2050 10/04/20 0556  NA 137 136 136 135 134*  K 4.3 3.7 4.8 4.1 3.9  CL 106 107 106 104 102  CO2 26 26 22 24 28   GLUCOSE 105* 97 88 97 93  BUN 20 17 19 13 13   CREATININE 0.76 0.69 0.71 0.61 0.71  CALCIUM 7.9* 7.8* 8.3* 8.4* 8.1*   GFR: Estimated Creatinine Clearance: 46.4  mL/min (by C-G formula based on SCr of 0.71 mg/dL). Liver Function Tests: No results for input(s): AST, ALT, ALKPHOS, BILITOT, PROT, ALBUMIN in the last 168 hours. No results for input(s): LIPASE, AMYLASE in the last 168 hours. No results for input(s): AMMONIA in the last 168 hours. Coagulation Profile: No results for input(s): INR, PROTIME in the last 168 hours. Cardiac Enzymes: No results for input(s): CKTOTAL, CKMB, CKMBINDEX, TROPONINI in the last 168 hours. BNP (last 3 results) No results for input(s): PROBNP in the last 8760 hours. HbA1C: No results for input(s): HGBA1C in the last 72 hours. CBG: No results for input(s): GLUCAP in the last 168 hours. Lipid Profile: No results for input(s): CHOL, HDL, LDLCALC, TRIG, CHOLHDL, LDLDIRECT in the last 72 hours. Thyroid Function Tests: No results for input(s): TSH, T4TOTAL, FREET4, T3FREE, THYROIDAB in the last 72 hours. Anemia Panel: No results for input(s): VITAMINB12, FOLATE, FERRITIN, TIBC, IRON, RETICCTPCT in the last 72 hours. Sepsis Labs: Recent Labs  Lab 09/30/20 1133  PROCALCITON 0.11    Recent Results (from the past 240 hour(s))  Resp Panel by RT-PCR (Flu A&B, Covid) Nasopharyngeal Swab     Status: None   Collection Time: 09/27/20 12:54 AM   Specimen: Nasopharyngeal Swab; Nasopharyngeal(NP) swabs in vial transport medium  Result Value Ref Range Status   SARS Coronavirus 2 by RT PCR NEGATIVE NEGATIVE Final    Comment: (NOTE) SARS-CoV-2 target nucleic acids are NOT DETECTED.  The SARS-CoV-2 RNA is generally detectable in upper respiratory specimens during the acute phase of infection. The lowest concentration of SARS-CoV-2 viral copies this assay can detect is 138 copies/mL. A negative result does not preclude SARS-Cov-2 infection and should not be used as the sole basis for treatment or other patient management decisions. A negative result may occur with  improper specimen collection/handling, submission of specimen  other than nasopharyngeal swab, presence of viral mutation(s) within the areas targeted by this assay, and inadequate number of viral copies(<138 copies/mL). A negative result must be combined with clinical observations, patient history, and epidemiological information. The expected result is Negative.  Fact Sheet for Patients:  EntrepreneurPulse.com.au  Fact Sheet for Healthcare Providers:  IncredibleEmployment.be  This test is no t yet approved or cleared by the [place] FDA and  has been authorized for detection and/or diagnosis of SARS-CoV-2 by FDA under an Emergency Use Authorization (EUA). This EUA will remain  in effect (meaning this test can be used) for the duration of the COVID-19 declaration under Section 564(b)(1) of the Act, 21 U.S.C.section 360bbb-3(b)(1), unless the authorization is terminated  or revoked sooner.       Influenza A by PCR NEGATIVE NEGATIVE Final   Influenza B by PCR NEGATIVE NEGATIVE Final    Comment: (NOTE) The Xpert Xpress SARS-CoV-2/FLU/RSV plus assay is intended as an  aid in the diagnosis of influenza from Nasopharyngeal swab specimens and should not be used as a sole basis for treatment. Nasal washings and aspirates are unacceptable for Xpert Xpress SARS-CoV-2/FLU/RSV testing.  Fact Sheet for Patients: EntrepreneurPulse.com.au  Fact Sheet for Healthcare Providers: IncredibleEmployment.be  This test is not yet approved or cleared by the [place] FDA and has been authorized for detection and/or diagnosis of SARS-CoV-2 by FDA under an Emergency Use Authorization (EUA). This EUA will remain in effect (meaning this test can be used) for the duration of the COVID-19 declaration under Section 564(b)(1) of the Act, 21 U.S.C. section 360bbb-3(b)(1), unless the authorization is terminated or revoked.  Performed at [facility], [address]  [place], [place] [address]   Surgical PCR screen     Status: None   Collection Time: 09/27/20  5:44 AM   Specimen: Nasal Mucosa; Nasal Swab  Result Value Ref Range Status   MRSA, PCR NEGATIVE NEGATIVE Final   Staphylococcus aureus NEGATIVE NEGATIVE Final    Comment: (NOTE) The Xpert SA Assay (FDA approved for NASAL specimens in patients 61 years of age and older), is one component of a comprehensive surveillance program. It is not intended to diagnose infection nor to guide or monitor treatment. Performed at [facility], [place], [place] [address]   Resp Panel by RT-PCR (Flu A&B, Covid) Nasopharyngeal Swab     Status: None   Collection Time: 09/30/20 11:33 AM   Specimen: Nasopharyngeal Swab; Nasopharyngeal(NP) swabs in vial transport medium  Result Value Ref Range Status   SARS Coronavirus 2 by RT PCR NEGATIVE NEGATIVE Final    Comment: (NOTE) SARS-CoV-2 target nucleic acids are NOT DETECTED.  The SARS-CoV-2 RNA is generally detectable in upper respiratory specimens during the acute phase of infection. The lowest concentration of SARS-CoV-2 viral copies this assay can detect is 138 copies/mL. A negative result does not preclude SARS-Cov-2 infection and should not be used as the sole basis for treatment or other patient management decisions. A negative result may occur with  improper specimen collection/handling, submission of specimen other than nasopharyngeal swab, presence of viral mutation(s) within the areas targeted by this assay, and inadequate number of viral copies(<138 copies/mL). A negative result must be combined with clinical observations, patient history, and epidemiological information. The expected result is Negative.  Fact Sheet for Patients:  EntrepreneurPulse.com.au  Fact Sheet for Healthcare Providers:  IncredibleEmployment.be  This test is no t yet approved or cleared by the [place] FDA and   has been authorized for detection and/or diagnosis of SARS-CoV-2 by FDA under an Emergency Use Authorization (EUA). This EUA will remain  in effect (meaning this test can be used) for the duration of the COVID-19 declaration under Section 564(b)(1) of the Act, 21 U.S.C.section 360bbb-3(b)(1), unless the authorization is terminated  or revoked sooner.       Influenza A by PCR NEGATIVE NEGATIVE Final   Influenza B by PCR NEGATIVE NEGATIVE Final    Comment: (NOTE) The Xpert Xpress SARS-CoV-2/FLU/RSV plus assay is intended as an aid in the diagnosis of influenza from Nasopharyngeal swab specimens and should not be used as a sole basis for treatment. Nasal washings and aspirates are unacceptable for Xpert Xpress SARS-CoV-2/FLU/RSV testing.  Fact Sheet for Patients: EntrepreneurPulse.com.au  Fact Sheet for Healthcare Providers: IncredibleEmployment.be  This test is not yet approved or cleared by the [place] FDA and has been authorized for detection and/or diagnosis of SARS-CoV-2 by FDA under an Emergency  Use Authorization (EUA). This EUA will remain in effect (meaning this test can be used) for the duration of the COVID-19 declaration under Section 564(b)(1) of the Act, 21 U.S.C. section 360bbb-3(b)(1), unless the authorization is terminated or revoked.  Performed at [facility], [place], [place] [address]   SARS CORONAVIRUS 2 (TAT 6-24 HRS) Nasopharyngeal Nasopharyngeal Swab     Status: None   Collection Time: 10/03/20 11:50 PM   Specimen: Nasopharyngeal Swab  Result Value Ref Range Status   SARS Coronavirus 2 NEGATIVE NEGATIVE Final    Comment: (NOTE) SARS-CoV-2 target nucleic acids are NOT DETECTED.  The SARS-CoV-2 RNA is generally detectable in upper and lower respiratory specimens during the acute phase of infection. Negative results do not preclude SARS-CoV-2 infection, do not rule out co-infections  with other pathogens, and should not be used as the sole basis for treatment or other patient management decisions. Negative results must be combined with clinical observations, patient history, and epidemiological information. The expected result is Negative.  Fact Sheet for Patients: SugarRoll.be  Fact Sheet for Healthcare Providers: https://www.woods-mathews.com/  This test is not yet approved or cleared by the [place] FDA and  has been authorized for detection and/or diagnosis of SARS-CoV-2 by FDA under an Emergency Use Authorization (EUA). This EUA will remain  in effect (meaning this test can be used) for the duration of the COVID-19 declaration under Se ction 564(b)(1) of the Act, 21 U.S.C. section 360bbb-3(b)(1), unless the authorization is terminated or revoked sooner.  Performed at [facility], [place] [address], [place], [place] [address]      Radiology Studies: DG Chest 1 View  Result Date: 10/03/2020 CLINICAL DATA:  Left hip pain EXAM: CHEST  1 VIEW COMPARISON:  09/30/2020 FINDINGS: The lungs are hyperinflated with diffuse interstitial prominence. No focal airspace consolidation or pulmonary edema. No pleural effusion or pneumothorax. Normal cardiomediastinal contours. IMPRESSION: Hyperinflation without acute airspace disease. Electronically Signed   By: Ulyses Jarred M.D.   On: 10/03/2020 20:40   DG Hip Unilat W or Wo Pelvis 2-3 Views Left  Result Date: 10/03/2020 CLINICAL DATA:  Postreduction EXAM: DG HIP (WITH OR WITHOUT PELVIS) 2-3V LEFT COMPARISON:  10/03/2020 FINDINGS: Interval reduction of the previously seen dislocated left hip replacement. Normal alignment. No fracture. IMPRESSION: Interval reduction.  No fracture Electronically Signed   By: Rolm Baptise M.D.   On: 10/03/2020 22:17   DG Hip Unilat W or Wo Pelvis 2-3 Views Left  Result Date: 10/03/2020 CLINICAL DATA:  80 year old female with left hip pain.  EXAM: DG HIP (WITH OR WITHOUT PELVIS) 2-3V LEFT COMPARISON:  Left hip radiograph dated 09/30/2020. FINDINGS: There is a left hip arthroplasty. There is superior dislocation of the arthroplasty of side of the and abdomen. No definite acute fracture identified. The bones are osteopenic. The soft tissues are grossly unremarkable. Cutaneous clips noted over the left hip. IMPRESSION: Superior dislocation of the left hip arthroplasty. Electronically Signed   By: Anner Crete M.D.   On: 10/03/2020 20:38    Scheduled Meds:  carbidopa-levodopa  1 tablet Oral 6 X Daily   enoxaparin (LOVENOX) injection  40 mg Subcutaneous Q24H   feeding supplement  237 mL Oral TID BM   ferrous sulfate  325 mg Oral Q breakfast   mirtazapine  30 mg Oral QHS   multivitamin-lutein   Oral Daily   rasagiline  1 mg Oral Daily   sodium chloride flush  3 mL Intravenous Q12H  cyanocobalamin  100 mcg Oral Daily   Continuous Infusions:  sodium chloride       LOS: 0 days   Time spent: 40 minutes. More than 50% of the time was spent in counseling/coordination of care  Lorella Nimrod, MD [facility]  If 7PM-7AM, please contact night-coverage [email or web address]  10/04/2020, 4:38 PM   This record has been created using Systems analyst. Errors have been sought and corrected,but may not always be located. Such creation errors do not reflect on the standard of care.

## 2020-10-04 NOTE — ED Notes (Signed)
Secure chat sent to Dr. Sidney Ace as well re: how patient was found and if a repeat hip xray is needed. Waiting for response.

## 2020-10-04 NOTE — ED Notes (Addendum)
Pt assisted to bed, bed alarm in place. Pt denies any pain at this time, states she just wants to get a good night's rest.  Advised patient I would be back in a little while to restart her IV and draw AM labs.   Knee immobilizer replaced when patient returned to bed, pt given warm blanket as well.

## 2020-10-04 NOTE — ED Notes (Signed)
NP Olena Heckle here to evaluate patient.

## 2020-10-04 NOTE — ED Notes (Signed)
Pt assisted to recline in bed, knee immobilizer reapplied to the LLE. DP pulses 2+ bilaterally, no shortening or rotation of the LLE. Pt denies falling.

## 2020-10-04 NOTE — Progress Notes (Addendum)
    BRIEF OVERNIGHT PROGRESS REPORT  Notified by RN that patient had apparently awoke and had removed IV, leg immobilizer and Stood up by bedside.She was confused but easily oriented. Staff replaced the device and returned her to bed. Patient's affected leg appears same length as the non-affected leg, without rotation to either side and maintains ability to push and pull toes on both feet. Capillary refill is equal on both feet. She denies any pain. She is awake and oriented to person, place, time, and self.   Discussed with Dr. Sidney Ace.   Gershon Cull MSNA ACNPC-AG Acute Care Nurse Practitioner [place]

## 2020-10-05 MED ORDER — CHLORHEXIDINE GLUCONATE CLOTH 2 % EX PADS
6.0000 | MEDICATED_PAD | Freq: Every day | CUTANEOUS | Status: DC
  Administered 2020-10-05 – 2020-10-10 (×4): 6 via TOPICAL

## 2020-10-05 NOTE — Plan of Care (Signed)
Patient had an uneventful shift. No changes in neurological and neurovascular assessments. Pain controlled with PRN medications. Vital signs within normal range.Denies any needs at this time. All Safety measures maintained. Care continues.  Problem: Education: Goal: Knowledge of General Education information will improve Description: Including pain rating scale, medication(s)/side effects and non-pharmacologic comfort measures Outcome: Progressing   Problem: Health Behavior/Discharge Planning: Goal: Ability to manage health-related needs will improve Outcome: Progressing   Problem: Clinical Measurements: Goal: Ability to maintain clinical measurements within normal limits will improve Outcome: Progressing Goal: Will remain free from infection Outcome: Progressing Goal: Diagnostic test results will improve Outcome: Progressing Goal: Respiratory complications will improve Outcome: Progressing Goal: Cardiovascular complication will be avoided Outcome: Progressing   Problem: Activity: Goal: Risk for activity intolerance will decrease Outcome: Progressing   Problem: Nutrition: Goal: Adequate nutrition will be maintained Outcome: Progressing   Problem: Coping: Goal: Level of anxiety will decrease Outcome: Progressing   Problem: Elimination: Goal: Will not experience complications related to bowel motility Outcome: Progressing Goal: Will not experience complications related to urinary retention Outcome: Progressing   Problem: Pain Managment: Goal: General experience of comfort will improve Outcome: Progressing   Problem: Safety: Goal: Ability to remain free from injury will improve Outcome: Progressing   Problem: Skin Integrity: Goal: Risk for impaired skin integrity will decrease Outcome: Progressing

## 2020-10-05 NOTE — Progress Notes (Signed)
  Subjective:  Patient seen today with her husband at the bedside.  Patient complains of feeling slightly sick to her stomach and wonders whether it may have been the Lovenox injection.  Otherwise she is not having severe left hip pain.  Objective:   VITALS:   Vitals:   10/04/20 2042 10/05/20 0047 10/05/20 0507 10/05/20 0843  BP: 98/61 115/71 124/78 121/75  Pulse: 85 84 79 79  Resp: 16 16 14 16   Temp: 98 F (36.7 C) 98.4 F (36.9 C) 98.2 F (36.8 C) 98.1 F (36.7 C)  TempSrc: Oral Oral Oral   SpO2: 95% 94% 98% 96%  Weight:      Height:        PHYSICAL EXAM: Left lower extremity: Patient has shortening of the left lower extremity.  She has palpable pedal pulses and can flex and extend her toes.   LABS  No results found for this or any previous visit (from the past 24 hour(s)).  DG Chest 1 View  Result Date: 10/03/2020 CLINICAL DATA:  Left hip pain EXAM: CHEST  1 VIEW COMPARISON:  09/30/2020 FINDINGS: The lungs are hyperinflated with diffuse interstitial prominence. No focal airspace consolidation or pulmonary edema. No pleural effusion or pneumothorax. Normal cardiomediastinal contours. IMPRESSION: Hyperinflation without acute airspace disease. Electronically Signed   By: Ulyses Jarred M.D.   On: 10/03/2020 20:40   DG HIP UNILAT WITH PELVIS 2-3 VIEWS LEFT  Result Date: 10/04/2020 CLINICAL DATA:  Hip pain EXAM: DG HIP (WITH OR WITHOUT PELVIS) 2-3V LEFT COMPARISON:  10/03/2020 FINDINGS: Right femoral head projects in joint. Pubic symphysis and rami are intact. Left hip hemiarthroplasty with cranial dislocation of the left femoral component. IMPRESSION: Left hip hemiarthroplasty with cranial dislocation of the left femoral component Electronically Signed   By: Donavan Foil M.D.   On: 10/04/2020 17:42   DG Hip Unilat W or Wo Pelvis 2-3 Views Left  Result Date: 10/03/2020 CLINICAL DATA:  Postreduction EXAM: DG HIP (WITH OR WITHOUT PELVIS) 2-3V LEFT COMPARISON:  10/03/2020  FINDINGS: Interval reduction of the previously seen dislocated left hip replacement. Normal alignment. No fracture. IMPRESSION: Interval reduction.  No fracture Electronically Signed   By: Rolm Baptise M.D.   On: 10/03/2020 22:17   DG Hip Unilat W or Wo Pelvis 2-3 Views Left  Result Date: 10/03/2020 CLINICAL DATA:  80 year old female with left hip pain. EXAM: DG HIP (WITH OR WITHOUT PELVIS) 2-3V LEFT COMPARISON:  Left hip radiograph dated 09/30/2020. FINDINGS: There is a left hip arthroplasty. There is superior dislocation of the arthroplasty of side of the and abdomen. No definite acute fracture identified. The bones are osteopenic. The soft tissues are grossly unremarkable. Cutaneous clips noted over the left hip. IMPRESSION: Superior dislocation of the left hip arthroplasty. Electronically Signed   By: Anner Crete M.D.   On: 10/03/2020 20:38    Assessment/Plan:     Principal Problem:   Hip dislocation, left (HCC) Active Problems:   Left hip pain   Parkinson's disease (HCC)   Malnutrition of moderate degree   Continue current pain management.  Patient will undergo a revision arthroplasty for her left hip given her recurrent dislocations following a left hip hemiarthroplasty for femoral neck hip fracture.  Continue Foley catheter.  Continue Lovenox for DVT prophylaxis.   Thornton Park , MD 10/05/2020, 12:00 PM

## 2020-10-05 NOTE — Progress Notes (Signed)
PROGRESS NOTE    Jasmine Colon  KGM:[id number] DOB: 05/15/1940 DOA: 10/03/2020 PCP: Dion Body, MD   Brief Narrative: Taken from H&P. Jasmine Colon is a 80 y.o. female with medical history significant for Parkinson's disease, status post left hip hemiarthroplasty on 09/21/20 following a fall complicated by periprosthetic dislocation requiring closed reduction in the OR on 09/27/20.   Patient presents to the ER via EMS with complaints of left hip pain that occurred suddenly while she was sitting.  Found to have another superior dislocation of left hip arthroplasty. Patient is unable to keep her knee straight as advised by orthopedic. Orthopedic surgery is planning to take her to the OR on Monday for total hip arthroplasty.  Apparently Foley catheter was placed by orthopedic to prevent excessive movements and they were requesting to keep it in till after the surgery on Monday.  Subjective: Patient was seen and examined today.  Husband at bedside.  Pain seems controlled, stating it is 2 out of 10 at this time.  Pain increases with movements around hip.  Assessment & Plan:   Principal Problem:   Hip dislocation, left (HCC) Active Problems:   Left hip pain   Parkinson's disease ([place])   Malnutrition of moderate degree  Left Hip Dislocation.  Multiple dislocations of hemiarthroplasty.  Patient will need a total hip arthroplasty on Monday by orthopedic surgery. -Continue with pain management  Parkinson's disease. -Continue home dose of Sinemet and rasagiline.  Malnutrition of moderate degree (BMI 18) Continue nutritional supplements 3 times a day Maintain regular diet  Depression Continue mirtazapine  Objective: Vitals:   10/04/20 2042 10/05/20 0047 10/05/20 0507 10/05/20 0843  BP: 98/61 115/71 124/78 121/75  Pulse: 85 84 79 79  Resp: 16 16 14 16   Temp: 98 F (36.7 C) 98.4 F (36.9 C) 98.2 F (36.8 C) 98.1 F (36.7 C)  TempSrc: Oral Oral Oral   SpO2: 95% 94%  98% 96%  Weight:      Height:        Intake/Output Summary (Last 24 hours) at 10/05/2020 1534 Last data filed at 10/05/2020 1410 Gross per 24 hour  Intake 180 ml  Output 700 ml  Net -520 ml   Filed Weights   10/03/20 1929  Weight: 49.9 kg    Examination:  General.  Frail elderly lady, in no acute distress. Pulmonary.  Lungs clear bilaterally, normal respiratory effort. CV.  Regular rate and rhythm, no JVD, rub or murmur. Abdomen.  Soft, nontender, nondistended, BS positive. CNS.  Alert and oriented .  No focal neurologic deficit. Extremities.  No edema, no cyanosis, pulses intact and symmetrical. Psychiatry.  Judgment and insight appears impaired.  DVT prophylaxis: Lovenox Code Status: Full Family Communication: Discussed with husband at bedside Disposition Plan:  Status is: Inpatient  Remains inpatient appropriate because:Inpatient level of care appropriate due to severity of illness  Dispo: The patient is from: SNF              Anticipated d/c is to: SNF              Patient currently is not medically stable to d/c.   Difficult to place patient No              Level of care: Med-Surg  All the records are reviewed and case discussed with Care Management/Social Worker. Management plans discussed with the patient, nursing and they are in agreement.  Consultants:  Orthopedic  Procedures:  Antimicrobials:   Data Reviewed: I  have personally reviewed following labs and imaging studies  CBC: Recent Labs  Lab 09/29/20 0533 09/30/20 1133 10/03/20 2010 10/04/20 0556  WBC 9.6 12.2* 10.2 10.8*  NEUTROABS  --  8.5* 7.2  --   HGB 8.4* 9.5* 9.4* 9.1*  HCT 24.7* 28.3* 28.5* 27.3*  MCV 95.7 98.6 98.3 98.6  PLT 399 461* 585* 533*    Basic Metabolic Panel: Recent Labs  Lab 09/29/20 0533 09/30/20 1133 10/03/20 2050 10/04/20 0556  NA 136 136 135 134*  K 3.7 4.8 4.1 3.9  CL 107 106 104 102  CO2 26 22 24 28   GLUCOSE 97 88 97 93  BUN 17 19 13 13   CREATININE 0.69  0.71 0.61 0.71  CALCIUM 7.8* 8.3* 8.4* 8.1*    GFR: Estimated Creatinine Clearance: 46.4 mL/min (by C-G formula based on SCr of 0.71 mg/dL). Liver Function Tests: No results for input(s): AST, ALT, ALKPHOS, BILITOT, PROT, ALBUMIN in the last 168 hours. No results for input(s): LIPASE, AMYLASE in the last 168 hours. No results for input(s): AMMONIA in the last 168 hours. Coagulation Profile: No results for input(s): INR, PROTIME in the last 168 hours. Cardiac Enzymes: No results for input(s): CKTOTAL, CKMB, CKMBINDEX, TROPONINI in the last 168 hours. BNP (last 3 results) No results for input(s): PROBNP in the last 8760 hours. HbA1C: No results for input(s): HGBA1C in the last 72 hours. CBG: No results for input(s): GLUCAP in the last 168 hours. Lipid Profile: No results for input(s): CHOL, HDL, LDLCALC, TRIG, CHOLHDL, LDLDIRECT in the last 72 hours. Thyroid Function Tests: No results for input(s): TSH, T4TOTAL, FREET4, T3FREE, THYROIDAB in the last 72 hours. Anemia Panel: No results for input(s): VITAMINB12, FOLATE, FERRITIN, TIBC, IRON, RETICCTPCT in the last 72 hours. Sepsis Labs: Recent Labs  Lab 09/30/20 1133  PROCALCITON 0.11     Recent Results (from the past 240 hour(s))  Resp Panel by RT-PCR (Flu A&B, Covid) Nasopharyngeal Swab     Status: None   Collection Time: 09/27/20 12:54 AM   Specimen: Nasopharyngeal Swab; Nasopharyngeal(NP) swabs in vial transport medium  Result Value Ref Range Status   SARS Coronavirus 2 by RT PCR NEGATIVE NEGATIVE Final    Comment: (NOTE) SARS-CoV-2 target nucleic acids are NOT DETECTED.  The SARS-CoV-2 RNA is generally detectable in upper respiratory specimens during the acute phase of infection. The lowest concentration of SARS-CoV-2 viral copies this assay can detect is 138 copies/mL. A negative result does not preclude SARS-Cov-2 infection and should not be used as the sole basis for treatment or other patient management decisions.  A negative result may occur with  improper specimen collection/handling, submission of specimen other than nasopharyngeal swab, presence of viral mutation(s) within the areas targeted by this assay, and inadequate number of viral copies(<138 copies/mL). A negative result must be combined with clinical observations, patient history, and epidemiological information. The expected result is Negative.  Fact Sheet for Patients:  EntrepreneurPulse.com.au  Fact Sheet for Healthcare Providers:  IncredibleEmployment.be  This test is no t yet approved or cleared by the [place] FDA and  has been authorized for detection and/or diagnosis of SARS-CoV-2 by FDA under an Emergency Use Authorization (EUA). This EUA will remain  in effect (meaning this test can be used) for the duration of the COVID-19 declaration under Section 564(b)(1) of the Act, 21 U.S.C.section 360bbb-3(b)(1), unless the authorization is terminated  or revoked sooner.       Influenza A by PCR NEGATIVE NEGATIVE Final   Influenza B  by PCR NEGATIVE NEGATIVE Final    Comment: (NOTE) The Xpert Xpress SARS-CoV-2/FLU/RSV plus assay is intended as an aid in the diagnosis of influenza from Nasopharyngeal swab specimens and should not be used as a sole basis for treatment. Nasal washings and aspirates are unacceptable for Xpert Xpress SARS-CoV-2/FLU/RSV testing.  Fact Sheet for Patients: EntrepreneurPulse.com.au  Fact Sheet for Healthcare Providers: IncredibleEmployment.be  This test is not yet approved or cleared by the [place] FDA and has been authorized for detection and/or diagnosis of SARS-CoV-2 by FDA under an Emergency Use Authorization (EUA). This EUA will remain in effect (meaning this test can be used) for the duration of the COVID-19 declaration under Section 564(b)(1) of the Act, 21 U.S.C. section 360bbb-3(b)(1), unless the authorization  is terminated or revoked.  Performed at [facility], [address], [place], [place] [address]   Surgical PCR screen     Status: None   Collection Time: 09/27/20  5:44 AM   Specimen: Nasal Mucosa; Nasal Swab  Result Value Ref Range Status   MRSA, PCR NEGATIVE NEGATIVE Final   Staphylococcus aureus NEGATIVE NEGATIVE Final    Comment: (NOTE) The Xpert SA Assay (FDA approved for NASAL specimens in patients 89 years of age and older), is one component of a comprehensive surveillance program. It is not intended to diagnose infection nor to guide or monitor treatment. Performed at [facility], [place], [place] [address]   Resp Panel by RT-PCR (Flu A&B, Covid) Nasopharyngeal Swab     Status: None   Collection Time: 09/30/20 11:33 AM   Specimen: Nasopharyngeal Swab; Nasopharyngeal(NP) swabs in vial transport medium  Result Value Ref Range Status   SARS Coronavirus 2 by RT PCR NEGATIVE NEGATIVE Final    Comment: (NOTE) SARS-CoV-2 target nucleic acids are NOT DETECTED.  The SARS-CoV-2 RNA is generally detectable in upper respiratory specimens during the acute phase of infection. The lowest concentration of SARS-CoV-2 viral copies this assay can detect is 138 copies/mL. A negative result does not preclude SARS-Cov-2 infection and should not be used as the sole basis for treatment or other patient management decisions. A negative result may occur with  improper specimen collection/handling, submission of specimen other than nasopharyngeal swab, presence of viral mutation(s) within the areas targeted by this assay, and inadequate number of viral copies(<138 copies/mL). A negative result must be combined with clinical observations, patient history, and epidemiological information. The expected result is Negative.  Fact Sheet for Patients:  EntrepreneurPulse.com.au  Fact Sheet for Healthcare Providers:   IncredibleEmployment.be  This test is no t yet approved or cleared by the [place] FDA and  has been authorized for detection and/or diagnosis of SARS-CoV-2 by FDA under an Emergency Use Authorization (EUA). This EUA will remain  in effect (meaning this test can be used) for the duration of the COVID-19 declaration under Section 564(b)(1) of the Act, 21 U.S.C.section 360bbb-3(b)(1), unless the authorization is terminated  or revoked sooner.       Influenza A by PCR NEGATIVE NEGATIVE Final   Influenza B by PCR NEGATIVE NEGATIVE Final    Comment: (NOTE) The Xpert Xpress SARS-CoV-2/FLU/RSV plus assay is intended as an aid in the diagnosis of influenza from Nasopharyngeal swab specimens and should not be used as a sole basis for treatment. Nasal washings and aspirates are unacceptable for Xpert Xpress SARS-CoV-2/FLU/RSV testing.  Fact Sheet for Patients: EntrepreneurPulse.com.au  Fact Sheet for Healthcare Providers: IncredibleEmployment.be  This test is not yet approved or cleared  by the [place] and has been authorized for detection and/or diagnosis of SARS-CoV-2 by FDA under an Emergency Use Authorization (EUA). This EUA will remain in effect (meaning this test can be used) for the duration of the COVID-19 declaration under Section 564(b)(1) of the Act, 21 U.S.C. section 360bbb-3(b)(1), unless the authorization is terminated or revoked.  Performed at [facility], [place], [place] [address]   SARS CORONAVIRUS 2 (TAT 6-24 HRS) Nasopharyngeal Nasopharyngeal Swab     Status: None   Collection Time: 10/03/20 11:50 PM   Specimen: Nasopharyngeal Swab  Result Value Ref Range Status   SARS Coronavirus 2 NEGATIVE NEGATIVE Final    Comment: (NOTE) SARS-CoV-2 target nucleic acids are NOT DETECTED.  The SARS-CoV-2 RNA is generally detectable in upper and lower respiratory specimens during  the acute phase of infection. Negative results do not preclude SARS-CoV-2 infection, do not rule out co-infections with other pathogens, and should not be used as the sole basis for treatment or other patient management decisions. Negative results must be combined with clinical observations, patient history, and epidemiological information. The expected result is Negative.  Fact Sheet for Patients: SugarRoll.be  Fact Sheet for Healthcare Providers: https://www.woods-mathews.com/  This test is not yet approved or cleared by the [place] FDA and  has been authorized for detection and/or diagnosis of SARS-CoV-2 by FDA under an Emergency Use Authorization (EUA). This EUA will remain  in effect (meaning this test can be used) for the duration of the COVID-19 declaration under Se ction 564(b)(1) of the Act, 21 U.S.C. section 360bbb-3(b)(1), unless the authorization is terminated or revoked sooner.  Performed at [facility], [place] [address] [place], [place] [address]       Radiology Studies: DG Chest 1 View  Result Date: 10/03/2020 CLINICAL DATA:  Left hip pain EXAM: CHEST  1 VIEW COMPARISON:  09/30/2020 FINDINGS: The lungs are hyperinflated with diffuse interstitial prominence. No focal airspace consolidation or pulmonary edema. No pleural effusion or pneumothorax. Normal cardiomediastinal contours. IMPRESSION: Hyperinflation without acute airspace disease. Electronically Signed   By: Ulyses Jarred M.D.   On: 10/03/2020 20:40   DG HIP UNILAT WITH PELVIS 2-3 VIEWS LEFT  Result Date: 10/04/2020 CLINICAL DATA:  Hip pain EXAM: DG HIP (WITH OR WITHOUT PELVIS) 2-3V LEFT COMPARISON:  10/03/2020 FINDINGS: Right femoral head projects in joint. Pubic symphysis and rami are intact. Left hip hemiarthroplasty with cranial dislocation of the left femoral component. IMPRESSION: Left hip hemiarthroplasty with cranial dislocation of the left femoral  component Electronically Signed   By: Donavan Foil M.D.   On: 10/04/2020 17:42   DG Hip Unilat W or Wo Pelvis 2-3 Views Left  Result Date: 10/03/2020 CLINICAL DATA:  Postreduction EXAM: DG HIP (WITH OR WITHOUT PELVIS) 2-3V LEFT COMPARISON:  10/03/2020 FINDINGS: Interval reduction of the previously seen dislocated left hip replacement. Normal alignment. No fracture. IMPRESSION: Interval reduction.  No fracture Electronically Signed   By: Rolm Baptise M.D.   On: 10/03/2020 22:17   DG Hip Unilat W or Wo Pelvis 2-3 Views Left  Result Date: 10/03/2020 CLINICAL DATA:  80 year old female with left hip pain. EXAM: DG HIP (WITH OR WITHOUT PELVIS) 2-3V LEFT COMPARISON:  Left hip radiograph dated 09/30/2020. FINDINGS: There is a left hip arthroplasty. There is superior dislocation of the arthroplasty of side of the and abdomen. No definite acute fracture identified. The bones are osteopenic. The soft tissues are grossly unremarkable. Cutaneous clips noted over the left  hip. IMPRESSION: Superior dislocation of the left hip arthroplasty. Electronically Signed   By: Anner Crete M.D.   On: 10/03/2020 20:38    Scheduled Meds:  carbidopa-levodopa  1 tablet Oral 6 X Daily   Chlorhexidine Gluconate Cloth  6 each Topical Daily   enoxaparin (LOVENOX) injection  40 mg Subcutaneous Q24H   feeding supplement  237 mL Oral TID BM   ferrous sulfate  325 mg Oral Q breakfast   mirtazapine  30 mg Oral QHS   multivitamin-lutein   Oral Daily   rasagiline  1 mg Oral Daily   sodium chloride flush  3 mL Intravenous Q12H   cyanocobalamin  100 mcg Oral Daily   Continuous Infusions:  sodium chloride       LOS: 1 day   Time spent: 35 minutes. More than 50% of the time was spent in counseling/coordination of care  Lorella Nimrod, MD [facility]  If 7PM-7AM, please contact night-coverage [email or web address]  10/05/2020, 3:34 PM   This record has been created using Systems analyst. Errors have  been sought and corrected,but may not always be located. Such creation errors do not reflect on the standard of care.

## 2020-10-06 NOTE — Progress Notes (Signed)
PROGRESS NOTE    Jasmine Colon  ZDG:[id number] DOB: January 20, 1941 DOA: 10/03/2020 PCP: Dion Body, MD   Brief Narrative: Taken from H&P. Jasmine Colon is a 80 y.o. female with medical history significant for Parkinson's disease, status post left hip hemiarthroplasty on 09/21/20 following a fall complicated by periprosthetic dislocation requiring closed reduction in the OR on 09/27/20.   Patient presents to the ER via EMS with complaints of left hip pain that occurred suddenly while she was sitting.  Found to have another superior dislocation of left hip arthroplasty. Patient is unable to keep her knee straight as advised by orthopedic. Orthopedic surgery is planning to take her to the OR on Monday for total hip arthroplasty.  Apparently Foley catheter was placed by orthopedic to prevent excessive movements and they were requesting to keep it in till after the surgery on Monday.  6/19:Patient accidentally pulled her catheter out last night resulted in some traumatic injury and hematuria. Currently on pure wick.  Subjective: Patient was seen and examined today.  Husband at bedside.  No new complaint.  She accidentally pulled her Foley catheter but does not remember doing it.  Some hematuria and pure wick. Waiting for her surgery tomorrow.  Assessment & Plan:   Principal Problem:   Hip dislocation, left (HCC) Active Problems:   Left hip pain   Parkinson's disease ([place])   Malnutrition of moderate degree  Left Hip Dislocation.  Multiple dislocations of hemiarthroplasty.  Patient will need a total hip arthroplasty on Monday by orthopedic surgery. -Continue with pain management  Gross hematuria.  Secondary to trauma with accidental pulling of Foley catheter by patient overnight. -Monitor hemoglobin-currently stable -Continue with pure wick-we will avoid Foley catheter.  Parkinson's disease. -Continue home dose of Sinemet and rasagiline.  Malnutrition of moderate degree  (BMI 18) Continue nutritional supplements 3 times a day Maintain regular diet  Depression Continue mirtazapine  Objective: Vitals:   10/05/20 1746 10/05/20 1957 10/06/20 0035 10/06/20 0456  BP: 124/87 106/72 103/65 121/77  Pulse: 85 80 82 82  Resp: 16 18 18 16   Temp: (!) 97.4 F (36.3 C) 98.4 F (36.9 C) 98.4 F (36.9 C) 98.3 F (36.8 C)  TempSrc:  Oral Oral Oral  SpO2: 97% 94% 93% 95%  Weight:      Height:        Intake/Output Summary (Last 24 hours) at 10/06/2020 0805 Last data filed at 10/06/2020 0503 Gross per 24 hour  Intake 180 ml  Output 1750 ml  Net -1570 ml    Filed Weights   10/03/20 1929  Weight: 49.9 kg    Examination:  General.  Frail and malnourished lady, in no acute distress. Pulmonary.  Lungs clear bilaterally, normal respiratory effort. CV.  Regular rate and rhythm, no JVD, rub or murmur. Abdomen.  Soft, nontender, nondistended, BS positive. CNS.  Alert and oriented .  No focal neurologic deficit. Extremities.  No edema, no cyanosis, pulses intact and symmetrical. Psychiatry.  Judgment and insight appears impaired.  DVT prophylaxis: Lovenox Code Status: Full Family Communication: Discussed with husband at bedside Disposition Plan:  Status is: Inpatient  Remains inpatient appropriate because:Inpatient level of care appropriate due to severity of illness  Dispo: The patient is from: SNF              Anticipated d/c is to: SNF              Patient currently is not medically stable to d/c.   Difficult to place  patient No              Level of care: Med-Surg  All the records are reviewed and case discussed with Care Management/Social Worker. Management plans discussed with the patient, nursing and they are in agreement.  Consultants:  Orthopedic  Procedures:  Antimicrobials:   Data Reviewed: I have personally reviewed following labs and imaging studies  CBC: Recent Labs  Lab 09/30/20 1133 10/03/20 2010 10/04/20 0556  WBC 12.2*  10.2 10.8*  NEUTROABS 8.5* 7.2  --   HGB 9.5* 9.4* 9.1*  HCT 28.3* 28.5* 27.3*  MCV 98.6 98.3 98.6  PLT 461* 585* 533*    Basic Metabolic Panel: Recent Labs  Lab 09/30/20 1133 10/03/20 2050 10/04/20 0556  NA 136 135 134*  K 4.8 4.1 3.9  CL 106 104 102  CO2 22 24 28   GLUCOSE 88 97 93  BUN 19 13 13   CREATININE 0.71 0.61 0.71  CALCIUM 8.3* 8.4* 8.1*    GFR: Estimated Creatinine Clearance: 46.4 mL/min (by C-G formula based on SCr of 0.71 mg/dL). Liver Function Tests: No results for input(s): AST, ALT, ALKPHOS, BILITOT, PROT, ALBUMIN in the last 168 hours. No results for input(s): LIPASE, AMYLASE in the last 168 hours. No results for input(s): AMMONIA in the last 168 hours. Coagulation Profile: No results for input(s): INR, PROTIME in the last 168 hours. Cardiac Enzymes: No results for input(s): CKTOTAL, CKMB, CKMBINDEX, TROPONINI in the last 168 hours. BNP (last 3 results) No results for input(s): PROBNP in the last 8760 hours. HbA1C: No results for input(s): HGBA1C in the last 72 hours. CBG: No results for input(s): GLUCAP in the last 168 hours. Lipid Profile: No results for input(s): CHOL, HDL, LDLCALC, TRIG, CHOLHDL, LDLDIRECT in the last 72 hours. Thyroid Function Tests: No results for input(s): TSH, T4TOTAL, FREET4, T3FREE, THYROIDAB in the last 72 hours. Anemia Panel: No results for input(s): VITAMINB12, FOLATE, FERRITIN, TIBC, IRON, RETICCTPCT in the last 72 hours. Sepsis Labs: Recent Labs  Lab 09/30/20 1133  PROCALCITON 0.11     Recent Results (from the past 240 hour(s))  Resp Panel by RT-PCR (Flu A&B, Covid) Nasopharyngeal Swab     Status: None   Collection Time: 09/27/20 12:54 AM   Specimen: Nasopharyngeal Swab; Nasopharyngeal(NP) swabs in vial transport medium  Result Value Ref Range Status   SARS Coronavirus 2 by RT PCR NEGATIVE NEGATIVE Final    Comment: (NOTE) SARS-CoV-2 target nucleic acids are NOT DETECTED.  The SARS-CoV-2 RNA is generally  detectable in upper respiratory specimens during the acute phase of infection. The lowest concentration of SARS-CoV-2 viral copies this assay can detect is 138 copies/mL. A negative result does not preclude SARS-Cov-2 infection and should not be used as the sole basis for treatment or other patient management decisions. A negative result may occur with  improper specimen collection/handling, submission of specimen other than nasopharyngeal swab, presence of viral mutation(s) within the areas targeted by this assay, and inadequate number of viral copies(<138 copies/mL). A negative result must be combined with clinical observations, patient history, and epidemiological information. The expected result is Negative.  Fact Sheet for Patients:  EntrepreneurPulse.com.au  Fact Sheet for Healthcare Providers:  IncredibleEmployment.be  This test is no t yet approved or cleared by the [place] FDA and  has been authorized for detection and/or diagnosis of SARS-CoV-2 by FDA under an Emergency Use Authorization (EUA). This EUA will remain  in effect (meaning this test can be used) for the duration of the  COVID-19 declaration under Section 564(b)(1) of the Act, 21 U.S.C.section 360bbb-3(b)(1), unless the authorization is terminated  or revoked sooner.       Influenza A by PCR NEGATIVE NEGATIVE Final   Influenza B by PCR NEGATIVE NEGATIVE Final    Comment: (NOTE) The Xpert Xpress SARS-CoV-2/FLU/RSV plus assay is intended as an aid in the diagnosis of influenza from Nasopharyngeal swab specimens and should not be used as a sole basis for treatment. Nasal washings and aspirates are unacceptable for Xpert Xpress SARS-CoV-2/FLU/RSV testing.  Fact Sheet for Patients: EntrepreneurPulse.com.au  Fact Sheet for Healthcare Providers: IncredibleEmployment.be  This test is not yet approved or cleared by the [place] FDA  and has been authorized for detection and/or diagnosis of SARS-CoV-2 by FDA under an Emergency Use Authorization (EUA). This EUA will remain in effect (meaning this test can be used) for the duration of the COVID-19 declaration under Section 564(b)(1) of the Act, 21 U.S.C. section 360bbb-3(b)(1), unless the authorization is terminated or revoked.  Performed at [facility], [address], [place], [place] [address]   Surgical PCR screen     Status: None   Collection Time: 09/27/20  5:44 AM   Specimen: Nasal Mucosa; Nasal Swab  Result Value Ref Range Status   MRSA, PCR NEGATIVE NEGATIVE Final   Staphylococcus aureus NEGATIVE NEGATIVE Final    Comment: (NOTE) The Xpert SA Assay (FDA approved for NASAL specimens in patients 26 years of age and older), is one component of a comprehensive surveillance program. It is not intended to diagnose infection nor to guide or monitor treatment. Performed at [facility], [place], [place] [address]   Resp Panel by RT-PCR (Flu A&B, Covid) Nasopharyngeal Swab     Status: None   Collection Time: 09/30/20 11:33 AM   Specimen: Nasopharyngeal Swab; Nasopharyngeal(NP) swabs in vial transport medium  Result Value Ref Range Status   SARS Coronavirus 2 by RT PCR NEGATIVE NEGATIVE Final    Comment: (NOTE) SARS-CoV-2 target nucleic acids are NOT DETECTED.  The SARS-CoV-2 RNA is generally detectable in upper respiratory specimens during the acute phase of infection. The lowest concentration of SARS-CoV-2 viral copies this assay can detect is 138 copies/mL. A negative result does not preclude SARS-Cov-2 infection and should not be used as the sole basis for treatment or other patient management decisions. A negative result may occur with  improper specimen collection/handling, submission of specimen other than nasopharyngeal swab, presence of viral mutation(s) within the areas targeted by this assay, and inadequate  number of viral copies(<138 copies/mL). A negative result must be combined with clinical observations, patient history, and epidemiological information. The expected result is Negative.  Fact Sheet for Patients:  EntrepreneurPulse.com.au  Fact Sheet for Healthcare Providers:  IncredibleEmployment.be  This test is no t yet approved or cleared by the [place] FDA and  has been authorized for detection and/or diagnosis of SARS-CoV-2 by FDA under an Emergency Use Authorization (EUA). This EUA will remain  in effect (meaning this test can be used) for the duration of the COVID-19 declaration under Section 564(b)(1) of the Act, 21 U.S.C.section 360bbb-3(b)(1), unless the authorization is terminated  or revoked sooner.       Influenza A by PCR NEGATIVE NEGATIVE Final   Influenza B by PCR NEGATIVE NEGATIVE Final    Comment: (NOTE) The Xpert Xpress SARS-CoV-2/FLU/RSV plus assay is intended as an aid in the diagnosis of influenza from Nasopharyngeal swab specimens and should not be used as a  sole basis for treatment. Nasal washings and aspirates are unacceptable for Xpert Xpress SARS-CoV-2/FLU/RSV testing.  Fact Sheet for Patients: EntrepreneurPulse.com.au  Fact Sheet for Healthcare Providers: IncredibleEmployment.be  This test is not yet approved or cleared by the [place] FDA and has been authorized for detection and/or diagnosis of SARS-CoV-2 by FDA under an Emergency Use Authorization (EUA). This EUA will remain in effect (meaning this test can be used) for the duration of the COVID-19 declaration under Section 564(b)(1) of the Act, 21 U.S.C. section 360bbb-3(b)(1), unless the authorization is terminated or revoked.  Performed at [facility], [place], [place] [address]   SARS CORONAVIRUS 2 (TAT 6-24 HRS) Nasopharyngeal Nasopharyngeal Swab     Status: None   Collection  Time: 10/03/20 11:50 PM   Specimen: Nasopharyngeal Swab  Result Value Ref Range Status   SARS Coronavirus 2 NEGATIVE NEGATIVE Final    Comment: (NOTE) SARS-CoV-2 target nucleic acids are NOT DETECTED.  The SARS-CoV-2 RNA is generally detectable in upper and lower respiratory specimens during the acute phase of infection. Negative results do not preclude SARS-CoV-2 infection, do not rule out co-infections with other pathogens, and should not be used as the sole basis for treatment or other patient management decisions. Negative results must be combined with clinical observations, patient history, and epidemiological information. The expected result is Negative.  Fact Sheet for Patients: SugarRoll.be  Fact Sheet for Healthcare Providers: https://www.woods-mathews.com/  This test is not yet approved or cleared by the [place] FDA and  has been authorized for detection and/or diagnosis of SARS-CoV-2 by FDA under an Emergency Use Authorization (EUA). This EUA will remain  in effect (meaning this test can be used) for the duration of the COVID-19 declaration under Se ction 564(b)(1) of the Act, 21 U.S.C. section 360bbb-3(b)(1), unless the authorization is terminated or revoked sooner.  Performed at [facility], [place] [address], [place], [place] [address]       Radiology Studies: DG HIP UNILAT WITH PELVIS 2-3 VIEWS LEFT  Result Date: 10/04/2020 CLINICAL DATA:  Hip pain EXAM: DG HIP (WITH OR WITHOUT PELVIS) 2-3V LEFT COMPARISON:  10/03/2020 FINDINGS: Right femoral head projects in joint. Pubic symphysis and rami are intact. Left hip hemiarthroplasty with cranial dislocation of the left femoral component. IMPRESSION: Left hip hemiarthroplasty with cranial dislocation of the left femoral component Electronically Signed   By: Donavan Foil M.D.   On: 10/04/2020 17:42    Scheduled Meds:  carbidopa-levodopa  1 tablet Oral 6 X Daily    Chlorhexidine Gluconate Cloth  6 each Topical Daily   enoxaparin (LOVENOX) injection  40 mg Subcutaneous Q24H   feeding supplement  237 mL Oral TID BM   ferrous sulfate  325 mg Oral Q breakfast   mirtazapine  30 mg Oral QHS   multivitamin-lutein   Oral Daily   rasagiline  1 mg Oral Daily   sodium chloride flush  3 mL Intravenous Q12H   cyanocobalamin  100 mcg Oral Daily   Continuous Infusions:  sodium chloride       LOS: 2 days   Time spent: 30 minutes. More than 50% of the time was spent in counseling/coordination of care  Lorella Nimrod, MD [facility]  If 7PM-7AM, please contact night-coverage [email or web address]  10/06/2020, 8:05 AM   This record has been created using Systems analyst. Errors have been sought and corrected,but may not always be located. Such creation errors do not reflect on the standard of care.

## 2020-10-06 NOTE — Progress Notes (Signed)
Pts cathater has been out for a while. Her bed had dry and wet urine soaked through the pad and the bottom sheet. Blood was noted in her foley and in tubing. Upon examination the tip is all that was left in the intrace of urethra not in bladder at all. Patient states she remebers something huring reall bad down there early last night. Tried to put in a new foley but the patient couldn't tolerate the new insertion . A external cath was placed

## 2020-10-06 NOTE — Progress Notes (Signed)
  Subjective: Patient was seen at approxi-1 PM today with her husband at the bedside.  She is lying supine in the bed and stated that she was not having severe pain in the left hip.  She expressed being anxious about surgery tomorrow.  Objective:   VITALS:   Vitals:   10/06/20 0035 10/06/20 0456 10/06/20 0811 10/06/20 1643  BP: 103/65 121/77 111/71 107/73  Pulse: 82 82 79 92  Resp: 18 16 18 18   Temp: 98.4 F (36.9 C) 98.3 F (36.8 C) 97.7 F (36.5 C) 97.7 F (36.5 C)  TempSrc: Oral Oral    SpO2: 93% 95% 94% 96%  Weight:      Height:        PHYSICAL EXAM: Left lower extremity: Patient has positive swelling and ecchymosis over the left hip but her thigh compartments are soft and compressible.  Her incision is intact.  Patient has shortening and internal rotation of the left lower extremity compared to the right.  She has palpable pedal pulses, intact sensation light touch and intact motor function distally.   LABS  No results found for this or any previous visit (from the past 24 hour(s)).  DG HIP UNILAT WITH PELVIS 2-3 VIEWS LEFT  Result Date: 10/04/2020 CLINICAL DATA:  Hip pain EXAM: DG HIP (WITH OR WITHOUT PELVIS) 2-3V LEFT COMPARISON:  10/03/2020 FINDINGS: Right femoral head projects in joint. Pubic symphysis and rami are intact. Left hip hemiarthroplasty with cranial dislocation of the left femoral component. IMPRESSION: Left hip hemiarthroplasty with cranial dislocation of the left femoral component Electronically Signed   By: Donavan Foil M.D.   On: 10/04/2020 17:42    Assessment/Plan:     Principal Problem:   Hip dislocation, left (HCC) Active Problems:   Left hip pain   Parkinson's disease ([place])   Malnutrition of moderate degree  Patient is scheduled to undergo a left hip revision arthroplasty with Dr. Harlow Mares tomorrow.  Patient will be n.p.o. after midnight which I have ordered.  I have also discontinued her Lovenox in preparation for surgery tomorrow.  Patient  and her husband understood and agreed with the above noted plan for revision surgery tomorrow.    Thornton Park , MD 10/06/2020, 5:31 PM

## 2020-10-07 ENCOUNTER — Ambulatory Visit: Payer: Self-pay | Admitting: Orthopedic Surgery

## 2020-10-07 ENCOUNTER — Inpatient Hospital Stay: Payer: PPO

## 2020-10-07 ENCOUNTER — Encounter: Payer: Self-pay | Admitting: Internal Medicine

## 2020-10-07 ENCOUNTER — Inpatient Hospital Stay: Payer: PPO | Admitting: Anesthesiology

## 2020-10-07 ENCOUNTER — Encounter
Admission: EM | Disposition: A | Discharge status: Home Health | Payer: Self-pay | Source: Skilled Nursing Facility | Attending: Internal Medicine

## 2020-10-07 HISTORY — PX: TOTAL HIP REVISION: SHX763

## 2020-10-07 LAB — TYPE AND SCREEN
ABO/RH(D): A POS
Antibody Screen: NEGATIVE

## 2020-10-07 LAB — RETICULOCYTES
Immature Retic Fract: 24.3 % — ABNORMAL HIGH (ref 2.3–15.9)
RBC.: 3.35 MIL/uL — ABNORMAL LOW (ref 3.87–5.11)
Retic Count, Absolute: 144.1 10*3/uL (ref 19.0–186.0)
Retic Ct Pct: 4.3 % — ABNORMAL HIGH (ref 0.4–3.1)

## 2020-10-07 LAB — VITAMIN B12: Vitamin B-12: 571 pg/mL (ref 180–914)

## 2020-10-07 LAB — CBC
HCT: 32.5 % — ABNORMAL LOW (ref 36.0–46.0)
Hemoglobin: 10.5 g/dL — ABNORMAL LOW (ref 12.0–15.0)
MCH: 32.5 pg (ref 26.0–34.0)
MCHC: 32.3 g/dL (ref 30.0–36.0)
MCV: 100.6 fL — ABNORMAL HIGH (ref 80.0–100.0)
Platelets: 650 10*3/uL — ABNORMAL HIGH (ref 150–400)
RBC: 3.23 MIL/uL — ABNORMAL LOW (ref 3.87–5.11)
RDW: 15.1 % (ref 11.5–15.5)
WBC: 11.3 10*3/uL — ABNORMAL HIGH (ref 4.0–10.5)
nRBC: 0 % (ref 0.0–0.2)

## 2020-10-07 LAB — BASIC METABOLIC PANEL
Anion gap: 7 (ref 5–15)
BUN: 16 mg/dL (ref 8–23)
CO2: 29 mmol/L (ref 22–32)
Calcium: 8.2 mg/dL — ABNORMAL LOW (ref 8.9–10.3)
Chloride: 101 mmol/L (ref 98–111)
Creatinine, Ser: 0.73 mg/dL (ref 0.44–1.00)
GFR, Estimated: >60 mL/min (ref >60)
Glucose, Bld: 96 mg/dL (ref 70–99)
Potassium: 4.2 mmol/L (ref 3.5–5.1)
Sodium: 137 mmol/L (ref 135–145)

## 2020-10-07 LAB — FOLATE: Folate: 9.5 ng/mL (ref >5.9)

## 2020-10-07 LAB — IRON AND TIBC
Iron: 41 ug/dL (ref 28–170)
Saturation Ratios: 13 % (ref 10.4–31.8)
TIBC: 308 ug/dL (ref 250–450)
UIBC: 267 ug/dL

## 2020-10-07 LAB — FERRITIN: Ferritin: 205 ng/mL (ref 11–307)

## 2020-10-07 SURGERY — TOTAL HIP REVISION
Anesthesia: Spinal | Site: Hip | Laterality: Left

## 2020-10-07 MED ORDER — FENTANYL CITRATE (PF) 100 MCG/2ML IJ SOLN
INTRAMUSCULAR | Status: AC
  Filled 2020-10-07: qty 2

## 2020-10-07 MED ORDER — CEFAZOLIN SODIUM-DEXTROSE 2-4 GM/100ML-% IV SOLN
2.0000 g | INTRAVENOUS | Status: AC
  Administered 2020-10-07: 2 g via INTRAVENOUS

## 2020-10-07 MED ORDER — BISACODYL 10 MG RE SUPP: 10.0000 mg | Freq: Every day | RECTAL | Status: DC | PRN

## 2020-10-07 MED ORDER — PROPOFOL 1000 MG/100ML IV EMUL
INTRAVENOUS | Status: AC
  Filled 2020-10-07: qty 100

## 2020-10-07 MED ORDER — PROPOFOL 10 MG/ML IV BOLUS
INTRAVENOUS | Status: DC | PRN
  Administered 2020-10-07 (×3): 20 mg via INTRAVENOUS

## 2020-10-07 MED ORDER — ONDANSETRON HCL 4 MG/2ML IJ SOLN
INTRAMUSCULAR | Status: DC | PRN
  Administered 2020-10-07: 4 mg via INTRAVENOUS

## 2020-10-07 MED ORDER — BUPIVACAINE HCL (PF) 0.5 % IJ SOLN
INTRAMUSCULAR | Status: DC | PRN
  Administered 2020-10-07: 2.8 mL

## 2020-10-07 MED ORDER — LIDOCAINE HCL (PF) 1 % IJ SOLN
INTRAMUSCULAR | Status: DC | PRN
  Administered 2020-10-07: 2 mL via SUBCUTANEOUS

## 2020-10-07 MED ORDER — MAGNESIUM CITRATE PO SOLN
1.0000 | Freq: Once | ORAL | Status: DC | PRN
  Filled 2020-10-07: qty 296

## 2020-10-07 MED ORDER — CHLORHEXIDINE GLUCONATE 4 % EX LIQD: 60.0000 mL | Freq: Once | CUTANEOUS | Status: DC

## 2020-10-07 MED ORDER — SODIUM CHLORIDE 0.9 % IV SOLN
INTRAVENOUS | Status: DC | PRN
  Administered 2020-10-07: 60 ug/min via INTRAVENOUS

## 2020-10-07 MED ORDER — ASPIRIN 81 MG PO CHEW
81.0000 mg | CHEWABLE_TABLET | Freq: Two times a day (BID) | ORAL | Status: DC
  Administered 2020-10-08 – 2020-10-11 (×8): 81 mg via ORAL
  Filled 2020-10-07 (×8): qty 1

## 2020-10-07 MED ORDER — SODIUM CHLORIDE 0.9 % IV BOLUS
500.0000 mL | Freq: Once | INTRAVENOUS | Status: AC
  Administered 2020-10-07: 500 mL via INTRAVENOUS

## 2020-10-07 MED ORDER — METHOCARBAMOL 500 MG PO TABS
500.0000 mg | ORAL_TABLET | Freq: Four times a day (QID) | ORAL | Status: DC
  Administered 2020-10-08 – 2020-10-11 (×13): 500 mg via ORAL
  Filled 2020-10-07 (×13): qty 1

## 2020-10-07 MED ORDER — ALUM & MAG HYDROXIDE-SIMETH 200-200-20 MG/5ML PO SUSP: 30.0000 mL | ORAL | Status: DC | PRN

## 2020-10-07 MED ORDER — TRANEXAMIC ACID-NACL 1000-0.7 MG/100ML-% IV SOLN
INTRAVENOUS | Status: AC
  Filled 2020-10-07: qty 100

## 2020-10-07 MED ORDER — PHENOL 1.4 % MT LIQD
1.0000 | OROMUCOSAL | Status: DC | PRN
  Filled 2020-10-07: qty 177

## 2020-10-07 MED ORDER — METOCLOPRAMIDE HCL 5 MG/ML IJ SOLN
5.0000 mg | Freq: Three times a day (TID) | INTRAMUSCULAR | Status: DC | PRN
Start: 2020-10-07 — End: 2020-10-11

## 2020-10-07 MED ORDER — SURGIRINSE WOUND IRRIGATION SYSTEM - OPTIME
TOPICAL | Status: DC | PRN
  Administered 2020-10-07: 400 mL

## 2020-10-07 MED ORDER — FENTANYL CITRATE (PF) 100 MCG/2ML IJ SOLN
INTRAMUSCULAR | Status: DC | PRN
  Administered 2020-10-07 (×2): 50 ug via INTRAVENOUS

## 2020-10-07 MED ORDER — CEFAZOLIN SODIUM-DEXTROSE 2-4 GM/100ML-% IV SOLN
INTRAVENOUS | Status: AC
  Filled 2020-10-07: qty 100

## 2020-10-07 MED ORDER — LACTATED RINGERS IV SOLN: INTRAVENOUS | Status: DC | PRN

## 2020-10-07 MED ORDER — METOCLOPRAMIDE HCL 10 MG PO TABS: 5.0000 mg | ORAL_TABLET | Freq: Three times a day (TID) | ORAL | Status: DC | PRN

## 2020-10-07 MED ORDER — LACTATED RINGERS IV SOLN: INTRAVENOUS | Status: DC

## 2020-10-07 MED ORDER — MENTHOL 3 MG MT LOZG
1.0000 | LOZENGE | OROMUCOSAL | Status: DC | PRN
  Filled 2020-10-07: qty 9

## 2020-10-07 MED ORDER — PHENYLEPHRINE HCL (PRESSORS) 10 MG/ML IV SOLN
INTRAVENOUS | Status: DC | PRN
  Administered 2020-10-07 (×3): 100 ug via INTRAVENOUS
  Administered 2020-10-07: 50 ug via INTRAVENOUS

## 2020-10-07 MED ORDER — MAGNESIUM HYDROXIDE 400 MG/5ML PO SUSP: 30.0000 mL | Freq: Every day | ORAL | Status: DC | PRN

## 2020-10-07 MED ORDER — ONDANSETRON HCL 4 MG/2ML IJ SOLN: 4.0000 mg | Freq: Four times a day (QID) | INTRAMUSCULAR | Status: DC | PRN

## 2020-10-07 MED ORDER — ONDANSETRON HCL 4 MG PO TABS: 4.0000 mg | ORAL_TABLET | Freq: Four times a day (QID) | ORAL | Status: DC | PRN

## 2020-10-07 MED ORDER — DOCUSATE SODIUM 100 MG PO CAPS
100.0000 mg | ORAL_CAPSULE | Freq: Two times a day (BID) | ORAL | Status: DC
  Administered 2020-10-08 – 2020-10-11 (×8): 100 mg via ORAL
  Filled 2020-10-07 (×8): qty 1

## 2020-10-07 MED ORDER — PROPOFOL 500 MG/50ML IV EMUL
INTRAVENOUS | Status: DC | PRN
  Administered 2020-10-07: 80 ug/kg/min via INTRAVENOUS

## 2020-10-07 MED ORDER — TRANEXAMIC ACID-NACL 1000-0.7 MG/100ML-% IV SOLN
INTRAVENOUS | Status: DC | PRN
  Administered 2020-10-07: 1000 mg via INTRAVENOUS

## 2020-10-07 MED ORDER — POVIDONE-IODINE 10 % EX SWAB
2.0000 "application " | Freq: Once | CUTANEOUS | Status: AC
  Administered 2020-10-07: 2 via TOPICAL

## 2020-10-07 MED ORDER — ONDANSETRON HCL 4 MG/2ML IJ SOLN: 4.0000 mg | Freq: Once | INTRAMUSCULAR | Status: DC | PRN

## 2020-10-07 MED ORDER — FENTANYL CITRATE (PF) 100 MCG/2ML IJ SOLN: 25.0000 ug | INTRAMUSCULAR | Status: DC | PRN

## 2020-10-07 MED ORDER — CEFAZOLIN SODIUM-DEXTROSE 1-4 GM/50ML-% IV SOLN
1.0000 g | Freq: Four times a day (QID) | INTRAVENOUS | Status: AC
  Administered 2020-10-08 (×2): 1 g via INTRAVENOUS
  Filled 2020-10-07 (×4): qty 50

## 2020-10-07 SURGICAL SUPPLY — 72 items
BAG DECANTER FOR FLEXI CONT (MISCELLANEOUS) IMPLANT
BLADE BOVIE TIP EXT 4 (BLADE) ×2 IMPLANT
BLADE SAGITTAL 25.0X1.19X90 (BLADE) ×2 IMPLANT
BNDG COHESIVE 6X5 TAN STRL LF (GAUZE/BANDAGES/DRESSINGS) ×2 IMPLANT
BNDG ELASTIC 6X5.8 VLCR STR LF (GAUZE/BANDAGES/DRESSINGS) ×2 IMPLANT
CHLORAPREP W/TINT 26 (MISCELLANEOUS) ×4 IMPLANT
COVER BACK TABLE REUSABLE LG (DRAPES) ×2 IMPLANT
COVER WAND RF STERILE (DRAPES) ×2 IMPLANT
DRAPE 3/4 80X56 (DRAPES) ×2 IMPLANT
DRAPE INCISE IOBAN 66X60 STRL (DRAPES) ×2 IMPLANT
DRAPE U-SHAPE 47X51 STRL (DRAPES) ×2 IMPLANT
DRSG AQUACEL AG ADV 3.5X14 (GAUZE/BANDAGES/DRESSINGS) ×2 IMPLANT
ELECT BLADE 6.5 EXT (BLADE) ×2 IMPLANT
ELECT CAUTERY BLADE 6.4 (BLADE) ×2 IMPLANT
ELECT REM PT RETURN 9FT ADLT (ELECTROSURGICAL) ×2
ELECTRODE REM PT RTRN 9FT ADLT (ELECTROSURGICAL) ×1 IMPLANT
GAUZE PACK 2X3YD (PACKING) IMPLANT
GAUZE XEROFORM 1X8 LF (GAUZE/BANDAGES/DRESSINGS) ×4 IMPLANT
GAUZE XEROFORM 4X4 STRL (GAUZE/BANDAGES/DRESSINGS) IMPLANT
GLOVE SURG ORTHO LTX SZ8 (GLOVE) ×4 IMPLANT
GLOVE SURG UNDER LTX SZ8 (GLOVE) ×2 IMPLANT
GOWN STRL REUS W/ TWL LRG LVL3 (GOWN DISPOSABLE) ×1 IMPLANT
GOWN STRL REUS W/ TWL XL LVL3 (GOWN DISPOSABLE) ×1 IMPLANT
GOWN STRL REUS W/TWL LRG LVL3 (GOWN DISPOSABLE) ×1
GOWN STRL REUS W/TWL XL LVL3 (GOWN DISPOSABLE) ×1
HEAD FEM TAPER 22OD +4 (Head) ×2 IMPLANT
HEMOVAC 400CC 10FR (MISCELLANEOUS) IMPLANT
HOLDER FOLEY CATH W/STRAP (MISCELLANEOUS) ×2 IMPLANT
HOOD PEEL AWAY FLYTE STAYCOOL (MISCELLANEOUS) ×4 IMPLANT
INSERT DUAL MOBILITY 22ID 38OD (Insert) ×2 IMPLANT
IV NS 100ML SINGLE PACK (IV SOLUTION) IMPLANT
IV NS IRRIG 3000ML ARTHROMATIC (IV SOLUTION) ×2 IMPLANT
KIT TURNOVER KIT A (KITS) ×2 IMPLANT
LINER DUAL MOBILITY 38ID 50OD (Liner) ×2 IMPLANT
MANIFOLD NEPTUNE II (INSTRUMENTS) ×2 IMPLANT
MAT ABSORB  FLUID 56X50 GRAY (MISCELLANEOUS) ×1
MAT ABSORB FLUID 56X50 GRAY (MISCELLANEOUS) ×1 IMPLANT
NDL SAFETY ECLIPSE 18X1.5 (NEEDLE) ×1 IMPLANT
NEEDLE HYPO 18GX1.5 SHARP (NEEDLE) ×1
NEEDLE HYPO 22GX1.5 SAFETY (NEEDLE) ×2 IMPLANT
NS IRRIG 1000ML POUR BTL (IV SOLUTION) ×2 IMPLANT
PACK HIP PROSTHESIS (MISCELLANEOUS) ×2 IMPLANT
PAD ARMBOARD 7.5X6 YLW CONV (MISCELLANEOUS) ×2 IMPLANT
PILLOW ABDUCTION FOAM SM (MISCELLANEOUS) ×2 IMPLANT
PILLOW ABDUCTION MEDIUM (MISCELLANEOUS) ×2 IMPLANT
PRESSURIZER CEMENT PROX FEM SM (MISCELLANEOUS) IMPLANT
PRESSURIZER FEM CANAL M (MISCELLANEOUS) IMPLANT
PULSAVAC PLUS IRRIG FAN TIP (DISPOSABLE) ×2
RETRIEVER SUT HEWSON (MISCELLANEOUS) ×2 IMPLANT
SCREW 6.5X25MM (Screw) ×4 IMPLANT
SCREW 6.5X30MM (Screw) ×2 IMPLANT
SCREW CAN HEAD 6.5MM HIP (Screw) ×4 IMPLANT
SCREW DOME HOLE 6.5 15 (Screw) ×6 IMPLANT
SHELL MULTI HOLE HEMI 50OD (Shell) ×2 IMPLANT
SLEEVE CABLE 2MM VT (Orthopedic Implant) ×2 IMPLANT
STAPLER SKIN PROX 35W (STAPLE) ×2 IMPLANT
STEM LATERAL COLLAR LAT1 12/14 (Stem) ×2 IMPLANT
SUT DVC 2 QUILL PDO  T11 36X36 (SUTURE) ×1
SUT DVC 2 QUILL PDO T11 36X36 (SUTURE) ×1 IMPLANT
SUT ETHIBOND #5 BRAIDED 30INL (SUTURE) ×2 IMPLANT
SUT MERSILENE 5MM BP 1 12 (SUTURE) IMPLANT
SUT VIC AB 1 CT1 18XCR BRD 8 (SUTURE) ×1 IMPLANT
SUT VIC AB 1 CT1 8-18 (SUTURE) ×1
SUT VIC AB 2-0 CT1 18 (SUTURE) ×6 IMPLANT
SUT VIC AB 2-0 CT1 27 (SUTURE) ×1
SUT VIC AB 2-0 CT1 TAPERPNT 27 (SUTURE) ×1 IMPLANT
SYR 20ML LL LF (SYRINGE) ×2 IMPLANT
SYR TB 1ML 27GX1/2 LL (SYRINGE) IMPLANT
TIP BRUSH PULSAVAC PLUS 24.33 (MISCELLANEOUS) ×2 IMPLANT
TIP FAN IRRIG PULSAVAC PLUS (DISPOSABLE) ×1 IMPLANT
TOWER CARTRIDGE SMART MIX (DISPOSABLE) IMPLANT
WAND WEREWOLF FASTSEAL 6.0 (MISCELLANEOUS) ×2 IMPLANT

## 2020-10-07 NOTE — Progress Notes (Signed)
Upon arrival to pacu, patient delirious, wanting to pull at all equipment, repeating "get me out of here" Per Dr. Harlow Mares, abductor pillow to stay in place "at all times" Per anesthesia Dr. Rosey Bath keep MAP in 70's. Patient has become more awake, verbalizes she is in hospital and has had hip surgery. Patient received spinal anesthesia, upon arrival able to wiggle toes both lower ext, sensory intact. Left hip dressing c/d/I Did place hand mitts on patient to protect indwelling foley catheter. Will continue to observe and reevaluate.

## 2020-10-07 NOTE — Consult Note (Signed)
ORTHOPAEDIC CONSULTATION  REQUESTING PHYSICIAN: Lorella Nimrod, MD  Chief Complaint: left hip pain  HPI: Jasmine Colon is a 80 y.o. female who complains of left hip pain after multiple dislocations of her left hip. She had a hemiarthroplasty 2 weeks ago. She does suffer with Parkinson's disease and has significant spasticity complicating her postoperative condition. The pain is sharp in character. The pain is severe and 10/10. The pain is worse with movement and better with rest. Denies any numbness, tingling or constitutional symptoms.  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 Hormone positive, HER 2 no amplified   Mitral valve disorder    Osteoarthritis    Osteoporosis    Parkinson's disease ([place]) 2012   Personal history of radiation therapy 2013   right breast ca with lumpectomy and rad tx   Past Surgical History:  Procedure Laterality Date   BREAST BIOPSY Right 10/26/2011   invasive mammary carcinoma and DCIS   BREAST BIOPSY Left 01/09/2014   retroaerolar bx with Dr. Jamal Collin. Benign   BREAST LUMPECTOMY Right 11/13/2011   invasive mammary ca, DCIS, LCIS in specimen. Clear margins.  LN negative   BREAST SURGERY Right 2013   wide excision   CESAREAN SECTION  31 years ago   COLONOSCOPY  2008   HIP ARTHROPLASTY Left 09/21/2020   Procedure: ARTHROPLASTY BIPOLAR HIP (HEMIARTHROPLASTY);  Surgeon: Earnestine Leys, MD;  Location: [facility] ORS;  Service: Orthopedics;  Laterality: Left;   HIP CLOSED REDUCTION Left 09/27/2020   Procedure: CLOSED REDUCTION HIP;  Surgeon: Renee Harder, MD;  Location: [facility] ORS;  Service: Orthopedics;  Laterality: Left;   Social History   Socioeconomic History   Marital status: Married    Spouse name: Jori Moll   Number of children: 2   Years of education: college   Highest  education level: Not on file  Occupational History   Occupation: part time    Comment: Product manager day week,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  Tobacco Use   Smoking status: Every Day    Packs/day: 0.50    Years: 45.00    Pack years: 22.50    Types: Cigarettes   Smokeless tobacco: Never  Substance and Sexual Activity   Alcohol use: No   Drug use: No   Sexual activity: Not on file  Other Topics Concern   Not on file  Social History Narrative   Patient is right handed, resides with husband   Social Determinants of Radio broadcast assistant Strain: Not on file  Food Insecurity: Not on file  Transportation Needs: Not on file  Physical Activity: Not on file  Stress: Not on file  Social Connections: Not on file   Family History  Problem Relation Age of Onset   Stroke Mother    Heart failure Father    Heart disease Brother        bypass   Cancer Maternal Aunt        breast    Breast cancer Maternal Aunt    Cancer Maternal Aunt        breast   Breast cancer Maternal Aunt    Allergies  Allergen Reactions   Codeine     Feels weird   Pseudoephedrine     Other reaction(s): Dizziness and giddiness (finding)  Prior to Admission medications   Medication Sig Start Date End Date Taking? Authorizing Provider  acetaminophen (TYLENOL) 650 MG CR tablet Take 650 mg by mouth every 8 (eight) hours as needed for pain.   Yes [provider]  aspirin EC 325 MG tablet Take 1 tablet (325 mg total) by mouth 2 (two) times daily for 14 days. 09/29/20 10/13/20 Yes Danford, Suann Larry, MD  Calcium Carbonate-Vitamin D (CALTRATE 600+D PO) Take 1 tablet by mouth daily.   Yes [provider]  carbidopa-levodopa (SINEMET IR) 25-100 MG tablet Take 1 tablet by mouth 6 (six) times daily. 09/24/20  Yes Fritzi Mandes, MD  cyanocobalamin 100 MCG tablet Take 100 mcg by mouth daily.   Yes [provider]  docusate sodium (COLACE) 100 MG capsule Take 1 capsule (100 mg total)  by mouth 2 (two) times daily. Patient taking differently: Take 100 mg by mouth 2 (two) times daily as needed for mild constipation or moderate constipation. 09/24/20  Yes Fritzi Mandes, MD  feeding supplement (ENSURE ENLIVE / ENSURE PLUS) LIQD Take 237 mLs by mouth 3 (three) times daily between meals. 09/29/20  Yes Danford, Suann Larry, MD  ferrous sulfate 325 (65 FE) MG tablet Take 1 tablet (325 mg total) by mouth daily with breakfast. 09/25/20  Yes Fritzi Mandes, MD  Multiple Vitamins-Minerals (PRESERVISION AREDS PO) Take 1 capsule by mouth daily.   Yes [provider]  oxyCODONE (OXY IR/ROXICODONE) 5 MG immediate release tablet Take 1 tablet (5 mg total) by mouth every 4 (four) hours as needed for moderate pain. Patient taking differently: Take 5 mg by mouth every 4 (four) hours as needed for severe pain. 09/29/20  Yes Danford, Suann Larry, MD  rasagiline (AZILECT) 1 MG TABS tablet Take 1 tablet (1 mg total) by mouth daily. 09/25/20  Yes Fritzi Mandes, MD  aspirin EC 81 MG tablet Take 81 mg by mouth in the morning and at bedtime.    [provider]  cyanocobalamin 1000 MCG tablet Take 1,000 mcg by mouth daily. Patient not taking: Reported on 10/04/2020    [provider]  fluticasone (FLONASE) 50 MCG/ACT nasal spray Place 2 sprays into both nostrils daily as needed for allergies or rhinitis.    [provider]  HYDROcodone-acetaminophen (NORCO/VICODIN) 5-325 MG tablet Take 1 tablet by mouth every 6 (six) hours as needed for moderate pain. Patient not taking: No sig reported    [provider]  ibuprofen (ADVIL) 200 MG tablet Take 400 mg by mouth every 8 (eight) hours as needed (pain).    [provider]  loratadine (CLARITIN) 10 MG tablet Take 10 mg by mouth daily as needed for allergies.    [provider]  mirtazapine (REMERON) 30 MG tablet Take 30 mg by mouth at bedtime. 06/27/20   [provider]  sodium chloride (OCEAN) 0.65 % SOLN  nasal spray Place 1 spray into both nostrils daily as needed (allergies).    [provider]   No results found.  Positive ROS: All other systems have been reviewed and were otherwise negative with the exception of those mentioned in the HPI and as above.  Physical Exam: General: Alert, no acute distress Cardiovascular: No pedal edema Respiratory: No cyanosis, no use of accessory musculature GI: No organomegaly, abdomen is soft and non-tender Skin: No lesions in the area of chief complaint Neurologic: Sensation intact distally Psychiatric: Patient is competent for consent with normal mood and affect Lymphatic: No axillary or cervical lymphadenopathy  MUSCULOSKELETAL:  left hip short, internally rotated. Compartments soft. Good cap refill. Motor and sensory intact distally.  Assessment: Multiple hip hemiarthroplasty dislocations after femoral neck fracture and history of Parkinson's disease  Plan: The patient has a complex issue with poor prognosis given her medical condition. Discussed treatment options including conversion to total hip arthroplasty with dual mobility versus Girdlestone procedure. She is otherwise high functioning and would like everything possible done for an optimal outcome. She has elected to proceed with removal of implants and conversion to total hip arthroplasty.  The diagnosis, risks, benefits and alternatives to treatment are all discussed in detail with the patient and family. Risks include but are not limited to bleeding, infection, deep vein thrombosis, pulmonary embolism, nerve or vascular injury, non-union, repeat operation, persistent pain, weakness, stiffness and death. She understands and is eager to proceed.     Lovell Sheehan, MD    10/07/2020 6:42 PM

## 2020-10-07 NOTE — Anesthesia Preprocedure Evaluation (Addendum)
Anesthesia Evaluation  Patient identified by MRN, date of birth, ID band Patient awake    Reviewed: Allergy & Precautions, H&P , NPO status , Patient's Chart, lab work & pertinent test results, reviewed documented beta blocker date and time   Airway Mallampati: II  TM Distance: >3 FB Neck ROM: full    Dental  (+) Teeth Intact   Pulmonary asthma , Current Smoker and Patient abstained from smoking.,    Pulmonary exam normal        Cardiovascular Exercise Tolerance: Poor negative cardio ROS Normal cardiovascular exam Rhythm:regular Rate:Normal     Neuro/Psych  Neuromuscular disease negative psych ROS   GI/Hepatic negative GI ROS, Neg liver ROS,   Endo/Other  negative endocrine ROS  Renal/GU Renal disease  negative genitourinary   Musculoskeletal   Abdominal   Peds  Hematology  (+) Blood dyscrasia, anemia ,   Anesthesia Other Findings Past Medical History: 2012: Asthma No date: Hyperlipidemia No date: Lesion of lateral popliteal nerve No date: Lower back pain 2013: Malignant neoplasm of upper outer quadrant of female breast  ([place])     Comment:  T1b, N0, M0. Hormone positive, HER 2 no amplified No date: Mitral valve disorder No date: Osteoarthritis No date: Osteoporosis 2012: Parkinson's disease ([place]) 2013: Personal history of radiation therapy     Comment:  right breast ca with lumpectomy and rad tx Past Surgical History: 10/26/2011: BREAST BIOPSY; Right     Comment:  invasive mammary carcinoma and DCIS 01/09/2014: BREAST BIOPSY; Left     Comment:  retroaerolar bx with Dr. Jamal Collin. Benign 11/13/2011: BREAST LUMPECTOMY; Right     Comment:  invasive mammary ca, DCIS, LCIS in specimen. Clear               margins.  LN negative 2013: BREAST SURGERY; Right     Comment:  wide excision 31 years ago: CESAREAN SECTION 2008: COLONOSCOPY 09/21/2020: HIP ARTHROPLASTY; Left     Comment:  Procedure: ARTHROPLASTY BIPOLAR  HIP (HEMIARTHROPLASTY);               Surgeon: Earnestine Leys, MD;  Location: [facility] ORS;                Service: Orthopedics;  Laterality: Left; 09/27/2020: HIP CLOSED REDUCTION; Left     Comment:  Procedure: CLOSED REDUCTION HIP;  Surgeon: Renee Harder, MD;  Location: [facility] ORS;  Service: Orthopedics;               Laterality: Left; BMI    Body Mass Index: 18.31 kg/m     Reproductive/Obstetrics negative OB ROS                            Anesthesia Physical Anesthesia Plan  ASA: 3  Anesthesia Plan: Spinal   Post-op Pain Management:    Induction:   PONV Risk Score and Plan:   Airway Management Planned:   Additional Equipment:   Intra-op Plan:   Post-operative Plan:   Informed Consent: I have reviewed the patients History and Physical, chart, labs and discussed the procedure including the risks, benefits and alternatives for the proposed anesthesia with the patient or authorized representative who has indicated his/her understanding and acceptance.     Dental Advisory Given  Plan Discussed with: CRNA  Anesthesia Plan Comments:         Anesthesia Quick Evaluation

## 2020-10-07 NOTE — Progress Notes (Signed)
Bedside report with RN: Thayer Headings Patient's daughter at bedside. Removed hand mitts upon arrival to room 137. Patient states more awake and "thank you for taking them off" Rn : Thayer Headings will continue to observe and monitor.

## 2020-10-07 NOTE — Progress Notes (Signed)
PROGRESS NOTE    Jasmine Colon  NOM:[id number] DOB: April 13, 1941 DOA: 10/03/2020 PCP: Dion Body, MD   Brief Narrative: Taken from H&P. Jasmine Colon is a 80 y.o. female with medical history significant for Parkinson's disease, status post left hip hemiarthroplasty on 09/21/20 following a fall complicated by periprosthetic dislocation requiring closed reduction in the OR on 09/27/20.   Patient presents to the ER via EMS with complaints of left hip pain that occurred suddenly while she was sitting.  Found to have another superior dislocation of left hip arthroplasty. Patient is unable to keep her knee straight as advised by orthopedic. Orthopedic surgery is planning to take her to the OR on Monday for total hip arthroplasty.  Apparently Foley catheter was placed by orthopedic to prevent excessive movements and they were requesting to keep it in till after the surgery on Monday.  6/19:Patient accidentally pulled her catheter out last night resulted in some traumatic injury and hematuria. Currently on pure wick.  Subjective: Patient was seen and examined today.  No new complaint.  She was waiting for her total hip replacement surgery later today.  Assessment & Plan:   Principal Problem:   Hip dislocation, left (HCC) Active Problems:   Left hip pain   Parkinson's disease ([place])   Malnutrition of moderate degree  Left Hip Dislocation.  Multiple dislocations of hemiarthroplasty.  Patient will need a total hip arthroplasty later today by orthopedic surgery. -Continue with pain management  Gross hematuria.  Secondary to trauma with accidental pulling of Foley catheter by patient overnight. -Monitor hemoglobin-currently stable -Continue with pure wick-we will avoid Foley catheter.  Parkinson's disease. -Continue home dose of Sinemet and rasagiline.  Malnutrition of moderate degree (BMI 18) Continue nutritional supplements 3 times a day Maintain regular  diet  Depression Continue mirtazapine  Objective: Vitals:   10/06/20 1952 10/07/20 0458 10/07/20 0741 10/07/20 1358  BP: 108/72 133/85 120/81 122/84  Pulse: 82 83 84 82  Resp: 18 18 16 16   Temp: 97.8 F (36.6 C) 98.4 F (36.9 C) 97.6 F (36.4 C) 97.9 F (36.6 C)  TempSrc: Oral Oral Oral Temporal  SpO2: 94% 96% 98% 96%  Weight:    49.9 kg  Height:    5\' 5"  (1.651 m)    Intake/Output Summary (Last 24 hours) at 10/07/2020 1659 Last data filed at 10/07/2020 1632 Gross per 24 hour  Intake 100 ml  Output 875 ml  Net -775 ml    Filed Weights   10/03/20 1929 10/07/20 1358  Weight: 49.9 kg 49.9 kg    Examination:  General.  Frail elderly lady, in no acute distress. Pulmonary.  Lungs clear bilaterally, normal respiratory effort. CV.  Regular rate and rhythm, no JVD, rub or murmur. Abdomen.  Soft, nontender, nondistended, BS positive. CNS.  Alert and oriented to self.  No focal neurologic deficit. Extremities.  No edema, no cyanosis, pulses intact and symmetrical. Psychiatry.  Judgment and insight appears impaired.  DVT prophylaxis: Lovenox Code Status: Full Family Communication: No family at bedside Disposition Plan:  Status is: Inpatient  Remains inpatient appropriate because:Inpatient level of care appropriate due to severity of illness  Dispo: The patient is from: SNF              Anticipated d/c is to: SNF              Patient currently is not medically stable to d/c.   Difficult to place patient No  Level of care: Med-Surg  All the records are reviewed and case discussed with Care Management/Social Worker. Management plans discussed with the patient, nursing and they are in agreement.  Consultants:  Orthopedic  Procedures:  Antimicrobials:   Data Reviewed: I have personally reviewed following labs and imaging studies  CBC: Recent Labs  Lab 10/03/20 2010 10/04/20 0556 10/07/20 0429  WBC 10.2 10.8* 11.3*  NEUTROABS 7.2  --   --   HGB  9.4* 9.1* 10.5*  HCT 28.5* 27.3* 32.5*  MCV 98.3 98.6 100.6*  PLT 585* 533* 650*    Basic Metabolic Panel: Recent Labs  Lab 10/03/20 2050 10/04/20 0556 10/07/20 0429  NA 135 134* 137  K 4.1 3.9 4.2  CL 104 102 101  CO2 24 28 29   GLUCOSE 97 93 96  BUN 13 13 16   CREATININE 0.61 0.71 0.73  CALCIUM 8.4* 8.1* 8.2*    GFR: Estimated Creatinine Clearance: 46.4 mL/min (by C-G formula based on SCr of 0.73 mg/dL). Liver Function Tests: No results for input(s): AST, ALT, ALKPHOS, BILITOT, PROT, ALBUMIN in the last 168 hours. No results for input(s): LIPASE, AMYLASE in the last 168 hours. No results for input(s): AMMONIA in the last 168 hours. Coagulation Profile: No results for input(s): INR, PROTIME in the last 168 hours. Cardiac Enzymes: No results for input(s): CKTOTAL, CKMB, CKMBINDEX, TROPONINI in the last 168 hours. BNP (last 3 results) No results for input(s): PROBNP in the last 8760 hours. HbA1C: No results for input(s): HGBA1C in the last 72 hours. CBG: No results for input(s): GLUCAP in the last 168 hours. Lipid Profile: No results for input(s): CHOL, HDL, LDLCALC, TRIG, CHOLHDL, LDLDIRECT in the last 72 hours. Thyroid Function Tests: No results for input(s): TSH, T4TOTAL, FREET4, T3FREE, THYROIDAB in the last 72 hours. Anemia Panel: Recent Labs    10/07/20 0429 10/07/20 0811  VITAMINB12  --  571  FOLATE  --  9.5  FERRITIN  --  205  TIBC  --  308  IRON  --  41  RETICCTPCT 4.3*  --    Sepsis Labs: No results for input(s): PROCALCITON, LATICACIDVEN in the last 168 hours.   Recent Results (from the past 240 hour(s))  Resp Panel by RT-PCR (Flu A&B, Covid) Nasopharyngeal Swab     Status: None   Collection Time: 09/30/20 11:33 AM   Specimen: Nasopharyngeal Swab; Nasopharyngeal(NP) swabs in vial transport medium  Result Value Ref Range Status   SARS Coronavirus 2 by RT PCR NEGATIVE NEGATIVE Final    Comment: (NOTE) SARS-CoV-2 target nucleic acids are NOT  DETECTED.  The SARS-CoV-2 RNA is generally detectable in upper respiratory specimens during the acute phase of infection. The lowest concentration of SARS-CoV-2 viral copies this assay can detect is 138 copies/mL. A negative result does not preclude SARS-Cov-2 infection and should not be used as the sole basis for treatment or other patient management decisions. A negative result may occur with  improper specimen collection/handling, submission of specimen other than nasopharyngeal swab, presence of viral mutation(s) within the areas targeted by this assay, and inadequate number of viral copies(<138 copies/mL). A negative result must be combined with clinical observations, patient history, and epidemiological information. The expected result is Negative.  Fact Sheet for Patients:  EntrepreneurPulse.com.au  Fact Sheet for Healthcare Providers:  IncredibleEmployment.be  This test is no t yet approved or cleared by the [place] FDA and  has been authorized for detection and/or diagnosis of SARS-CoV-2 by FDA under an Emergency Use Authorization (  EUA). This EUA will remain  in effect (meaning this test can be used) for the duration of the COVID-19 declaration under Section 564(b)(1) of the Act, 21 U.S.C.section 360bbb-3(b)(1), unless the authorization is terminated  or revoked sooner.       Influenza A by PCR NEGATIVE NEGATIVE Final   Influenza B by PCR NEGATIVE NEGATIVE Final    Comment: (NOTE) The Xpert Xpress SARS-CoV-2/FLU/RSV plus assay is intended as an aid in the diagnosis of influenza from Nasopharyngeal swab specimens and should not be used as a sole basis for treatment. Nasal washings and aspirates are unacceptable for Xpert Xpress SARS-CoV-2/FLU/RSV testing.  Fact Sheet for Patients: EntrepreneurPulse.com.au  Fact Sheet for Healthcare Providers: IncredibleEmployment.be  This test is not yet  approved or cleared by the [place] FDA and has been authorized for detection and/or diagnosis of SARS-CoV-2 by FDA under an Emergency Use Authorization (EUA). This EUA will remain in effect (meaning this test can be used) for the duration of the COVID-19 declaration under Section 564(b)(1) of the Act, 21 U.S.C. section 360bbb-3(b)(1), unless the authorization is terminated or revoked.  Performed at [facility], [place], [place] [address]   SARS CORONAVIRUS 2 (TAT 6-24 HRS) Nasopharyngeal Nasopharyngeal Swab     Status: None   Collection Time: 10/03/20 11:50 PM   Specimen: Nasopharyngeal Swab  Result Value Ref Range Status   SARS Coronavirus 2 NEGATIVE NEGATIVE Final    Comment: (NOTE) SARS-CoV-2 target nucleic acids are NOT DETECTED.  The SARS-CoV-2 RNA is generally detectable in upper and lower respiratory specimens during the acute phase of infection. Negative results do not preclude SARS-CoV-2 infection, do not rule out co-infections with other pathogens, and should not be used as the sole basis for treatment or other patient management decisions. Negative results must be combined with clinical observations, patient history, and epidemiological information. The expected result is Negative.  Fact Sheet for Patients: SugarRoll.be  Fact Sheet for Healthcare Providers: https://www.woods-mathews.com/  This test is not yet approved or cleared by the [place] FDA and  has been authorized for detection and/or diagnosis of SARS-CoV-2 by FDA under an Emergency Use Authorization (EUA). This EUA will remain  in effect (meaning this test can be used) for the duration of the COVID-19 declaration under Se ction 564(b)(1) of the Act, 21 U.S.C. section 360bbb-3(b)(1), unless the authorization is terminated or revoked sooner.  Performed at [facility], [place] [address], [place], [place] [address]        Radiology Studies: No results found.  Scheduled Meds:  [MAR Hold] carbidopa-levodopa  1 tablet Oral 6 X Daily   chlorhexidine  60 mL Topical Once   [MAR Hold] Chlorhexidine Gluconate Cloth  6 each Topical Daily   [MAR Hold] feeding supplement  237 mL Oral TID BM   [MAR Hold] ferrous sulfate  325 mg Oral Q breakfast   [MAR Hold] mirtazapine  30 mg Oral QHS   [MAR Hold] multivitamin-lutein   Oral Daily   [MAR Hold] rasagiline  1 mg Oral Daily   [MAR Hold] sodium chloride flush  3 mL Intravenous Q12H   [MAR Hold] cyanocobalamin  100 mcg Oral Daily   Continuous Infusions:  [MAR Hold] sodium chloride     tranexamic acid       LOS: 3 days   Time spent: 25 minutes. More than 50% of the time was spent in counseling/coordination of care  Lorella Nimrod, MD [facility]  If 7PM-7AM, please contact night-coverage  [email or web address]  10/07/2020, 4:59 PM   This record has been created using Systems analyst. Errors have been sought and corrected,but may not always be located. Such creation errors do not reflect on the standard of care.

## 2020-10-07 NOTE — Transfer of Care (Signed)
Immediate Anesthesia Transfer of Care Note  Patient: Mahrosh Donnell  Procedure(s) Performed: TOTAL HIP REVISION (Left: Hip)  Patient Location: PACU  Anesthesia Type:MAC and Spinal  Level of Consciousness: awake  Airway & Oxygen Therapy: Patient Spontanous Breathing  Post-op Assessment: Report given to RN and Post -op Vital signs reviewed and stable  Post vital signs: Reviewed and stable  Last Vitals:  Vitals Value Taken Time  BP 101/70 10/07/20 1841  Temp    Pulse 69 10/07/20 1838  Resp 15 10/07/20 1844  SpO2 88 % 10/07/20 1838  Vitals shown include unvalidated device data.  Last Pain:  Vitals:   10/07/20 1358  TempSrc: Temporal  PainSc:       Patients Stated Pain Goal: 3 (63/84/66 5993)  Complications: No notable events documented.

## 2020-10-07 NOTE — Op Note (Signed)
10/07/2020  6:48 PM  PATIENT:  Jasmine Colon   MRN: [id number]  PRE-OPERATIVE DIAGNOSIS:  Left Hip Dislocation  POST-OPERATIVE DIAGNOSIS:  Left Hip Dislocation  PROCEDURE:  Procedure(s): TOTAL HIP REVISION  PREOPERATIVE INDICATIONS:    Jasmine Colon is an 80 y.o. female who has a diagnosis of Left Hip Dislocation and elected for surgical management after failing conservative treatment.  The risks benefits and alternatives were discussed with the patient including but not limited to the risks of nonoperative treatment, versus surgical intervention including infection, bleeding, nerve injury, periprosthetic fracture, the need for revision surgery, dislocation, leg length discrepancy, blood clots, cardiopulmonary complications, morbidity, mortality, among others, and they were willing to proceed.     OPERATIVE REPORT     SURGEON: Elyn Aquas. Harlow Mares, MD    ASSISTANT:    Carlynn Spry  , [facility]  ANESTHESIA: Spinal    COMPLICATIONS:  None.   DRAINS: None  EBL:   300 mL                           COMPONENTS:  [employer]  femur size 1 Lateral with a 22 mm +4  mm inner head ball and a 22 mm ID, 38 mm OD dual mobility insert and a  acetabular shell size 50 mm with a 38 mm by 50 mm dual mobility liner    PROCEDURE IN DETAIL:   The patient was met in the holding area and  identified.  The appropriate hip was identified and marked at the operative site.  The patient was then transported to the OR  and  placed under spinall anesthesia.  At that point, the patient was  placed in the lateral decubitus position with the operative side up and  secured to the operating room table and all bony prominences padded.     The operative lower extremity was prepped from the iliac crest to the distal leg.  Sterile draping was performed.  Time out was performed prior to incision.      A routine posterolateral approach was utilized through the prior incision.  Gross bleeders were Bovie coagulated.  The  iliotibial band was identified and incised along the length of the skin incision. Old suture material was removed. Self-retaining Charnley retractor was inserted.  The hip was already dislocated and the short external rotators and posterior hip capsule were torn completely.  The unipolar head was removed with a bone tamp. The threaded inserter was then placed into the Stryker femoral component and backslapped out of the femur with minimal bone loss.   Examination of the acetabulum revealed an avulsion fracture of the posterior rim approximately 5 mm in thickness. The remaining acetabulum was intact. There was significant seroma and hematoma both above and below the fascia.     I then exposed the deep acetabulum, cleared out any tissue including the ligamentum teres.  A deep retractor was placed.  After adequate visualization, I excised the labrum, and then sequentially reamed.  The multi-hole cup was impacted into position with good fixation.  Appropriate version and inclination was confirmed clinically matching their bony anatomy, and also with the use of the jig. I increased the anteversion to 30 degrees given the loss of the posterior wall and given her normal posturing due to her Parkinson's was flexion and internal rotation. Three screws were placed.  A polyethylene trial liner was placed and the deep retractor removed.    I then prepared  the proximal femur by lateralizing and sequentially broaching. The size 1 lateral fit appropriately. A calcar fracture was noted while exposing the proximal femur. A decision was made to place a cerclage cable.  A trial broach, neck, and head was utilized, and I reduced the hip and it was found to have excellent stability with functional range of motion. The hip was stable beyond 90 degrees of flexion and internal rotation of 45 degrees. She was stable in the position of sleep. The trial components were then removed. A final liner was impacted. The femoral  prosthesis put in place with appropriate version, slightly anteverted to the normal anatomy, and I impacted the real head ball with dual mobility into place. The hip was then reduced and taken through functional range of motion and found to have excellent stability. Leg lengths were restored.  The posterior capsule was torn and not repairable.  I then irrigated the hip copiously again with pulse lavage, and repaired the fascia with #1 vicryl and #2 Quill, followed by 2-0 vicryl for the subcutaneous tissue, and staples for the skin, xeroform and Aquacel. Sponge and needle counts were correct.  The patient was then awakened and returned to PACU in stable and satisfactory condition. There were no complications.  Given the fractures encountered during surgery, she will be non-weightbearing.

## 2020-10-07 NOTE — Anesthesia Procedure Notes (Addendum)
Spinal  Patient location during procedure: OR Start time: 10/07/2020 2:50 PM End time: 10/07/2020 2:59 PM Reason for block: surgical anesthesia Staffing Performed: resident/CRNA  Anesthesiologist: Molli Barrows, MD Resident/CRNA: Lerry Liner, CRNA Preanesthetic Checklist Completed: patient identified, IV checked, site marked, risks and benefits discussed, surgical consent, monitors and equipment checked, pre-op evaluation and timeout performed Spinal Block Patient position: sitting Prep: DuraPrep Patient monitoring: heart rate, cardiac monitor, continuous pulse ox and blood pressure Approach: midline Location: L4-5 Injection technique: single-shot Needle Needle type: Pencan  Needle gauge: 24 G Needle length: 9 cm Assessment Sensory level: T4 Events: CSF return Additional Notes Negative heme, negative pain with injection, csf pre/post injection

## 2020-10-08 LAB — CBC
HCT: 27.2 % — ABNORMAL LOW (ref 36.0–46.0)
Hemoglobin: 8.6 g/dL — ABNORMAL LOW (ref 12.0–15.0)
MCH: 32.6 pg (ref 26.0–34.0)
MCHC: 31.6 g/dL (ref 30.0–36.0)
MCV: 103 fL — ABNORMAL HIGH (ref 80.0–100.0)
Platelets: 489 10*3/uL — ABNORMAL HIGH (ref 150–400)
RBC: 2.64 MIL/uL — ABNORMAL LOW (ref 3.87–5.11)
RDW: 15.3 % (ref 11.5–15.5)
WBC: 12.4 10*3/uL — ABNORMAL HIGH (ref 4.0–10.5)
nRBC: 0 % (ref 0.0–0.2)

## 2020-10-08 LAB — BASIC METABOLIC PANEL
Anion gap: 6 (ref 5–15)
BUN: 18 mg/dL (ref 8–23)
CO2: 25 mmol/L (ref 22–32)
Calcium: 7.8 mg/dL — ABNORMAL LOW (ref 8.9–10.3)
Chloride: 104 mmol/L (ref 98–111)
Creatinine, Ser: 0.78 mg/dL (ref 0.44–1.00)
GFR, Estimated: >60 mL/min (ref >60)
Glucose, Bld: 110 mg/dL — ABNORMAL HIGH (ref 70–99)
Potassium: 4.4 mmol/L (ref 3.5–5.1)
Sodium: 135 mmol/L (ref 135–145)

## 2020-10-08 MED ORDER — FE FUMARATE-B12-VIT C-FA-IFC PO CAPS
1.0000 | ORAL_CAPSULE | Freq: Two times a day (BID) | ORAL | Status: DC
  Administered 2020-10-08 – 2020-10-11 (×7): 1 via ORAL
  Filled 2020-10-08 (×10): qty 1

## 2020-10-08 MED ORDER — SODIUM CHLORIDE 0.9 % IV BOLUS
500.0000 mL | Freq: Once | INTRAVENOUS | Status: AC
  Administered 2020-10-08: 500 mL via INTRAVENOUS

## 2020-10-08 NOTE — Progress Notes (Signed)
   10/07/20 2227  Assess: MEWS Score  Temp 98.4 F (36.9 C)  BP (!) 80/55  Pulse Rate 87  Resp 16  SpO2 98 %  O2 Device Nasal Cannula  Assess: MEWS Score  MEWS Temp 0  MEWS Systolic 2  MEWS Pulse 0  MEWS RR 0  MEWS LOC 0  MEWS Score 2  MEWS Score Color Yellow  Assess: if the MEWS score is Yellow or Red  Were vital signs taken at a resting state? No  Focused Assessment No change from prior assessment  Early Detection of Sepsis Score *See Row Information* Low  MEWS guidelines implemented *See Row Information* No, other (Comment) (Patient just came back from [place] and had AMS.  A bolus was ordered.)  Treat  MEWS Interventions Administered scheduled meds/treatments  Pain Scale 0-10  Pain Type Surgical pain  Pain Location Hip  Pain Orientation Left  Pain Descriptors / Indicators Aching;Burning  Pain Frequency Intermittent  Pain Onset Gradual  Pain Intervention(s) Medication (See eMAR)  Multiple Pain Sites No  Patients response to intervention Effective  Notify: Charge Nurse/RN  Name of Charge Nurse/RN Runner, broadcasting/film/video, RN  Date Charge Nurse/RN Notified 10/07/20  Time Charge Nurse/RN Notified 2240  Notify: Provider  Provider Name/Title Cherene Altes  Date Provider Notified 10/07/20  Time Provider Notified 2250  Notification Type  (secure chat)  Notification Reason Other (Comment) (for Bolus to bring BP back to normal)  Provider response See new orders  Date of Provider Response 10/07/20  Time of Provider Response 2300  Document  Patient Outcome Stabilized after interventions  Progress note created (see row info) Yes

## 2020-10-08 NOTE — Anesthesia Postprocedure Evaluation (Signed)
Anesthesia Post Note  Patient: Jasmine Colon  Procedure(s) Performed: TOTAL HIP REVISION (Left: Hip)  Patient location during evaluation: Nursing Unit Anesthesia Type: Spinal Level of consciousness: oriented and awake and alert Pain management: pain level controlled Vital Signs Assessment: post-procedure vital signs reviewed and stable Respiratory status: spontaneous breathing and respiratory function stable Cardiovascular status: blood pressure returned to baseline and stable Postop Assessment: no headache, no backache, no apparent nausea or vomiting and patient able to bend at knees Anesthetic complications: no   No notable events documented.   Last Vitals:  Vitals:   10/08/20 0425 10/08/20 0741  BP: (!) 88/57 91/63  Pulse: 94 97  Resp: 14 16  Temp: 36.8 C 37.6 C  SpO2: 95% 96%    Last Pain:  Vitals:   10/08/20 0741  TempSrc: Oral  PainSc:                  Norm Salt

## 2020-10-08 NOTE — Progress Notes (Signed)
PROGRESS NOTE    Jasmine Colon  NLZ:[id number] DOB: 01-22-1941 DOA: 10/03/2020 PCP: Dion Body, MD   Brief Narrative: Taken from H&P. Jasmine Colon is a 80 y.o. female with medical history significant for Parkinson's disease, status post left hip hemiarthroplasty on 09/21/20 following a fall complicated by periprosthetic dislocation requiring closed reduction in the OR on 09/27/20.   Patient presents to the ER via EMS with complaints of left hip pain that occurred suddenly while she was sitting.  Found to have another superior dislocation of left hip arthroplasty. Patient is unable to keep her knee straight as advised by orthopedic. Orthopedic surgery is planning to take her to the OR on Monday for total hip arthroplasty.  Apparently Foley catheter was placed by orthopedic to prevent excessive movements and they were requesting to keep it in till after the surgery on Monday.  6/19:Patient accidentally pulled her catheter out last night resulted in some traumatic injury and hematuria. Currently on pure wick.  6/21: Had her total hip replacement yesterday-tolerated the procedure well. Stable for discharge back to SNF most likely tomorrow.  Subjective: Patient was seen and examined today.  Hip pain bearable.  Denies any other complaints.  Assessment & Plan:   Principal Problem:   Hip dislocation, left (HCC) Active Problems:   Left hip pain   Parkinson's disease ([place])   Malnutrition of moderate degree  Left Hip Dislocation.  Multiple dislocations of hemiarthroplasty.  S/p total hip replacement yesterday by orthopedic surgery. -Continue with pain management -PT/OT evaluation -We will be none weightbearing and she will use reduction pillow according to orthopedic surgery.  Gross hematuria.  Resolved. Secondary to trauma with accidental pulling of Foley catheter by patient overnight. -Monitor hemoglobin-currently stable -Continue with pure wick-we will avoid Foley  catheter.  Parkinson's disease/cognitive impairment. -Continue home dose of Sinemet and rasagiline.  Malnutrition of moderate degree (BMI 18) Continue nutritional supplements 3 times a day Maintain regular diet  Depression Continue mirtazapine  Objective: Vitals:   10/07/20 2227 10/07/20 2333 10/08/20 0425 10/08/20 0741  BP: (!) 80/55 (!) 88/60 (!) 88/57 91/63  Pulse: 87 91 94 97  Resp: 16 16 14 16   Temp: 98.4 F (36.9 C) 98.1 F (36.7 C) 98.2 F (36.8 C) 99.6 F (37.6 C)  TempSrc: Oral Oral  Oral  SpO2: 98% 94% 95% 96%  Weight:      Height:        Intake/Output Summary (Last 24 hours) at 10/08/2020 1442 Last data filed at 10/08/2020 0420 Gross per 24 hour  Intake 2901.97 ml  Output 835 ml  Net 2066.97 ml    Filed Weights   10/03/20 1929 10/07/20 1358  Weight: 49.9 kg 49.9 kg    Examination:  General.  Frail, cognitively impaired elderly lady, in no acute distress. Pulmonary.  Lungs clear bilaterally, normal respiratory effort. CV.  Regular rate and rhythm, no JVD, rub or murmur. Abdomen.  Soft, nontender, nondistended, BS positive. CNS.  Alert and oriented .  No focal neurologic deficit. Extremities.  No edema, no cyanosis, pulses intact and symmetrical, left hip clean bandage and drain. Psychiatry.  Judgment and insight appears impaired  DVT prophylaxis: Lovenox Code Status: Full Family Communication: No family at bedside Disposition Plan:  Status is: Inpatient  Remains inpatient appropriate because:Inpatient level of care appropriate due to severity of illness  Dispo: The patient is from: SNF              Anticipated d/c is to: SNF  Patient currently is not medically stable to d/c.   Difficult to place patient No              Level of care: Med-Surg  All the records are reviewed and case discussed with Care Management/Social Worker. Management plans discussed with the patient, nursing and they are in agreement.  Consultants:   Orthopedic  Procedures:  Antimicrobials:   Data Reviewed: I have personally reviewed following labs and imaging studies  CBC: Recent Labs  Lab 10/03/20 2010 10/04/20 0556 10/07/20 0429 10/08/20 0544  WBC 10.2 10.8* 11.3* 12.4*  NEUTROABS 7.2  --   --   --   HGB 9.4* 9.1* 10.5* 8.6*  HCT 28.5* 27.3* 32.5* 27.2*  MCV 98.3 98.6 100.6* 103.0*  PLT 585* 533* 650* 489*    Basic Metabolic Panel: Recent Labs  Lab 10/03/20 2050 10/04/20 0556 10/07/20 0429 10/08/20 0544  NA 135 134* 137 135  K 4.1 3.9 4.2 4.4  CL 104 102 101 104  CO2 24 28 29 25   GLUCOSE 97 93 96 110*  BUN 13 13 16 18   CREATININE 0.61 0.71 0.73 0.78  CALCIUM 8.4* 8.1* 8.2* 7.8*    GFR: Estimated Creatinine Clearance: 46.4 mL/min (by C-G formula based on SCr of 0.78 mg/dL). Liver Function Tests: No results for input(s): AST, ALT, ALKPHOS, BILITOT, PROT, ALBUMIN in the last 168 hours. No results for input(s): LIPASE, AMYLASE in the last 168 hours. No results for input(s): AMMONIA in the last 168 hours. Coagulation Profile: No results for input(s): INR, PROTIME in the last 168 hours. Cardiac Enzymes: No results for input(s): CKTOTAL, CKMB, CKMBINDEX, TROPONINI in the last 168 hours. BNP (last 3 results) No results for input(s): PROBNP in the last 8760 hours. HbA1C: No results for input(s): HGBA1C in the last 72 hours. CBG: No results for input(s): GLUCAP in the last 168 hours. Lipid Profile: No results for input(s): CHOL, HDL, LDLCALC, TRIG, CHOLHDL, LDLDIRECT in the last 72 hours. Thyroid Function Tests: No results for input(s): TSH, T4TOTAL, FREET4, T3FREE, THYROIDAB in the last 72 hours. Anemia Panel: Recent Labs    10/07/20 0429 10/07/20 0811  VITAMINB12  --  571  FOLATE  --  9.5  FERRITIN  --  205  TIBC  --  308  IRON  --  41  RETICCTPCT 4.3*  --     Sepsis Labs: No results for input(s): PROCALCITON, LATICACIDVEN in the last 168 hours.   Recent Results (from the past 240 hour(s))   Resp Panel by RT-PCR (Flu A&B, Covid) Nasopharyngeal Swab     Status: None   Collection Time: 09/30/20 11:33 AM   Specimen: Nasopharyngeal Swab; Nasopharyngeal(NP) swabs in vial transport medium  Result Value Ref Range Status   SARS Coronavirus 2 by RT PCR NEGATIVE NEGATIVE Final    Comment: (NOTE) SARS-CoV-2 target nucleic acids are NOT DETECTED.  The SARS-CoV-2 RNA is generally detectable in upper respiratory specimens during the acute phase of infection. The lowest concentration of SARS-CoV-2 viral copies this assay can detect is 138 copies/mL. A negative result does not preclude SARS-Cov-2 infection and should not be used as the sole basis for treatment or other patient management decisions. A negative result may occur with  improper specimen collection/handling, submission of specimen other than nasopharyngeal swab, presence of viral mutation(s) within the areas targeted by this assay, and inadequate number of viral copies(<138 copies/mL). A negative result must be combined with clinical observations, patient history, and epidemiological information. The expected result is  Negative.  Fact Sheet for Patients:  EntrepreneurPulse.com.au  Fact Sheet for Healthcare Providers:  IncredibleEmployment.be  This test is no t yet approved or cleared by the [place] FDA and  has been authorized for detection and/or diagnosis of SARS-CoV-2 by FDA under an Emergency Use Authorization (EUA). This EUA will remain  in effect (meaning this test can be used) for the duration of the COVID-19 declaration under Section 564(b)(1) of the Act, 21 U.S.C.section 360bbb-3(b)(1), unless the authorization is terminated  or revoked sooner.       Influenza A by PCR NEGATIVE NEGATIVE Final   Influenza B by PCR NEGATIVE NEGATIVE Final    Comment: (NOTE) The Xpert Xpress SARS-CoV-2/FLU/RSV plus assay is intended as an aid in the diagnosis of influenza from  Nasopharyngeal swab specimens and should not be used as a sole basis for treatment. Nasal washings and aspirates are unacceptable for Xpert Xpress SARS-CoV-2/FLU/RSV testing.  Fact Sheet for Patients: EntrepreneurPulse.com.au  Fact Sheet for Healthcare Providers: IncredibleEmployment.be  This test is not yet approved or cleared by the [place] FDA and has been authorized for detection and/or diagnosis of SARS-CoV-2 by FDA under an Emergency Use Authorization (EUA). This EUA will remain in effect (meaning this test can be used) for the duration of the COVID-19 declaration under Section 564(b)(1) of the Act, 21 U.S.C. section 360bbb-3(b)(1), unless the authorization is terminated or revoked.  Performed at [facility], [place], [place] [address]   SARS CORONAVIRUS 2 (TAT 6-24 HRS) Nasopharyngeal Nasopharyngeal Swab     Status: None   Collection Time: 10/03/20 11:50 PM   Specimen: Nasopharyngeal Swab  Result Value Ref Range Status   SARS Coronavirus 2 NEGATIVE NEGATIVE Final    Comment: (NOTE) SARS-CoV-2 target nucleic acids are NOT DETECTED.  The SARS-CoV-2 RNA is generally detectable in upper and lower respiratory specimens during the acute phase of infection. Negative results do not preclude SARS-CoV-2 infection, do not rule out co-infections with other pathogens, and should not be used as the sole basis for treatment or other patient management decisions. Negative results must be combined with clinical observations, patient history, and epidemiological information. The expected result is Negative.  Fact Sheet for Patients: SugarRoll.be  Fact Sheet for Healthcare Providers: https://www.woods-mathews.com/  This test is not yet approved or cleared by the [place] FDA and  has been authorized for detection and/or diagnosis of SARS-CoV-2 by FDA under an Emergency  Use Authorization (EUA). This EUA will remain  in effect (meaning this test can be used) for the duration of the COVID-19 declaration under Se ction 564(b)(1) of the Act, 21 U.S.C. section 360bbb-3(b)(1), unless the authorization is terminated or revoked sooner.  Performed at [facility], [place] [address], [place], [place] [address]       Radiology Studies: DG Pelvis Portable  Result Date: 10/07/2020 CLINICAL DATA:  History of left total hip replacement. EXAM: PORTABLE PELVIS 1-2 VIEWS COMPARISON:  Radiograph earlier today, 10/04/2020. FINDINGS: Revision left hip arthroplasty in expected alignment. There is a cerclage wire about the proximal femoral stem. No periprosthetic fracture. Recent postsurgical change includes air and edema in the soft tissues lateral skin staples. IMPRESSION: Revision left hip arthroplasty without immediate postoperative complication. Electronically Signed   By: Keith Rake M.D.   On: 10/07/2020 19:37   DG HIP PORT UNILAT WITH PELVIS 1V LEFT  Result Date: 10/07/2020 CLINICAL DATA:  Status post left hip replacement/revision EXAM: DG HIP (WITH OR WITHOUT PELVIS) 1V PORT LEFT  COMPARISON:  10/04/2020 FINDINGS: New left hip prosthesis is noted with acetabular component and fixation screws. No acute fracture is seen. No soft tissue abnormality is noted. IMPRESSION: Status post left hip revision Electronically Signed   By: Inez Catalina M.D.   On: 10/07/2020 18:39    Scheduled Meds:  aspirin  81 mg Oral BID   carbidopa-levodopa  1 tablet Oral 6 X Daily   Chlorhexidine Gluconate Cloth  6 each Topical Daily   docusate sodium  100 mg Oral BID   feeding supplement  237 mL Oral TID BM   ferrous [id number] C-folic acid  1 capsule Oral BID PC   methocarbamol  500 mg Oral QID   mirtazapine  30 mg Oral QHS   multivitamin-lutein   Oral Daily   rasagiline  1 mg Oral Daily   sodium chloride flush  3 mL Intravenous Q12H   Continuous Infusions:  sodium  chloride 250 mL (10/07/20 2255)   lactated ringers       LOS: 4 days   Time spent: 25 minutes. More than 50% of the time was spent in counseling/coordination of care  Lorella Nimrod, MD [facility]  If 7PM-7AM, please contact night-coverage [email or web address]  10/08/2020, 2:42 PM   This record has been created using Systems analyst. Errors have been sought and corrected,but may not always be located. Such creation errors do not reflect on the standard of care.

## 2020-10-08 NOTE — Plan of Care (Signed)

## 2020-10-08 NOTE — Progress Notes (Signed)
Subjective:  Patient reports pain as mild.  Her husband is in the room.   Objective:   VITALS:   Vitals:   10/07/20 2227 10/07/20 2333 10/08/20 0425 10/08/20 0741  BP: (!) 80/55 (!) 88/60 (!) 88/57 91/63  Pulse: 87 91 94 97  Resp: 16 16 14 16   Temp: 98.4 F (36.9 C) 98.1 F (36.7 C) 98.2 F (36.8 C) 99.6 F (37.6 C)  TempSrc: Oral Oral  Oral  SpO2: 98% 94% 95% 96%  Weight:      Height:        PHYSICAL EXAM:  ABD soft Sensation intact distally Dorsiflexion/Plantar flexion intact Incision: dressing C/D/I No cellulitis present Compartment soft  LABS  Results for orders placed or performed during the hospital encounter of 10/03/20 (from the past 24 hour(s))  CBC     Status: Abnormal   Collection Time: 10/08/20  5:44 AM  Result Value Ref Range   WBC 12.4 (H) 4.0 - 10.5 K/uL   RBC 2.64 (L) 3.87 - 5.11 MIL/uL   Hemoglobin 8.6 (L) 12.0 - 15.0 g/dL   HCT 27.2 (L) 36.0 - 46.0 %   MCV 103.0 (H) 80.0 - 100.0 fL   MCH 32.6 26.0 - 34.0 pg   MCHC 31.6 30.0 - 36.0 g/dL   RDW 15.3 11.5 - 15.5 %   Platelets 489 (H) 150 - 400 K/uL   nRBC 0.0 0.0 - 0.2 %  Basic metabolic panel     Status: Abnormal   Collection Time: 10/08/20  5:44 AM  Result Value Ref Range   Sodium 135 135 - 145 mmol/L   Potassium 4.4 3.5 - 5.1 mmol/L   Chloride 104 98 - 111 mmol/L   CO2 25 22 - 32 mmol/L   Glucose, Bld 110 (H) 70 - 99 mg/dL   BUN 18 8 - 23 mg/dL   Creatinine, Ser 0.78 0.44 - 1.00 mg/dL   Calcium 7.8 (L) 8.9 - 10.3 mg/dL   GFR, Estimated >60 >60 mL/min   Anion gap 6 5 - 15    DG Pelvis Portable  Result Date: 10/07/2020 CLINICAL DATA:  History of left total hip replacement. EXAM: PORTABLE PELVIS 1-2 VIEWS COMPARISON:  Radiograph earlier today, 10/04/2020. FINDINGS: Revision left hip arthroplasty in expected alignment. There is a cerclage wire about the proximal femoral stem. No periprosthetic fracture. Recent postsurgical change includes air and edema in the soft tissues lateral skin  staples. IMPRESSION: Revision left hip arthroplasty without immediate postoperative complication. Electronically Signed   By: Keith Rake M.D.   On: 10/07/2020 19:37   DG HIP PORT UNILAT WITH PELVIS 1V LEFT  Result Date: 10/07/2020 CLINICAL DATA:  Status post left hip replacement/revision EXAM: DG HIP (WITH OR WITHOUT PELVIS) 1V PORT LEFT COMPARISON:  10/04/2020 FINDINGS: New left hip prosthesis is noted with acetabular component and fixation screws. No acute fracture is seen. No soft tissue abnormality is noted. IMPRESSION: Status post left hip revision Electronically Signed   By: Inez Catalina M.D.   On: 10/07/2020 18:39    Assessment/Plan: 1 Day Post-Op   Principal Problem:   Hip dislocation, left (HCC) Active Problems:   Left hip pain   Parkinson's disease ([place])   Malnutrition of moderate degree   Discharge to SNF when bed available She must wear the abduction PILLOW at all times. She may be at the side of bed or in a chair while wearing the pillow. She is non-weight bearing on the left side.   Lovell Sheehan ,  MD 10/08/2020, 12:24 PM

## 2020-10-09 ENCOUNTER — Encounter: Payer: Self-pay | Admitting: Orthopedic Surgery

## 2020-10-09 LAB — CBC
HCT: 24.1 % — ABNORMAL LOW (ref 36.0–46.0)
Hemoglobin: 8 g/dL — ABNORMAL LOW (ref 12.0–15.0)
MCH: 32 pg (ref 26.0–34.0)
MCHC: 33.2 g/dL (ref 30.0–36.0)
MCV: 96.4 fL (ref 80.0–100.0)
Platelets: 460 10*3/uL — ABNORMAL HIGH (ref 150–400)
RBC: 2.5 MIL/uL — ABNORMAL LOW (ref 3.87–5.11)
RDW: 14.6 % (ref 11.5–15.5)
WBC: 11.2 10*3/uL — ABNORMAL HIGH (ref 4.0–10.5)
nRBC: 0 % (ref 0.0–0.2)

## 2020-10-09 MED ORDER — MIDODRINE HCL 5 MG PO TABS
2.5000 mg | ORAL_TABLET | Freq: Three times a day (TID) | ORAL | Status: DC
  Administered 2020-10-09: 2.5 mg via ORAL
  Filled 2020-10-09: qty 1

## 2020-10-09 NOTE — NC FL2 (Signed)
[place] LEVEL OF CARE SCREENING TOOL     IDENTIFICATION  Patient Name: Jasmine Colon Birthdate: [birth year] Sex: female Admission Date (Current Location): 10/03/2020  [facility] and [place] Number:  Engineering geologist and Address:  [facility], [address] [place], [place] [address]      Provider Number: [id number]  Attending Physician Name and Address:  Nolberto Hanlon, MD  Relative Name and Phone Number:  [name] Daughter [phone]    Current Level of Care: Hospital Recommended Level of Care: [place] Prior Approval Number:    Date Approved/Denied:   PASRR Number: [id number]  Discharge Plan: SNF    Current Diagnoses: Patient Active Problem List   Diagnosis Date Noted   Hip dislocation, left ([employer]) 10/03/2020   Dislocated hip ([place]) 09/28/2020   Dislocation of hip prosthesis, initial encounter ([place]) 09/27/2020   Dislocation of hip joint prosthesis, initial encounter ([place]) 09/27/2020   Postoperative anemia 09/27/2020   AKI (acute kidney injury) ([place]) 09/27/2020   Malnutrition of moderate degree 09/27/2020   Accidental fall    Hyperlipidemia    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   Left hip pain 07/19/2012   Back pain, lumbar 07/19/2012   Osteoporosis, unspecified 07/19/2012   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   Parkinson's disease ([place]) 07/19/2012    Orientation RESPIRATION BLADDER Height & Weight     Self, Place  Normal Continent Weight: 49.9 kg Height:  5\' 5"  (165.1 cm)  BEHAVIORAL SYMPTOMS/MOOD NEUROLOGICAL BOWEL NUTRITION STATUS      Continent Diet  AMBULATORY STATUS COMMUNICATION OF NEEDS Skin   Extensive Assist Verbally Surgical wounds                       Personal  Care Assistance Level of Assistance  Bathing, Feeding, Dressing Bathing Assistance: Limited assistance Feeding assistance: Limited assistance Dressing Assistance: Limited assistance     Functional Limitations Info  Sight, Hearing, Speech Sight Info: Adequate Hearing Info: Adequate Speech Info: Adequate    SPECIAL CARE FACTORS FREQUENCY  PT (By licensed PT), OT (By licensed OT)     PT Frequency: 5 times per week OT Frequency: 5 times per week            Contractures Contractures Info: Not present    Additional Factors Info  Code Status, Allergies Code Status Info: DNR Allergies Info: codeine, Pseudoepherine           Current Medications (10/09/2020):  This is the current hospital active medication list Current Facility-Administered Medications  Medication Dose Route Frequency Provider Last Rate Last Admin   0.9 %  sodium chloride infusion  250 mL Intravenous PRN Lovell Sheehan, MD 10 mL/hr at 10/07/20 2255 250 mL at 10/07/20 2255   acetaminophen (TYLENOL) tablet 650 mg  650 mg Oral Q8H PRN Lovell Sheehan, MD   650 mg at 10/04/20 2315   alum & mag hydroxide-simeth (MAALOX/MYLANTA) 200-200-20 MG/5ML suspension 30 mL  30 mL Oral Q4H PRN Lovell Sheehan, MD       aspirin chewable tablet 81 mg  81 mg Oral BID Lovell Sheehan, MD   81 mg at 10/08/20 2149   bisacodyl (DULCOLAX) suppository 10 mg  10 mg Rectal Daily PRN Lovell Sheehan,  MD       carbidopa-levodopa (SINEMET IR) 25-100 MG per tablet immediate release 1 tablet  1 tablet Oral 6 X Daily Lovell Sheehan, MD   1 tablet at 10/09/20 2956   Chlorhexidine Gluconate Cloth 2 % PADS 6 each  6 each Topical Daily Lovell Sheehan, MD   6 each at 10/07/20 1012   docusate sodium (COLACE) capsule 100 mg  100 mg Oral BID PRN Lovell Sheehan, MD   100 mg at 10/08/20 0114   docusate sodium (COLACE) capsule 100 mg  100 mg Oral BID Lovell Sheehan, MD   100 mg at 10/08/20 2149   feeding supplement (ENSURE ENLIVE / ENSURE PLUS) liquid  237 mL  237 mL Oral TID BM Lovell Sheehan, MD   237 mL at 10/08/20 2034   ferrous [id number] C-folic acid (TRINSICON / FOLTRIN) capsule 1 capsule  1 capsule Oral BID PC Lorella Nimrod, MD   1 capsule at 10/08/20 1702   fluticasone (FLONASE) 50 MCG/ACT nasal spray 2 spray  2 spray Each Nare Daily PRN Lovell Sheehan, MD   2 spray at 10/05/20 0049   HYDROcodone-acetaminophen (NORCO/VICODIN) 5-325 MG per tablet 1 tablet  1 tablet Oral Q6H PRN Lovell Sheehan, MD   1 tablet at 10/08/20 2033   lactated ringers infusion   Intravenous Continuous Lovell Sheehan, MD       loratadine (CLARITIN) tablet 10 mg  10 mg Oral Daily PRN Lovell Sheehan, MD       magnesium citrate solution 1 Bottle  1 Bottle Oral Once PRN Lovell Sheehan, MD       magnesium hydroxide (MILK OF MAGNESIA) suspension 30 mL  30 mL Oral Daily PRN Lovell Sheehan, MD       menthol-cetylpyridinium (CEPACOL) lozenge 3 mg  1 lozenge Oral PRN Lovell Sheehan, MD       Or   phenol (CHLORASEPTIC) mouth spray 1 spray  1 spray Mouth/Throat PRN Lovell Sheehan, MD       methocarbamol (ROBAXIN) tablet 500 mg  500 mg Oral QID Lovell Sheehan, MD   500 mg at 10/08/20 2155   metoCLOPramide (REGLAN) tablet 5-10 mg  5-10 mg Oral Q8H PRN Lovell Sheehan, MD       Or   metoCLOPramide (REGLAN) injection 5-10 mg  5-10 mg Intravenous Q8H PRN Lovell Sheehan, MD       mirtazapine (REMERON) tablet 30 mg  30 mg Oral QHS Lovell Sheehan, MD   30 mg at 10/08/20 2150   multivitamin-lutein (OCUVITE-LUTEIN) capsule   Oral Daily Lovell Sheehan, MD   1 capsule at 10/08/20 0908   ondansetron (ZOFRAN) tablet 4 mg  4 mg Oral Q6H PRN Lovell Sheehan, MD       Or   ondansetron [facility]) injection 4 mg  4 mg Intravenous Q6H PRN Lovell Sheehan, MD       rasagiline (AZILECT) tablet 1 mg  1 mg Oral Daily Lovell Sheehan, MD   1 mg at 10/08/20 4132   sodium chloride flush (NS) 0.9 % injection 3 mL  3 mL Intravenous Q12H Lovell Sheehan, MD   3 mL at 10/08/20 2150    sodium chloride flush (NS) 0.9 % injection 3 mL  3 mL Intravenous PRN Lovell Sheehan, MD   3 mL at 10/07/20 2251     Discharge Medications: Please see discharge summary for a list of discharge  medications.  Relevant Imaging Results:  Relevant Lab Results:   Additional Information [phone]  Su Hilt, RN

## 2020-10-09 NOTE — Progress Notes (Signed)
Physical Therapy Treatment Patient Details Name: Jasmine Colon MRN: [id number] DOB: 1940-11-27 Today's Date: 10/09/2020    History of Present Illness 80 y/o female s/p L femoral neck fracture, hip hemiarthroplasty 6/4.  She went to rehab and fell with a dislocation, closed reduction 6/10. Has had multiple dislocations and is now s/p total hip revision 6/20.  History of Parkinsons disease (takes meds 6xs/day), asthma, and HLD.    PT Comments    Pt struggled with all aspects of PT this date.  She could not verbalize or effectively describe any of her precautions despite constant reminders over the last few weeks and being able to at least name one yesterday.  Per orders ABD pillow at all times so we were able to do some limited essentially all isometric exercises, however she did not show very good execution - did have some improvement with extensive cuing. Given her lack of awareness, need for ABd pillow and NWBing on the L mobility will likely be extremely difficult and likely limited in the coming weeks.  Difficult case.  Follow Up Recommendations  SNF     Equipment Recommendations  Rolling walker with 5" wheels    Recommendations for Other Services       Precautions / Restrictions Precautions Precautions: Fall;Posterior Hip Precaution Comments: Pt could not name a single precuation today w/o extensive cuing Required Braces or Orthoses: Other Brace Other Brace: per orders Abd pillow at all times Restrictions LLE Weight Bearing: Non weight bearing    Mobility  Bed Mobility               General bed mobility comments: deferred per pt's inabiliy to grasp most safety precautions; poor awareness - safety and otherwise    Transfers                    Ambulation/Gait                 Stairs             Wheelchair Mobility    Modified Rankin (Stroke Patients Only)       Balance                                             Cognition Arousal/Alertness: Awake/alert Behavior During Therapy: WFL for tasks assessed/performed;Restless Overall Cognitive Status: Difficult to assess                                 General Comments: unable to name any precautions, unaware of current resitrictions, general confusion, etc      Exercises Total Joint Exercises Ankle Circles/Pumps: AROM;10 reps (resisted DF) Quad Sets: Strengthening;AROM;15 reps (poor awaeness that did improve with increased cuing and reps) Gluteal Sets: AROM;10 reps Short Arc Quad: AAROM;10 reps Hip ABduction/ADduction: AROM;10 reps (isometric with pillow donned)    General Comments        Pertinent Vitals/Pain Pain Score: 4  Pain Location: feels that pain in higher than she expected    Home Living                      Prior Function            PT Goals (current goals can now be found in the care plan section) Acute Rehab PT  Goals Patient Stated Goal: get back to walking PT Goal Formulation: With patient Time For Goal Achievement: 10/26/20 Potential to Achieve Goals: Poor Progress towards PT goals: Progressing toward goals    Frequency    Min 2X/week      PT Plan Current plan remains appropriate    Co-evaluation              AM-PAC PT "6 Clicks" Mobility   Outcome Measure  Help needed turning from your back to your side while in a flat bed without using bedrails?: Total Help needed moving from lying on your back to sitting on the side of a flat bed without using bedrails?: Total Help needed moving to and from a bed to a chair (including a wheelchair)?: Total Help needed standing up from a chair using your arms (e.g., wheelchair or bedside chair)?: Total Help needed to walk in hospital room?: Total Help needed climbing 3-5 steps with a railing? : Total 6 Click Score: 6    End of Session   Activity Tolerance: Patient limited by pain Patient left: with bed alarm set;with call bell/phone  within reach Nurse Communication: Mobility status PT Visit Diagnosis: Muscle weakness (generalized) (M62.81);History of falling (Z91.81);Difficulty in walking, not elsewhere classified (R26.2);Pain Pain - Right/Left: Left Pain - part of body: Hip     Time: 1505-6979 PT Time Calculation (min) (ACUTE ONLY): 26 min  Charges:  $Therapeutic Exercise: 8-22 mins $Therapeutic Activity: 8-22 mins                     Kreg Shropshire, DPT 10/09/2020, 10:55 AM

## 2020-10-09 NOTE — Evaluation (Signed)
Physical Therapy Evaluation Patient Details Name: Jasmine Colon MRN: [id number] DOB: Mar 23, 1941 Today's Date: 10/09/2020   History of Present Illness  80 y/o female s/p L femoral neck fracture, hip hemiarthroplasty 6/4.  She went to rehab and fell with a dislocation, closed reduction 6/10. Has had multiple dislocations and is now s/p total hip revision 6/20.  History of Parkinsons disease (takes meds 6xs/day), asthma, and HLD.  Clinical Impression  Late entry for post-op exam completed 6/21. Pt in bed, with NWBing precautions for L LE and orders for hip ABD pillow at all times.  Discussed with surgeon about goal/expectations, at this time seems like mobility/transfers/exercises/etc will be quite limited.  We were able to do some light exercises in bed, not appropriate for transfers at this stage.  Discussed precautions (which this PT discussed with her extensively while she was admitted after the first dislocations) and she was only able to recall 1 w/o cuing.  Stressed the importance of positioning and maintenance of NWBing.  Pt clearly frustrated with the situation and acknowledges that she has a difficult path of recovery ahead of her.    Follow Up Recommendations SNF    Equipment Recommendations  Rolling walker with 5" wheels    Recommendations for Other Services       Precautions / Restrictions Precautions Precautions: Fall;Posterior Hip Precaution Comments: Pt was able to name only 1 precaution w/o cuing, did come up with the others with minimal clues Required Braces or Orthoses: Other Brace Other Brace: per orders Abd pillow at all times Restrictions LLE Weight Bearing: Non weight bearing      Mobility  Bed Mobility               General bed mobility comments: deferred per MD and safety in general    Transfers                    Ambulation/Gait                Stairs            Wheelchair Mobility    Modified Rankin (Stroke Patients  Only)       Balance                                             Pertinent Vitals/Pain Pain Score: 7  Pain Location: feels that pain in higher than she expected    Home Living                        Prior Function                 Hand Dominance        Extremity/Trunk Assessment                Communication      Cognition Arousal/Alertness: Awake/alert Behavior During Therapy: WFL for tasks assessed/performed;Restless Overall Cognitive Status: Within Functional Limits for tasks assessed                                 General Comments: much less dyskinesia than last admit      General Comments      Exercises Total Joint Exercises Ankle Circles/Pumps: AROM;10 reps Quad Sets: AROM;10 reps Short Arc Quad: AAROM;10  reps   Assessment/Plan    PT Assessment    PT Problem List         PT Treatment Interventions      PT Goals (Current goals can be found in the Care Plan section)  Acute Rehab PT Goals Patient Stated Goal: get back to walking PT Goal Formulation: With patient Time For Goal Achievement: 10/26/20 Potential to Achieve Goals: Poor    Frequency Min 2X/week   Barriers to discharge        Co-evaluation               AM-PAC PT "6 Clicks" Mobility  Outcome Measure Help needed turning from your back to your side while in a flat bed without using bedrails?: Total Help needed moving from lying on your back to sitting on the side of a flat bed without using bedrails?: Total Help needed moving to and from a bed to a chair (including a wheelchair)?: Total Help needed standing up from a chair using your arms (e.g., wheelchair or bedside chair)?: Total Help needed to walk in hospital room?: Total Help needed climbing 3-5 steps with a railing? : Total 6 Click Score: 6    End of Session   Activity Tolerance: Patient limited by pain Patient left: with bed alarm set;with call bell/phone within  reach Nurse Communication: Mobility status PT Visit Diagnosis: Muscle weakness (generalized) (M62.81);History of falling (Z91.81);Difficulty in walking, not elsewhere classified (R26.2);Pain Pain - Right/Left: Left Pain - part of body: Hip    Time: 1127-1159 PT Time Calculation (min) (ACUTE ONLY): 32 min   Charges:   PT Evaluation $PT Eval Low Complexity: 1 Low PT Treatments $Therapeutic Exercise: 8-22 mins        Kreg Shropshire, DPT 10/09/2020, 8:00 AM

## 2020-10-09 NOTE — Progress Notes (Signed)
PROGRESS NOTE    Jasmine Colon  SFK:[id number] DOB: April 06, 1941 DOA: 10/03/2020 PCP: Dion Body, MD   Brief Narrative: Taken from H&P. Jasmine Colon is a 80 y.o. female with medical history significant for Parkinson's disease, status post left hip hemiarthroplasty on 09/21/20 following a fall complicated by periprosthetic dislocation requiring closed reduction in the OR on 09/27/20.   Patient presents to the ER via EMS with complaints of left hip pain that occurred suddenly while she was sitting.  Found to have another superior dislocation of left hip arthroplasty. Patient is unable to keep her knee straight as advised by orthopedic. Orthopedic surgery is planning to take her to the OR on Monday for total hip arthroplasty.  Apparently Foley catheter was placed by orthopedic to prevent excessive movements and they were requesting to keep it in till after the surgery on Monday.  6/19:Patient accidentally pulled her catheter out last night resulted in some traumatic injury and hematuria. Currently on pure wick.  6/21: Had her total hip replacement yesterday-tolerated the procedure well. Stable for discharge back to SNF most likely tomorrow.  6/22 SNF pending. Pt has no complaints. Denies cp, dizziness, lightheadedness.   Subjective: No cp or sob  Assessment & Plan:   Principal Problem:   Hip dislocation, left (HCC) Active Problems:   Left hip pain   Parkinson's disease (HCC)   Malnutrition of moderate degree  Left Hip Dislocation.  Multiple dislocations of hemiarthroplasty.  S/p total hip replacement yesterday by orthopedic surgery. -Continue with pain management 6/22-PT rec. SNF, -Non weightbearing on the left side and use reduction pillow at all times.  Continue  ASA 81 mg twice daily x30 days Pain management Follow-up in 2 weeks in office for staple removal and x-ray Call office for appointment [phone]   Gross hematuria.  Resolved. Secondary to trauma  with accidental pulling of Foley catheter by patient overnight. Continue with pure wick-we will avoid Foley catheter H&H stable continue to monitor  Hypotension- appears asx at rest Will add low dose midodrine 2.5mg  tid    Parkinson's disease/cognitive impairment. -Continue Sinemet and rasagiline    Malnutrition of moderate degree (BMI 18) Continue nutritional supplements TID Maintain regular diet   Depression Continue mirtazapine  Objective: Vitals:   10/08/20 1944 10/09/20 0349 10/09/20 0746 10/09/20 1121  BP: 107/70 108/63 109/62 102/66  Pulse: 93 92 90 95  Resp: 16 14 16 16   Temp: (!) 97.4 F (36.3 C) 98.8 F (37.1 C) 98.9 F (37.2 C) 98.3 F (36.8 C)  TempSrc:   Oral Oral  SpO2: 97% 94% 95% 97%  Weight:      Height:        Intake/Output Summary (Last 24 hours) at 10/09/2020 1326 Last data filed at 10/09/2020 1025 Gross per 24 hour  Intake 120 ml  Output 410 ml  Net -290 ml   Filed Weights   10/03/20 1929 10/07/20 1358  Weight: 49.9 kg 49.9 kg    Examination: Nad, calm Cta no w/r/r Regular s1/s2 no gallop Soft benign +bs No edema Awake and alert, grossly intact Mood and affect appropriate in current setting.   DVT prophylaxis: scd, asa bid Code Status: Full Family Communication: No family at bedside Disposition Plan:  Status is: Inpatient  Remains inpatient appropriate because:Inpatient level of care appropriate due to severity of illness  Dispo: The patient is from: SNF              Anticipated d/c is to: SNF  Patient currently is not medically stable.   Difficult to place patient No              Level of care: Med-Surg BP has been running low, starting midodrine. SNF pending.    Consultants:  Orthopedic  Procedures:  Antimicrobials:   Data Reviewed: I have personally reviewed following labs and imaging studies  CBC: Recent Labs  Lab 10/03/20 2010 10/04/20 0556 10/07/20 0429 10/08/20 0544 10/09/20 0436  WBC  10.2 10.8* 11.3* 12.4* 11.2*  NEUTROABS 7.2  --   --   --   --   HGB 9.4* 9.1* 10.5* 8.6* 8.0*  HCT 28.5* 27.3* 32.5* 27.2* 24.1*  MCV 98.3 98.6 100.6* 103.0* 96.4  PLT 585* 533* 650* 489* 341*   Basic Metabolic Panel: Recent Labs  Lab 10/03/20 2050 10/04/20 0556 10/07/20 0429 10/08/20 0544  NA 135 134* 137 135  K 4.1 3.9 4.2 4.4  CL 104 102 101 104  CO2 24 28 29 25   GLUCOSE 97 93 96 110*  BUN 13 13 16 18   CREATININE 0.61 0.71 0.73 0.78  CALCIUM 8.4* 8.1* 8.2* 7.8*   GFR: Estimated Creatinine Clearance: 46.4 mL/min (by C-G formula based on SCr of 0.78 mg/dL). Liver Function Tests: No results for input(s): AST, ALT, ALKPHOS, BILITOT, PROT, ALBUMIN in the last 168 hours. No results for input(s): LIPASE, AMYLASE in the last 168 hours. No results for input(s): AMMONIA in the last 168 hours. Coagulation Profile: No results for input(s): INR, PROTIME in the last 168 hours. Cardiac Enzymes: No results for input(s): CKTOTAL, CKMB, CKMBINDEX, TROPONINI in the last 168 hours. BNP (last 3 results) No results for input(s): PROBNP in the last 8760 hours. HbA1C: No results for input(s): HGBA1C in the last 72 hours. CBG: No results for input(s): GLUCAP in the last 168 hours. Lipid Profile: No results for input(s): CHOL, HDL, LDLCALC, TRIG, CHOLHDL, LDLDIRECT in the last 72 hours. Thyroid Function Tests: No results for input(s): TSH, T4TOTAL, FREET4, T3FREE, THYROIDAB in the last 72 hours. Anemia Panel: Recent Labs    10/07/20 0429 10/07/20 0811  VITAMINB12  --  571  FOLATE  --  9.5  FERRITIN  --  205  TIBC  --  308  IRON  --  41  RETICCTPCT 4.3*  --    Sepsis Labs: No results for input(s): PROCALCITON, LATICACIDVEN in the last 168 hours.   Recent Results (from the past 240 hour(s))  Resp Panel by RT-PCR (Flu A&B, Covid) Nasopharyngeal Swab     Status: None   Collection Time: 09/30/20 11:33 AM   Specimen: Nasopharyngeal Swab; Nasopharyngeal(NP) swabs in vial transport  medium  Result Value Ref Range Status   SARS Coronavirus 2 by RT PCR NEGATIVE NEGATIVE Final    Comment: (NOTE) SARS-CoV-2 target nucleic acids are NOT DETECTED.  The SARS-CoV-2 RNA is generally detectable in upper respiratory specimens during the acute phase of infection. The lowest concentration of SARS-CoV-2 viral copies this assay can detect is 138 copies/mL. A negative result does not preclude SARS-Cov-2 infection and should not be used as the sole basis for treatment or other patient management decisions. A negative result may occur with  improper specimen collection/handling, submission of specimen other than nasopharyngeal swab, presence of viral mutation(s) within the areas targeted by this assay, and inadequate number of viral copies(<138 copies/mL). A negative result must be combined with clinical observations, patient history, and epidemiological information. The expected result is Negative.  Fact Sheet for Patients:  EntrepreneurPulse.com.au  Fact Sheet for Healthcare Providers:  IncredibleEmployment.be  This test is no t yet approved or cleared by the [place] FDA and  has been authorized for detection and/or diagnosis of SARS-CoV-2 by FDA under an Emergency Use Authorization (EUA). This EUA will remain  in effect (meaning this test can be used) for the duration of the COVID-19 declaration under Section 564(b)(1) of the Act, 21 U.S.C.section 360bbb-3(b)(1), unless the authorization is terminated  or revoked sooner.       Influenza A by PCR NEGATIVE NEGATIVE Final   Influenza B by PCR NEGATIVE NEGATIVE Final    Comment: (NOTE) The Xpert Xpress SARS-CoV-2/FLU/RSV plus assay is intended as an aid in the diagnosis of influenza from Nasopharyngeal swab specimens and should not be used as a sole basis for treatment. Nasal washings and aspirates are unacceptable for Xpert Xpress SARS-CoV-2/FLU/RSV testing.  Fact Sheet for  Patients: EntrepreneurPulse.com.au  Fact Sheet for Healthcare Providers: IncredibleEmployment.be  This test is not yet approved or cleared by the [place] FDA and has been authorized for detection and/or diagnosis of SARS-CoV-2 by FDA under an Emergency Use Authorization (EUA). This EUA will remain in effect (meaning this test can be used) for the duration of the COVID-19 declaration under Section 564(b)(1) of the Act, 21 U.S.C. section 360bbb-3(b)(1), unless the authorization is terminated or revoked.  Performed at [facility], [place], [place] [address]   SARS CORONAVIRUS 2 (TAT 6-24 HRS) Nasopharyngeal Nasopharyngeal Swab     Status: None   Collection Time: 10/03/20 11:50 PM   Specimen: Nasopharyngeal Swab  Result Value Ref Range Status   SARS Coronavirus 2 NEGATIVE NEGATIVE Final    Comment: (NOTE) SARS-CoV-2 target nucleic acids are NOT DETECTED.  The SARS-CoV-2 RNA is generally detectable in upper and lower respiratory specimens during the acute phase of infection. Negative results do not preclude SARS-CoV-2 infection, do not rule out co-infections with other pathogens, and should not be used as the sole basis for treatment or other patient management decisions. Negative results must be combined with clinical observations, patient history, and epidemiological information. The expected result is Negative.  Fact Sheet for Patients: SugarRoll.be  Fact Sheet for Healthcare Providers: https://www.woods-mathews.com/  This test is not yet approved or cleared by the [place] FDA and  has been authorized for detection and/or diagnosis of SARS-CoV-2 by FDA under an Emergency Use Authorization (EUA). This EUA will remain  in effect (meaning this test can be used) for the duration of the COVID-19 declaration under Se ction 564(b)(1) of the Act, 21 U.S.C. section  360bbb-3(b)(1), unless the authorization is terminated or revoked sooner.  Performed at [facility], [place] [address], [place], [place] [address]       Radiology Studies: DG Pelvis Portable  Result Date: 10/07/2020 CLINICAL DATA:  History of left total hip replacement. EXAM: PORTABLE PELVIS 1-2 VIEWS COMPARISON:  Radiograph earlier today, 10/04/2020. FINDINGS: Revision left hip arthroplasty in expected alignment. There is a cerclage wire about the proximal femoral stem. No periprosthetic fracture. Recent postsurgical change includes air and edema in the soft tissues lateral skin staples. IMPRESSION: Revision left hip arthroplasty without immediate postoperative complication. Electronically Signed   By: Keith Rake M.D.   On: 10/07/2020 19:37   DG HIP PORT UNILAT WITH PELVIS 1V LEFT  Result Date: 10/07/2020 CLINICAL DATA:  Status post left hip replacement/revision EXAM: DG HIP (WITH OR WITHOUT PELVIS) 1V PORT LEFT COMPARISON:  10/04/2020 FINDINGS: New left hip prosthesis is  noted with acetabular component and fixation screws. No acute fracture is seen. No soft tissue abnormality is noted. IMPRESSION: Status post left hip revision Electronically Signed   By: Inez Catalina M.D.   On: 10/07/2020 18:39    Scheduled Meds:  aspirin  81 mg Oral BID   carbidopa-levodopa  1 tablet Oral 6 X Daily   Chlorhexidine Gluconate Cloth  6 each Topical Daily   docusate sodium  100 mg Oral BID   feeding supplement  237 mL Oral TID BM   ferrous [id number] C-folic acid  1 capsule Oral BID PC   methocarbamol  500 mg Oral QID   mirtazapine  30 mg Oral QHS   multivitamin-lutein   Oral Daily   rasagiline  1 mg Oral Daily   sodium chloride flush  3 mL Intravenous Q12H   Continuous Infusions:  sodium chloride 250 mL (10/07/20 2255)   lactated ringers       LOS: 5 days   Time spent: 45 minutes.with >50 % on coc   Nolberto Hanlon, MD [facility]  If 7PM-7AM, please contact  night-coverage [email or web address]  10/09/2020, 1:26 PM

## 2020-10-09 NOTE — TOC Progression Note (Signed)
Transition of Care [facility]) - Progression Note    Patient Details  Name: Jasmine Colon MRN: [id number] Date of Birth: 28-Sep-1940  Transition of Care [facility]) CM/SW [place], RN Phone Number: 10/09/2020, 10:07 AM  Clinical Narrative:     Spoke with the patient and she is animate that she is not going to rehab, I tried to encourage her to go to SN, she states that she does not want to go to rehab and she wants to go home, I called her daughter [name] and explained that her there does not want to go to rehab and with being non weight bearing that she may not get insurance approval, she agrees, she would like to come and have PT show her the proper way to manage the patient with her hip and the precautions, She will talk with her dad and her brother and requested that I get [place] set up for home, the patient has a 3 in 1 and a RW at home       Expected Discharge Plan and Services                                                 Social Determinants of Health (SDOH) Interventions    Readmission Risk Interventions No flowsheet data found.

## 2020-10-09 NOTE — Progress Notes (Signed)
  Subjective:  Patient reports pain as mild to moderate.    Objective:   VITALS:   Vitals:   10/08/20 1614 10/08/20 1745 10/08/20 1944 10/09/20 0349  BP: (!) 80/56 92/60 107/70 108/63  Pulse:  97 93 92  Resp:   16 14  Temp:   (!) 97.4 F (36.3 C) 98.8 F (37.1 C)  TempSrc:      SpO2:   97% 94%  Weight:      Height:        PHYSICAL EXAM:  Neurologically intact ABD soft Neurovascular intact Sensation intact distally Intact pulses distally Dorsiflexion/Plantar flexion intact Incision: scant drainage No cellulitis present Compartment soft Drsg changed and drain removed  LABS  Results for orders placed or performed during the hospital encounter of 10/03/20 (from the past 24 hour(s))  CBC     Status: Abnormal   Collection Time: 10/09/20  4:36 AM  Result Value Ref Range   WBC 11.2 (H) 4.0 - 10.5 K/uL   RBC 2.50 (L) 3.87 - 5.11 MIL/uL   Hemoglobin 8.0 (L) 12.0 - 15.0 g/dL   HCT 24.1 (L) 36.0 - 46.0 %   MCV 96.4 80.0 - 100.0 fL   MCH 32.0 26.0 - 34.0 pg   MCHC 33.2 30.0 - 36.0 g/dL   RDW 14.6 11.5 - 15.5 %   Platelets 460 (H) 150 - 400 K/uL   nRBC 0.0 0.0 - 0.2 %    DG Pelvis Portable  Result Date: 10/07/2020 CLINICAL DATA:  History of left total hip replacement. EXAM: PORTABLE PELVIS 1-2 VIEWS COMPARISON:  Radiograph earlier today, 10/04/2020. FINDINGS: Revision left hip arthroplasty in expected alignment. There is a cerclage wire about the proximal femoral stem. No periprosthetic fracture. Recent postsurgical change includes air and edema in the soft tissues lateral skin staples. IMPRESSION: Revision left hip arthroplasty without immediate postoperative complication. Electronically Signed   By: Keith Rake M.D.   On: 10/07/2020 19:37   DG HIP PORT UNILAT WITH PELVIS 1V LEFT  Result Date: 10/07/2020 CLINICAL DATA:  Status post left hip replacement/revision EXAM: DG HIP (WITH OR WITHOUT PELVIS) 1V PORT LEFT COMPARISON:  10/04/2020 FINDINGS: New left hip  prosthesis is noted with acetabular component and fixation screws. No acute fracture is seen. No soft tissue abnormality is noted. IMPRESSION: Status post left hip revision Electronically Signed   By: Inez Catalina M.D.   On: 10/07/2020 18:39    Assessment/Plan: 2 Days Post-Op   Principal Problem:   Hip dislocation, left (HCC) Active Problems:   Left hip pain   Parkinson's disease ([place])   Malnutrition of moderate degree   Advance diet Discharge to SNF when bed available She must wear the abduction PILLOW at all times. She may be at the side of bed or in a chair while wearing the pillow. She is non-weight bearing on the left side. Continue ASA 81 mg BID X 30 days Continue hydrocodone for pain Follow up in 2 weeks in office for staple removal and x-ray Call office for appointment [place] , PA-C 10/09/2020, 7:02 AM

## 2020-10-09 NOTE — Progress Notes (Addendum)
Patient has bedbound, Non Weight Bearing which requires upper and lower body to be positioned in ways not feasible with a normal bed. Head must be elevated at least 30 degrees or Hip may become dislocated again.  Hip dislocation requires frequent changes in body position which cannot be achieved with a normal bed.

## 2020-10-09 NOTE — TOC Progression Note (Signed)
Transition of Care [facility]) - Progression Note    Patient Details  Name: Jasmine Colon MRN: [id number] Date of Birth: 1940/07/03  Transition of Care [facility]) CM/SW [place], RN Phone Number: 10/09/2020, 9:22 AM  Clinical Narrative:    Spoke with the patient's daughter [name], she stated that they really want her to go back to [facility], She asked that I speak to the patient and see if she will agree and then call her back.          Expected Discharge Plan and Services                                                 Social Determinants of Health (SDOH) Interventions    Readmission Risk Interventions No flowsheet data found.

## 2020-10-09 NOTE — Care Management Important Message (Signed)
Important Message  Patient Details  Name: Jasmine Colon MRN: [id number] Date of Birth: 04/19/1941   Medicare Important Message Given:  Yes     Juliann Pulse A Willye Javier 10/09/2020, 11:20 AM

## 2020-10-09 NOTE — TOC Progression Note (Addendum)
Transition of Care [facility]) - Progression Note    Patient Details  Name: Jasmine Colon MRN: [id number] Date of Birth: 1940-08-02  Transition of Care [facility]) CM/SW [place], RN Phone Number: 10/09/2020, 1:15 PM  Clinical Narrative:     Damaris Schooner with [name] the patients daughter she stated that her dad can not lift her and they are not able to care for her, I encouraged her to hire an aide if possible to help. She stated that her brother is coming and they will speak with the patient and see if they can talk her intop rehab, I explained that then the challenge would be to get it covered by insurance, the patient's husband came and requested to speak with me, I explained to him that the patient does not want to go to rehab, He stated that their son and daughter are coming this evening to talk the patient into goint o rehab, I explained that due to the patient being non weight bearing she is really not a good candidate for STR for Rehab, she is more of a custodial care patient until her hip heals enough to do rehab, I explained that insurance does not cover custodial care, He asked if he could force the patient to go rehab against her wishes, I explained that as long as she is alert and oriented even the POA that the daughter has will not over ride the patient's decision, he stated understanding. I explained that the Doctor does not want her doing weight bearing and therefore does not want PT but does want her to have someone checking her skin for break down regularly, I encouraged him again to hire someone to help them care for her at home if possible, he stated that they are working on that. I explained that I will get a hospital bed ordered and set up for delivery for the patient if Insurance will approve, he stated he understood,       Expected Discharge Plan and Services                                                 Social Determinants of Health (SDOH)  Interventions    Readmission Risk Interventions No flowsheet data found.

## 2020-10-09 NOTE — Plan of Care (Signed)

## 2020-10-09 NOTE — TOC Progression Note (Signed)
Transition of Care [facility]) - Progression Note    Patient Details  Name: Oria Klimas MRN: [id number] Date of Birth: Jul 29, 1940  Transition of Care [facility]) CM/SW [place], RN Phone Number: 10/09/2020, 8:58 AM  Clinical Narrative:     Called The patient's spouse and left a voice mail for a call back to discuss Plan, [place] and spoke to [place], she stated that they plan to accept the patient back for short term rehab once she discharges, a new auth will need to be gotten and Fl2 completed, Seth Bake will be out of the office so when she is ready to DC call [facility] at [phone]       Expected Discharge Plan and Services                                                 Social Determinants of Health (SDOH) Interventions    Readmission Risk Interventions No flowsheet data found.

## 2020-10-10 MED ORDER — POLYETHYLENE GLYCOL 3350 17 G PO PACK
17.0000 g | PACK | Freq: Every day | ORAL | Status: DC
  Filled 2020-10-10: qty 1

## 2020-10-10 MED ORDER — METHOCARBAMOL 500 MG PO TABS: 500.0000 mg | ORAL_TABLET | Freq: Four times a day (QID) | ORAL | 0 refills | Status: AC

## 2020-10-10 MED ORDER — OXYCODONE HCL 5 MG PO TABS: 5.0000 mg | ORAL_TABLET | ORAL | 0 refills | Status: AC | PRN

## 2020-10-10 MED ORDER — MIDODRINE HCL 10 MG PO TABS: 10.0000 mg | ORAL_TABLET | Freq: Three times a day (TID) | ORAL | 0 refills | Status: AC

## 2020-10-10 MED ORDER — DOCUSATE SODIUM 100 MG PO CAPS: 100.0000 mg | ORAL_CAPSULE | Freq: Two times a day (BID) | ORAL | Status: DC

## 2020-10-10 MED ORDER — MIDODRINE HCL 5 MG PO TABS
10.0000 mg | ORAL_TABLET | Freq: Three times a day (TID) | ORAL | Status: DC
  Administered 2020-10-10 – 2020-10-11 (×3): 10 mg via ORAL
  Filled 2020-10-10 (×3): qty 2

## 2020-10-10 MED ORDER — DOCUSATE SODIUM 100 MG PO CAPS: 100.0000 mg | ORAL_CAPSULE | Freq: Two times a day (BID) | ORAL | 0 refills | Status: AC | PRN

## 2020-10-10 MED ORDER — MIDODRINE HCL 5 MG PO TABS
5.0000 mg | ORAL_TABLET | Freq: Three times a day (TID) | ORAL | Status: DC
  Administered 2020-10-10 (×2): 5 mg via ORAL
  Filled 2020-10-10: qty 1

## 2020-10-10 MED ORDER — ASPIRIN 81 MG PO CHEW: 81.0000 mg | CHEWABLE_TABLET | Freq: Two times a day (BID) | ORAL | 0 refills | Status: AC

## 2020-10-10 NOTE — TOC Progression Note (Addendum)
Transition of Care [facility]) - Progression Note    Patient Details  Name: Jasmine Colon MRN: [id number] Date of Birth: 11-11-1940  Transition of Care [facility]) CM/SW [place], RN Phone Number: 10/10/2020, 4:05 PM  Clinical Narrative:    Jasmine Colon with [name] the daughter and explained that a rse aide with [facility] is only a couple of times a week, she stated understadning I called [facility] they are unable to accept the patient, Requested [facility], [facility] and [facility], awaiting response, [facility] has said no, [facility] said no, I called [facility] and they will let me know,  They are unable to accept the patient I checked with [facility] on the delivery of the Bed, it has not been delivered yet still planning to deliver today Auth for EMS is [id number], patient will have to DC tomorrow due to late day delivery and transportation not able to get set up         Expected Discharge Plan and Services                                                 Social Determinants of Health (SDOH) Interventions    Readmission Risk Interventions No flowsheet data found.

## 2020-10-10 NOTE — Progress Notes (Signed)
Physical Therapy Treatment Patient Details Name: Jasmine Colon MRN: [id number] DOB: May 28, 1940 Today's Date: 10/10/2020    History of Present Illness 80 y/o female s/p L femoral neck fracture, hip hemiarthroplasty 6/4.  She went to rehab and fell with a dislocation, closed reduction 6/10. Has had multiple dislocations and is now s/p total hip revision 6/20.  History of Parkinsons disease (takes meds 6xs/day), asthma, and HLD.    PT Comments    Second session this AM with focus on transition home.  Extensive discussion with daughter regarding many aspects of safety, percautions, care, etc at home.  See below for further discussion detail.  Did educate and perform bed rolls on/off bed pan, pt unsuccessful in having BM.     Follow Up Recommendations  SNF;Supervision/Assistance - 24 hour (would benefit from HHPT if/when returning home)     [facility] bed (purewick)    Recommendations for Other Services       Precautions / Restrictions Precautions Precautions: Fall;Posterior Hip Precaution Booklet Issued: Yes (comment) Precaution Comments: Pt with better awareness of general ideas re: precautions but needed cues to actually recall all 3 Required Braces or Orthoses: Other Brace Other Brace: per orders Abd pillow at all times Restrictions LLE Weight Bearing: Non weight bearing    Mobility  Bed Mobility Overal bed mobility: Needs Assistance Bed Mobility: Rolling Rolling: Mod assist   Supine to sit: Mod assist;+2 for physical assistance Sit to supine: Mod assist;+2 for physical assistance   General bed mobility comments: education and performance of rolling/turns with daughter attempting to get on/off of bed pan    Transfers Overall transfer level: Needs assistance Equipment used: Rolling walker (2 wheeled) Transfers: Sit to/from Stand Sit to Stand: Mod assist;+2 physical assistance         General transfer comment: performed this AM, deferred  second session  Ambulation/Gait             General Gait Details: no true ambulation, but she was actually able to heel-toe shift ~8" along the EOB while maintaining L NWBing   Stairs             Wheelchair Mobility    Modified Rankin (Stroke Patients Only)       Balance Overall balance assessment: Needs assistance Sitting-balance support: Bilateral upper extremity supported Sitting balance-Leahy Scale: Good Sitting balance - Comments: Pt able to maintain sitting, ABd pillow donned   Standing balance support: Bilateral upper extremity supported Standing balance-Leahy Scale: Poor Standing balance comment: heavy UE use, +2 assist, however she did better than expected and was actually able to maintain NWBing on the L with EOB standing/acts                            Cognition Arousal/Alertness: Awake/alert Behavior During Therapy: WFL for tasks assessed/performed Overall Cognitive Status: Difficult to assess                                 General Comments: Pt seems somewhat more aware today, still clearly not fully aware of situation, precautions      Exercises Total Joint Exercises Ankle Circles/Pumps: AROM;10 reps (resisted DF) Quad Sets: Strengthening;AROM;15 reps Hip ABduction/ADduction: AROM;10 reps (isometric in ABD pillow) Other Exercises Other Exercises: extensive discussion with daughter and pt about appropriate care for her at home.  Education about precautions, positioning, bed mobility, pressure/wound considerations, expected course  of recovery, DME, safety considerations; all questions answered    General Comments        Pertinent Vitals/Pain Pain Assessment: Faces Faces Pain Scale: Hurts little more Pain Location: L hip    Home Living                      Prior Function            PT Goals (current goals can now be found in the care plan section) Progress towards PT goals: Progressing toward goals     Frequency    7X/week      PT Plan Current plan remains appropriate    Co-evaluation              AM-PAC PT "6 Clicks" Mobility   Outcome Measure  Help needed turning from your back to your side while in a flat bed without using bedrails?: A Lot Help needed moving from lying on your back to sitting on the side of a flat bed without using bedrails?: Total Help needed moving to and from a bed to a chair (including a wheelchair)?: Total Help needed standing up from a chair using your arms (e.g., wheelchair or bedside chair)?: A Lot Help needed to walk in hospital room?: Total Help needed climbing 3-5 steps with a railing? : Total 6 Click Score: 8    End of Session Equipment Utilized During Treatment: Gait belt Activity Tolerance: Patient tolerated treatment well Patient left: with bed alarm set;with call bell/phone within reach Nurse Communication: Mobility status PT Visit Diagnosis: Muscle weakness (generalized) (M62.81);History of falling (Z91.81);Difficulty in walking, not elsewhere classified (R26.2);Pain Pain - Right/Left: Left Pain - part of body: Hip     Time: 9201-0071 PT Time Calculation (min) (ACUTE ONLY): 35 min  Charges:  $Therapeutic Exercise: 8-22 mins $Therapeutic Activity: 23-37 mins                     Kreg Shropshire, DPT 10/10/2020, 2:51 PM

## 2020-10-10 NOTE — Progress Notes (Signed)
PROGRESS NOTE    Jasmine Colon  BSW:[id number] DOB: Jun 04, 1940 DOA: 10/03/2020 PCP: Dion Body, MD   Brief Narrative: Taken from H&P. Jasmine Colon is a 80 y.o. female with medical history significant for Parkinson's disease, status post left hip hemiarthroplasty on 09/21/20 following a fall complicated by periprosthetic dislocation requiring closed reduction in the OR on 09/27/20.   Patient presents to the ER via EMS with complaints of left hip pain that occurred suddenly while she was sitting.  Found to have another superior dislocation of left hip arthroplasty. Patient is unable to keep her knee straight as advised by orthopedic. Orthopedic surgery is planning to take her to the OR on Monday for total hip arthroplasty.  Apparently Foley catheter was placed by orthopedic to prevent excessive movements and they were requesting to keep it in till after the surgery on Monday.  6/19:Patient accidentally pulled her catheter out last night resulted in some traumatic injury and hematuria. Currently on pure wick.  6/21: Had her total hip replacement yesterday-tolerated the procedure well. Stable for discharge back to SNF most likely tomorrow.  6/22 SNF pending. Pt has no complaints. Denies cp, dizziness, lightheadedness.   6/23-was constipated this am, but finally had bm with bowel regimen given. Bed not delivered at home yet  Subjective: Has no chest pain or shortness of breath  Assessment & Plan:   Principal Problem:   Hip dislocation, left (HCC) Active Problems:   Left hip pain   Parkinson's disease (HCC)   Malnutrition of moderate degree  Left Hip Dislocation.  Multiple dislocations of hemiarthroplasty.  S/p total hip replacement yesterday by orthopedic surgery. -Continue with pain management 6/22-PT rec. SNF, -Non weightbearing on the left side and use reduction pillow at all times.  Continue  6/23 aspirin 81 mg twice daily x30 days  Pain management  Follow-up  in 2 weeks in office for staple removal and x-ray      Gross hematuria.  Resolved. Secondary to trauma with accidental pulling of Foley catheter by patient overnight. Continue with pure wick-we will avoid Foley catheter 6/23 H&H stable.  Continue to monitor periodically  Hypotension- appears asx at rest 6/23 BP little higher with midodrine.  Will increase to 5 mg 3 times daily    Parkinson's disease/cognitive impairment. -Continue Sinemet and rasagiline     Malnutrition of moderate degree (BMI 18) Continue nutritional supplements TID Maintain regular diet   Depression Continue mirtazapine  Objective: Vitals:   10/10/20 0434 10/10/20 0813 10/10/20 1226 10/10/20 1551  BP: (!) 96/58 108/65 103/67 102/65  Pulse: 86 86 95 96  Resp: 16 (!) 22 18 16   Temp: 98.4 F (36.9 C) 99.8 F (37.7 C) 98.1 F (36.7 C) 98.4 F (36.9 C)  TempSrc: Oral Oral    SpO2: 98% 95% 95% 95%  Weight:      Height:        Intake/Output Summary (Last 24 hours) at 10/10/2020 1743 Last data filed at 10/10/2020 1356 Gross per 24 hour  Intake 120 ml  Output 400 ml  Net -280 ml   Filed Weights   10/03/20 1929 10/07/20 1358  Weight: 49.9 kg 49.9 kg    Examination: Calm, NAD CTA no wheeze rales rhonchi's Regular S1-S2 no gallops Soft benign positive bowel sounds No edema Awake and alert and oriented   DVT prophylaxis: scd, asa bid Code Status: Full Family Communication: No family at bedside Disposition Plan:  Status is: Inpatient  Remains inpatient appropriate because: Unsafe discharge  dispo:  The patient is from: SNF              Anticipated d/c is to: Home.  Awaiting for bed to be delivered              Patient currently is medically stable   Difficult to place patient No              Level of care: Med-Surg    Consultants:  Orthopedic  Procedures:  Antimicrobials:   Data Reviewed: I have personally reviewed following labs and imaging studies  CBC: Recent Labs  Lab  10/03/20 2010 10/04/20 0556 10/07/20 0429 10/08/20 0544 10/09/20 0436  WBC 10.2 10.8* 11.3* 12.4* 11.2*  NEUTROABS 7.2  --   --   --   --   HGB 9.4* 9.1* 10.5* 8.6* 8.0*  HCT 28.5* 27.3* 32.5* 27.2* 24.1*  MCV 98.3 98.6 100.6* 103.0* 96.4  PLT 585* 533* 650* 489* 643*   Basic Metabolic Panel: Recent Labs  Lab 10/03/20 2050 10/04/20 0556 10/07/20 0429 10/08/20 0544  NA 135 134* 137 135  K 4.1 3.9 4.2 4.4  CL 104 102 101 104  CO2 24 28 29 25   GLUCOSE 97 93 96 110*  BUN 13 13 16 18   CREATININE 0.61 0.71 0.73 0.78  CALCIUM 8.4* 8.1* 8.2* 7.8*   GFR: Estimated Creatinine Clearance: 46.4 mL/min (by C-G formula based on SCr of 0.78 mg/dL). Liver Function Tests: No results for input(s): AST, ALT, ALKPHOS, BILITOT, PROT, ALBUMIN in the last 168 hours. No results for input(s): LIPASE, AMYLASE in the last 168 hours. No results for input(s): AMMONIA in the last 168 hours. Coagulation Profile: No results for input(s): INR, PROTIME in the last 168 hours. Cardiac Enzymes: No results for input(s): CKTOTAL, CKMB, CKMBINDEX, TROPONINI in the last 168 hours. BNP (last 3 results) No results for input(s): PROBNP in the last 8760 hours. HbA1C: No results for input(s): HGBA1C in the last 72 hours. CBG: No results for input(s): GLUCAP in the last 168 hours. Lipid Profile: No results for input(s): CHOL, HDL, LDLCALC, TRIG, CHOLHDL, LDLDIRECT in the last 72 hours. Thyroid Function Tests: No results for input(s): TSH, T4TOTAL, FREET4, T3FREE, THYROIDAB in the last 72 hours. Anemia Panel: No results for input(s): VITAMINB12, FOLATE, FERRITIN, TIBC, IRON, RETICCTPCT in the last 72 hours.  Sepsis Labs: No results for input(s): PROCALCITON, LATICACIDVEN in the last 168 hours.   Recent Results (from the past 240 hour(s))  SARS CORONAVIRUS 2 (TAT 6-24 HRS) Nasopharyngeal Nasopharyngeal Swab     Status: None   Collection Time: 10/03/20 11:50 PM   Specimen: Nasopharyngeal Swab  Result Value  Ref Range Status   SARS Coronavirus 2 NEGATIVE NEGATIVE Final    Comment: (NOTE) SARS-CoV-2 target nucleic acids are NOT DETECTED.  The SARS-CoV-2 RNA is generally detectable in upper and lower respiratory specimens during the acute phase of infection. Negative results do not preclude SARS-CoV-2 infection, do not rule out co-infections with other pathogens, and should not be used as the sole basis for treatment or other patient management decisions. Negative results must be combined with clinical observations, patient history, and epidemiological information. The expected result is Negative.  Fact Sheet for Patients: SugarRoll.be  Fact Sheet for Healthcare Providers: https://www.woods-mathews.com/  This test is not yet approved or cleared by the [place] FDA and  has been authorized for detection and/or diagnosis of SARS-CoV-2 by FDA under an Emergency Use Authorization (EUA). This EUA will remain  in effect (meaning this test  can be used) for the duration of the COVID-19 declaration under Se ction 564(b)(1) of the Act, 21 U.S.C. section 360bbb-3(b)(1), unless the authorization is terminated or revoked sooner.  Performed at [facility], [place] [address] [place], [place] [address]       Radiology Studies: No results found.  Scheduled Meds:  aspirin  81 mg Oral BID   carbidopa-levodopa  1 tablet Oral 6 X Daily   Chlorhexidine Gluconate Cloth  6 each Topical Daily   docusate sodium  100 mg Oral BID   feeding supplement  237 mL Oral TID BM   ferrous [id number] C-folic acid  1 capsule Oral BID PC   methocarbamol  500 mg Oral QID   midodrine  10 mg Oral TID WC   mirtazapine  30 mg Oral QHS   multivitamin-lutein   Oral Daily   polyethylene glycol  17 g Oral Daily   rasagiline  1 mg Oral Daily   sodium chloride flush  3 mL Intravenous Q12H   Continuous Infusions:  sodium chloride 250 mL (10/07/20 2255)    lactated ringers       LOS: 6 days   Time spent: 35 minutes.with >50 % on coc   Nolberto Hanlon, MD [facility]  If 7PM-7AM, please contact night-coverage [email or web address]  10/10/2020, 5:43 PM

## 2020-10-10 NOTE — TOC Progression Note (Signed)
Transition of Care [facility]) - Progression Note    Patient Details  Name: Jasmine Colon MRN: [id number] Date of Birth: 05/18/1940  Transition of Care [facility]) CM/SW [place], RN Phone Number: 10/10/2020, 11:57 AM  Clinical Narrative:     Clement Sayres to request auth for EMS to go home, awaiting approval       Expected Discharge Plan and Services                                                 Social Determinants of Health (SDOH) Interventions    Readmission Risk Interventions No flowsheet data found.

## 2020-10-10 NOTE — Progress Notes (Signed)
Pt unsure of last BM, though she will report different days; per pt's daughter, pt has not had a BM since Sunday 6/19. Pt advised that there is no documented BM in chart and that it has been 4 days since last known BM.   Pt offered and refusing all prn medications at this time; MD notified.

## 2020-10-10 NOTE — Plan of Care (Signed)

## 2020-10-10 NOTE — Progress Notes (Signed)
  Subjective:  Patient reports pain as mild.    Objective:   VITALS:   Vitals:   10/09/20 1556 10/09/20 1950 10/09/20 2340 10/10/20 0434  BP: (!) 92/57 97/64 105/69 (!) 96/58  Pulse: 90 91 96 86  Resp: 16 16 16 16   Temp: 98 F (36.7 C) 97.9 F (36.6 C) 98.7 F (37.1 C) 98.4 F (36.9 C)  TempSrc: Oral Oral Oral Oral  SpO2: 98% 96% 97% 98%  Weight:      Height:        PHYSICAL EXAM:  Neurologically intact ABD soft Neurovascular intact Sensation intact distally Intact pulses distally Dorsiflexion/Plantar flexion intact Incision: dressing C/D/I and scant drainage No cellulitis present Compartment soft  LABS  No results found for this or any previous visit (from the past 24 hour(s)).  No results found.  Assessment/Plan: 3 Days Post-Op   Principal Problem:   Hip dislocation, left (HCC) Active Problems:   Left hip pain   Parkinson's disease ([place])   Malnutrition of moderate degree   Advance diet Patient has bedbound, Non Weight Bearing which requires upper and lower body to be positioned in ways not feasible with a normal bed. Head must be elevated at least 30 degrees or Hip may become dislocated again.  Hip dislocation requires frequent changes in body position which cannot be achieved with a normal bed.   Patient wants to be discharge home with home health aid  Continue ASA 81 mg BID X 30 days Continue hydrocodone for pain Follow up in 4 weeks in office x-ray Staples can be removed in 2 weeks by home aid as we would like to avoid transfer to office until 4 weeks Call office for appointment [phone] with Dr. Kenton Kingfisher PA-C   10/10/2020, 6:24 AM

## 2020-10-11 NOTE — Care Management Important Message (Signed)
Important Message  Patient Details  Name: Ruta Capece MRN: [id number] Date of Birth: 03/08/1941   Medicare Important Message Given:  Yes     Dannette Barbara 10/11/2020, 10:46 AM

## 2020-10-11 NOTE — TOC Progression Note (Signed)
Transition of Care [facility]) - Progression Note    Patient Details  Name: Jasmine Colon MRN: [id number] Date of Birth: 1940/05/31  Transition of Care [facility]) CM/SW [place], RN Phone Number: 10/11/2020, 9:14 AM  Clinical Narrative:    The patient's hospital bed was delivered last night around 7 PM, [facility] was called to transport prior approval number [id number], they will pick her up at 2 PM, Daughter is aware        Expected Discharge Plan and Services           Expected Discharge Date: 10/11/20                                     Social Determinants of Health (SDOH) Interventions    Readmission Risk Interventions No flowsheet data found.

## 2020-10-11 NOTE — Discharge Summary (Signed)
Jasmine Colon ERD:[id number] DOB: 06/15/1940 DOA: 10/03/2020  PCP: Dion Body, MD  Admit date: 10/03/2020 Discharge date: 10/11/2020  Admitted From: SNF Disposition:  home  Recommendations for Outpatient Follow-up:  Follow up with PCP in 1 week Please obtain BMP/CBC in one week Please follow up orthopedics Dr. Harlow Mares in 1 week  Home Health: Yes   Discharge Condition:Stable CODE STATUS: Full Diet recommendation: Heart Healthy  Brief/Interim Summary: Per HPI:Jasmine Colon is a 80 y.o. female with medical history significant for Parkinson's disease, status post left hip hemiarthroplasty on 09/21/20 following a fall complicated by periprosthetic dislocation requiring closed reduction in the OR on 09/27/20.   Patient presents to the ER via EMS with complaints of left hip pain that occurred suddenly.  She describes it as a severe pain and rated an 8 x 10 in intensity at its worst.Left hip x-ray shows superior dislocation of the left hip arthroplasty.  Orthopedics was consulted.  Of note Foley catheter was placed by orthopedics to prevent excessive movement and they were requesting to keep it in after surgery.  Left Hip Dislocation.  Multiple dislocations of hemiarthroplasty.  S/p total hip replacement  by orthopedic surgery. -Non weightbearing on the left side and use reduction pillow at all times. Head must be elevated at least 30 degrees or Hip may become dislocated again.   Continue ASA 81 mg BID X 30 days Continue hydrocodone for pain Follow up in 4 weeks in office x-ray Staples can be removed in 2 weeks by home aid as we would like to avoid transfer to office until 4 weeks Call office for appointment [phone] with Dr. Chrystine Oiler hematuria.  Resolved. Secondary to trauma with accidental pulling of Foley catheter by patient  Will need cbc done as outpt, may need to be drawn at house   Hypotension-  Appears to be asx Placed on midodrine     Parkinson's  disease/cognitive impairment. -Continue Sinemet and rasagiline        Malnutrition of moderate degree (BMI 18) Continue nutritional supplements TID Maintain regular diet    Depression Continue home meds  Discharge Diagnoses:  Principal Problem:   Hip dislocation, left ([name]) Active Problems:   Left hip pain   Parkinson's disease ([name])   Malnutrition of moderate degree    Discharge Instructions  Discharge Instructions     Call MD for:  severe uncontrolled pain   Complete by: As directed    Call MD for:  temperature >100.4   Complete by: As directed    Diet - low sodium heart healthy   Complete by: As directed    Discharge instructions   Complete by: As directed    Continue ASA 81 mg BID X 30 days Follow up in 4 weeks in office x-ray Staples can be removed in 2 weeks by home aid as we would like to avoid transfer to office until 4 weeks Call office for appointment [phone] with Dr. Harlow Mares  Non Weight Bearing . Head must be elevated at least 30 degrees or Hip may become dislocated again.   Increase activity slowly   Complete by: As directed    No wound care   Complete by: As directed    AS ABOVE WITH STAPLES BEING REMOVED      Allergies as of 10/11/2020       Reactions   Codeine    Feels weird   Pseudoephedrine    Other reaction(s): Dizziness  and giddiness (finding)        Medication List     STOP taking these medications    aspirin EC 325 MG tablet Replaced by: aspirin 81 MG chewable tablet   aspirin EC 81 MG tablet   HYDROcodone-acetaminophen 5-325 MG tablet Commonly known as: NORCO/VICODIN   ibuprofen 200 MG tablet Commonly known as: ADVIL       TAKE these medications    acetaminophen 650 MG CR tablet Commonly known as: TYLENOL Take 650 mg by mouth every 8 (eight) hours as needed for pain.   aspirin 81 MG chewable tablet Chew 1 tablet (81 mg total) by mouth 2 (two) times daily. Replaces: aspirin EC 325 MG tablet   CALTRATE  600+D PO Take 1 tablet by mouth daily.   carbidopa-levodopa 25-100 MG tablet Commonly known as: SINEMET IR Take 1 tablet by mouth 6 (six) times daily.   cyanocobalamin 100 MCG tablet Take 100 mcg by mouth daily. What changed: Another medication with the same name was removed. Continue taking this medication, and follow the directions you see here.   docusate sodium 100 MG capsule Commonly known as: COLACE Take 1 capsule (100 mg total) by mouth 2 (two) times daily as needed for up to 7 days for mild constipation or moderate constipation.   feeding supplement Liqd Take 237 mLs by mouth 3 (three) times daily between meals.   ferrous sulfate 325 (65 FE) MG tablet Take 1 tablet (325 mg total) by mouth daily with breakfast.   fluticasone 50 MCG/ACT nasal spray Commonly known as: FLONASE Place 2 sprays into both nostrils daily as needed for allergies or rhinitis.   loratadine 10 MG tablet Commonly known as: CLARITIN Take 10 mg by mouth daily as needed for allergies.   methocarbamol 500 MG tablet Commonly known as: ROBAXIN Take 1 tablet (500 mg total) by mouth 4 (four) times daily for 7 days.   midodrine 10 MG tablet Commonly known as: PROAMATINE Take 1 tablet (10 mg total) by mouth 3 (three) times daily with meals for 14 days.   mirtazapine 30 MG tablet Commonly known as: REMERON Take 30 mg by mouth at bedtime.   oxyCODONE 5 MG immediate release tablet Commonly known as: Oxy IR/ROXICODONE Take 1 tablet (5 mg total) by mouth every 4 (four) hours as needed for up to 3 days for moderate pain. What changed: reasons to take this   PRESERVISION AREDS PO Take 1 capsule by mouth daily.   rasagiline 1 MG Tabs tablet Commonly known as: AZILECT Take 1 tablet (1 mg total) by mouth daily.   sodium chloride 0.65 % Soln nasal spray Commonly known as: OCEAN Place 1 spray into both nostrils daily as needed (allergies).               Durable Medical Equipment  (From  admission, onward)           Start     Ordered   10/09/20 1630  For home use only DME Hospital bed  Once       Comments: With APP  Question Answer Comment  Length of Need 6 Months   Patient has (list medical condition): Parkinsons, Non weight bearing dislocated Hip   The above medical condition requires: Patient requires the ability to reposition frequently   Head must be elevated greater than: 30 degrees   Bed type Semi-electric   Trapeze Bar Yes      10/09/20 1630  Contact information for follow-up providers     Lovell Sheehan, MD. Call.   Specialty: Orthopedic Surgery Why: Clinic will call patient to schedule follow up appointment. Contact information: [place] [address] [phone]         Dion Body, MD Follow up in 2 week(s).   Specialty: Family Medicine Why: needs cbc next week, but pt cannot ambulate as bed bound Contact information: [place] [address] [phone]              Contact information for after-discharge care     Destination     [facility] .   Service: Skilled Nursing Contact information: [place] [address] [phone]                    Allergies  Allergen Reactions   Codeine     Feels weird   Pseudoephedrine     Other reaction(s): Dizziness and giddiness (finding)    Consultations: Orthopedics   Procedures/Studies: DG Chest 1 View  Result Date: 10/03/2020 CLINICAL DATA:  Left hip pain EXAM: CHEST  1 VIEW COMPARISON:  09/30/2020 FINDINGS: The lungs are hyperinflated with diffuse interstitial prominence. No focal airspace consolidation or pulmonary edema. No pleural effusion or pneumothorax. Normal cardiomediastinal contours. IMPRESSION: Hyperinflation without acute airspace disease. Electronically Signed   By: Ulyses Jarred M.D.   On: 10/03/2020 20:40   DG Chest 1  View  Result Date: 09/30/2020 CLINICAL DATA:  Hip dislocation EXAM: CHEST  1 VIEW COMPARISON:  September 27, 2020 FINDINGS: Small area of airspace opacity lateral to the inferior right hilum. Lungs elsewhere clear. Heart size and pulmonary vascular normal. There is aortic atherosclerosis. There is scoliosis. There is arthropathy in the right shoulder. IMPRESSION: Suspect small focus of pneumonia right lower lobe lateral to the inferior right hilum. Lungs otherwise clear. Heart size normal. Aortic Atherosclerosis (ICD10-I70.0). Electronically Signed   By: Lowella Grip III M.D.   On: 09/30/2020 12:07   DG Chest 1 View  Result Date: 09/21/2020 CLINICAL DATA:  Fall, left chest pain EXAM: CHEST  1 VIEW COMPARISON:  None. FINDINGS: Lungs are well expanded, symmetric, and clear. No pneumothorax or pleural effusion. Cardiac size within normal limits. Pulmonary vascularity is normal. Osseous structures are age-appropriate. Mild thoracic sigmoid scoliosis noted. No acute bone abnormality. IMPRESSION: No active disease. Electronically Signed   By: Fidela Salisbury MD   On: 09/21/2020 01:13   DG Pelvis 1-2 Views  Result Date: 09/30/2020 CLINICAL DATA:  Status post reduction EXAM: PELVIS - 1 VIEW COMPARISON:  Films from earlier in the same day. FINDINGS: Left femoral prosthesis has been reduced into the acetabulum. No fracture is identified. Stable degenerative change in the lumbar spine is noted. IMPRESSION: Status post reduction with appropriate positioning of the left hemiarthroplasty. Electronically Signed   By: Inez Catalina M.D.   On: 09/30/2020 13:55   DG Pelvis Portable  Result Date: 10/07/2020 CLINICAL DATA:  History of left total hip replacement. EXAM: PORTABLE PELVIS 1-2 VIEWS COMPARISON:  Radiograph earlier today, 10/04/2020. FINDINGS: Revision left hip arthroplasty in expected alignment. There is a cerclage wire about the proximal femoral stem. No periprosthetic fracture. Recent postsurgical change  includes air and edema in the soft tissues lateral skin staples. IMPRESSION: Revision left hip arthroplasty without immediate postoperative complication. Electronically Signed   By: Keith Rake M.D.   On: 10/07/2020 19:37   DG Chest  Port 1 View  Result Date: 09/27/2020 CLINICAL DATA:  Fall, left hip dislocation EXAM: PORTABLE CHEST 1 VIEW COMPARISON:  09/21/2020 FINDINGS: Lungs are clear. No pneumothorax or pleural effusion. The thoracic aorta is tortuous appears mildly ectatic likely as result of patient positioning. Cardiac size within normal limits. Sigmoid scoliosis of the thoracolumbar spine noted. No acute bone abnormality. IMPRESSION: No active disease. Electronically Signed   By: Fidela Salisbury MD   On: 09/27/2020 02:54   DG HIP PORT UNILAT WITH PELVIS 1V LEFT  Result Date: 10/07/2020 CLINICAL DATA:  Status post left hip replacement/revision EXAM: DG HIP (WITH OR WITHOUT PELVIS) 1V PORT LEFT COMPARISON:  10/04/2020 FINDINGS: New left hip prosthesis is noted with acetabular component and fixation screws. No acute fracture is seen. No soft tissue abnormality is noted. IMPRESSION: Status post left hip revision Electronically Signed   By: Inez Catalina M.D.   On: 10/07/2020 18:39   DG Hip Port Unilat With Pelvis 1V Left  Result Date: 09/21/2020 CLINICAL DATA:  Postop LEFT hip surgery EXAM: DG HIP (WITH OR WITHOUT PELVIS) 1V PORT LEFT COMPARISON:  None. FINDINGS: Unipolar LEFT hip arthroplasty. Expected soft tissue changes. No fracture or dislocation. IMPRESSION: No complication following LEFT hip arthroplasty. Electronically Signed   By: Suzy Bouchard M.D.   On: 09/21/2020 14:00   DG HIP OPERATIVE UNILAT W OR W/O PELVIS LEFT  Result Date: 09/27/2020 CLINICAL DATA:  Left hip arthroplasty. EXAM: OPERATIVE LEFT  HIP (WITH PELVIS IF PERFORMED) 1 VIEWS TECHNIQUE: Fluoroscopic spot image(s) were submitted for interpretation post-operatively. COMPARISON:  Plain film of earlier in the day.  FINDINGS: 2 intraoperative images demonstrate interval relocation (in 1 plane) of the left hip arthroplasty. No periprosthetic fracture identified. IMPRESSION: Interval relocation of left hip arthroplasty. Electronically Signed   By: Abigail Miyamoto M.D.   On: 09/27/2020 17:54   DG HIP UNILAT WITH PELVIS 2-3 VIEWS LEFT  Result Date: 10/04/2020 CLINICAL DATA:  Hip pain EXAM: DG HIP (WITH OR WITHOUT PELVIS) 2-3V LEFT COMPARISON:  10/03/2020 FINDINGS: Right femoral head projects in joint. Pubic symphysis and rami are intact. Left hip hemiarthroplasty with cranial dislocation of the left femoral component. IMPRESSION: Left hip hemiarthroplasty with cranial dislocation of the left femoral component Electronically Signed   By: Donavan Foil M.D.   On: 10/04/2020 17:42   DG Hip Unilat W or Wo Pelvis 2-3 Views Left  Result Date: 10/03/2020 CLINICAL DATA:  Postreduction EXAM: DG HIP (WITH OR WITHOUT PELVIS) 2-3V LEFT COMPARISON:  10/03/2020 FINDINGS: Interval reduction of the previously seen dislocated left hip replacement. Normal alignment. No fracture. IMPRESSION: Interval reduction.  No fracture Electronically Signed   By: Rolm Baptise M.D.   On: 10/03/2020 22:17   DG Hip Unilat W or Wo Pelvis 2-3 Views Left  Result Date: 10/03/2020 CLINICAL DATA:  80 year old female with left hip pain. EXAM: DG HIP (WITH OR WITHOUT PELVIS) 2-3V LEFT COMPARISON:  Left hip radiograph dated 09/30/2020. FINDINGS: There is a left hip arthroplasty. There is superior dislocation of the arthroplasty of side of the and abdomen. No definite acute fracture identified. The bones are osteopenic. The soft tissues are grossly unremarkable. Cutaneous clips noted over the left hip. IMPRESSION: Superior dislocation of the left hip arthroplasty. Electronically Signed   By: Anner Crete M.D.   On: 10/03/2020 20:38   DG Hip Unilat W or Wo Pelvis 2-3 Views Left  Result Date: 09/30/2020 CLINICAL DATA:  Pain EXAM: DG HIP (WITH OR WITHOUT  PELVIS) 2-3V LEFT  COMPARISON:  September 21, 2020 FINDINGS: Frontal pelvis as well as frontal and lateral left hip images were obtained. There is a total hip replacement on the left. There is dislocation at the left hip joint with the left femur located lateral and posterior to the acetabulum. No fracture evident. There is degenerative change in the lower lumbar spine with levoscoliosis. Sacroiliac joints appear normal bilaterally. IMPRESSION: Total hip replacement on the left with dislocation. The left femur is displaced superiorly and posteriorly with respect to the acetabulum. No fracture. Degenerative change and scoliosis in lumbar spine. Electronically Signed   By: Lowella Grip III M.D.   On: 09/30/2020 12:06   DG Hip Unilat With Pelvis 2-3 Views Left  Result Date: 09/27/2020 CLINICAL DATA:  Fall, left hip deformity EXAM: DG HIP (WITH OR WITHOUT PELVIS) 2-3V LEFT COMPARISON:  None. FINDINGS: Left hip bipolar hemiarthroplasty has been performed. Since prior examination, there is now superior dislocation in marked internal rotation of the left hip. Small ossific fragments adjacent to the left acetabulum may represent the residua of recent arthroplasty procedure. No displaced fracture identified. The pelvis and visualized right hip are unremarkable. IMPRESSION: Dislocation of the left hip bipolar hemiarthroplasty. Electronically Signed   By: Fidela Salisbury MD   On: 09/27/2020 02:52   DG Hip Unilat W or Wo Pelvis 2-3 Views Left  Result Date: 09/21/2020 CLINICAL DATA:  Pt states she had fall around 7pm tonight and fell on her left side and is having pain to left upper leg. Pt has hx of parkinsons disease and has noted involuntary movement. EXAM: LEFT FEMUR PORTABLE 2 VIEWS; DG HIP (WITH OR WITHOUT PELVIS) 2-3V LEFT COMPARISON:  None. FINDINGS: Otherwise no acute displaced fracture of the bones of the pelvis. No pelvic diastasis. Poorly visualized fractured and displaced left femoral neck. The distal left femur  demonstrates no acute displaced fracture. Visualized portions of the left knee demonstrate tricompartmental degenerative changes in likely a small joint effusion. Frontal view of the visualized right hip grossly unremarkable. Visualized lumbar spine demonstrates degenerative changes. IMPRESSION: 1. Poorly visualized fractured and displaced left femoral neck. Recommend CT noncontrast for further evaluation. 2. No acute displaced fracture of the distal left femur. 3. No acute displaced fracture or diastasis of the bones of the pelvis. 4. Partially visualized frontal view of the right hip grossly unremarkable. 5. Tricompartmental degenerative changes of the left knee. Electronically Signed   By: Iven Finn M.D.   On: 09/21/2020 01:12   DG FEMUR PORT MIN 2 VIEWS LEFT  Result Date: 09/21/2020 CLINICAL DATA:  Pt states she had fall around 7pm tonight and fell on her left side and is having pain to left upper leg. Pt has hx of parkinsons disease and has noted involuntary movement. EXAM: LEFT FEMUR PORTABLE 2 VIEWS; DG HIP (WITH OR WITHOUT PELVIS) 2-3V LEFT COMPARISON:  None. FINDINGS: Otherwise no acute displaced fracture of the bones of the pelvis. No pelvic diastasis. Poorly visualized fractured and displaced left femoral neck. The distal left femur demonstrates no acute displaced fracture. Visualized portions of the left knee demonstrate tricompartmental degenerative changes in likely a small joint effusion. Frontal view of the visualized right hip grossly unremarkable. Visualized lumbar spine demonstrates degenerative changes. IMPRESSION: 1. Poorly visualized fractured and displaced left femoral neck. Recommend CT noncontrast for further evaluation. 2. No acute displaced fracture of the distal left femur. 3. No acute displaced fracture or diastasis of the bones of the pelvis. 4. Partially visualized frontal view of the right  hip grossly unremarkable. 5. Tricompartmental degenerative changes of the left knee.  Electronically Signed   By: Iven Finn M.D.   On: 09/21/2020 01:12      Subjective: Has no complaints.  Denies shortness of breath, chest pain, or any other complaint.  She is ready to go home  Discharge Exam: Vitals:   10/11/20 0510 10/11/20 0808  BP: 114/71 124/73  Pulse: 92 89  Resp: 16 (!) 23  Temp: 98.4 F (36.9 C) 97.8 F (36.6 C)  SpO2: 98% 98%   Vitals:   10/10/20 1551 10/10/20 2050 10/11/20 0510 10/11/20 0808  BP: 102/65 116/66 114/71 124/73  Pulse: 96 98 92 89  Resp: 16 18 16  (!) 23  Temp: 98.4 F (36.9 C) 98.5 F (36.9 C) 98.4 F (36.9 C) 97.8 F (36.6 C)  TempSrc:  Oral Oral   SpO2: 95% 93% 98% 98%  Weight:      Height:        General: Pt is alert, awake, not in acute distress Cardiovascular: RRR, S1/S2 +, no rubs, no gallops Respiratory: CTA bilaterally, no wheezing, no rhonchi Abdominal: Soft, NT, ND, bowel sounds + Extremities: no edema    The results of significant diagnostics from this hospitalization (including imaging, microbiology, ancillary and laboratory) are listed below for reference.     Microbiology: Recent Results (from the past 240 hour(s))  SARS CORONAVIRUS 2 (TAT 6-24 HRS) Nasopharyngeal Nasopharyngeal Swab     Status: None   Collection Time: 10/03/20 11:50 PM   Specimen: Nasopharyngeal Swab  Result Value Ref Range Status   SARS Coronavirus 2 NEGATIVE NEGATIVE Final    Comment: (NOTE) SARS-CoV-2 target nucleic acids are NOT DETECTED.  The SARS-CoV-2 RNA is generally detectable in upper and lower respiratory specimens during the acute phase of infection. Negative results do not preclude SARS-CoV-2 infection, do not rule out co-infections with other pathogens, and should not be used as the sole basis for treatment or other patient management decisions. Negative results must be combined with clinical observations, patient history, and epidemiological information. The expected result is Negative.  Fact Sheet for  Patients: SugarRoll.be  Fact Sheet for Healthcare Providers: https://www.woods-mathews.com/  This test is not yet approved or cleared by the [place] FDA and  has been authorized for detection and/or diagnosis of SARS-CoV-2 by FDA under an Emergency Use Authorization (EUA). This EUA will remain  in effect (meaning this test can be used) for the duration of the COVID-19 declaration under Se ction 564(b)(1) of the Act, 21 U.S.C. section 360bbb-3(b)(1), unless the authorization is terminated or revoked sooner.  Performed at [facility], [place] [address], [place],  [address]      Labs: BNP (last 3 results) No results for input(s): BNP in the last 8760 hours. Basic Metabolic Panel: Recent Labs  Lab 10/07/20 0429 10/08/20 0544  NA 137 135  K 4.2 4.4  CL 101 104  CO2 29 25  GLUCOSE 96 110*  BUN 16 18  CREATININE 0.73 0.78  CALCIUM 8.2* 7.8*   Liver Function Tests: No results for input(s): AST, ALT, ALKPHOS, BILITOT, PROT, ALBUMIN in the last 168 hours. No results for input(s): LIPASE, AMYLASE in the last 168 hours. No results for input(s): AMMONIA in the last 168 hours. CBC: Recent Labs  Lab 10/07/20 0429 10/08/20 0544 10/09/20 0436  WBC 11.3* 12.4* 11.2*  HGB 10.5* 8.6* 8.0*  HCT 32.5* 27.2* 24.1*  MCV 100.6* 103.0* 96.4  PLT 650* 489* 460*   Cardiac  Enzymes: No results for input(s): CKTOTAL, CKMB, CKMBINDEX, TROPONINI in the last 168 hours. BNP: Invalid input(s): POCBNP CBG: No results for input(s): GLUCAP in the last 168 hours. D-Dimer No results for input(s): DDIMER in the last 72 hours. Hgb A1c No results for input(s): HGBA1C in the last 72 hours. Lipid Profile No results for input(s): CHOL, HDL, LDLCALC, TRIG, CHOLHDL, LDLDIRECT in the last 72 hours. Thyroid function studies No results for input(s): TSH, T4TOTAL, T3FREE, THYROIDAB in the last 72 hours.  Invalid input(s): FREET3 Anemia work  up No results for input(s): VITAMINB12, FOLATE, FERRITIN, TIBC, IRON, RETICCTPCT in the last 72 hours. Urinalysis    Component Value Date/Time   COLORURINE YELLOW (A) 09/21/2020 0153   APPEARANCEUR CLEAR (A) 09/21/2020 0153   LABSPEC 1.021 09/21/2020 0153   PHURINE 5.0 09/21/2020 0153   GLUCOSEU NEGATIVE 09/21/2020 0153   HGBUR NEGATIVE 09/21/2020 0153   BILIRUBINUR NEGATIVE 09/21/2020 0153   KETONESUR 5 (A) 09/21/2020 0153   PROTEINUR NEGATIVE 09/21/2020 0153   NITRITE NEGATIVE 09/21/2020 0153   LEUKOCYTESUR SMALL (A) 09/21/2020 0153   Sepsis Labs Invalid input(s): PROCALCITONIN,  WBC,  LACTICIDVEN Microbiology Recent Results (from the past 240 hour(s))  SARS CORONAVIRUS 2 (TAT 6-24 HRS) Nasopharyngeal Nasopharyngeal Swab     Status: None   Collection Time: 10/03/20 11:50 PM   Specimen: Nasopharyngeal Swab  Result Value Ref Range Status   SARS Coronavirus 2 NEGATIVE NEGATIVE Final    Comment: (NOTE) SARS-CoV-2 target nucleic acids are NOT DETECTED.  The SARS-CoV-2 RNA is generally detectable in upper and lower respiratory specimens during the acute phase of infection. Negative results do not preclude SARS-CoV-2 infection, do not rule out co-infections with other pathogens, and should not be used as the sole basis for treatment or other patient management decisions. Negative results must be combined with clinical observations, patient history, and epidemiological information. The expected result is Negative.  Fact Sheet for Patients: SugarRoll.be  Fact Sheet for Healthcare Providers: https://www.woods-mathews.com/  This test is not yet approved or cleared by the [place] FDA and  has been authorized for detection and/or diagnosis of SARS-CoV-2 by FDA under an Emergency Use Authorization (EUA). This EUA will remain  in effect (meaning this test can be used) for the duration of the COVID-19 declaration under Se ction 564(b)(1)  of the Act, 21 U.S.C. section 360bbb-3(b)(1), unless the authorization is terminated or revoked sooner.  Performed at [facility], [place] [address] [place], [place] [address]      Time coordinating discharge: Over 30 minutes  SIGNED:   Nolberto Hanlon, MD  [facility] 10/11/2020, 3:00 PM Pager   If 7PM-7AM, please contact night-coverage [email or web address] Password [email or web address]

## 2020-10-11 NOTE — TOC Progression Note (Signed)
Transition of Care [facility]) - Progression Note    Patient Details  Name: Jasmine Colon MRN: [id number] Date of Birth: [birth year]  Transition of Care [facility]) CM/SW [facility], RN Phone Number: 10/11/2020, 9:33 AM  Clinical Narrative:     [facility] has accepted the patient and will do PT eval, Ot and nurse aide, start early next week, Daughter aware       Expected Discharge Plan and Services           Expected Discharge Date: 10/11/20                                     Social Determinants of Health (SDOH) Interventions    Readmission Risk Interventions No flowsheet data found.

## 2020-10-12 DIAGNOSIS — Z79891 Long term (current) use of opiate analgesic: Secondary | ICD-10-CM | POA: Diagnosis not present

## 2020-10-12 DIAGNOSIS — G20 Parkinson's disease: Secondary | ICD-10-CM | POA: Diagnosis not present

## 2020-10-12 DIAGNOSIS — E440 Moderate protein-calorie malnutrition: Secondary | ICD-10-CM | POA: Diagnosis not present

## 2020-10-12 DIAGNOSIS — M47816 Spondylosis without myelopathy or radiculopathy, lumbar region: Secondary | ICD-10-CM | POA: Diagnosis not present

## 2020-10-12 DIAGNOSIS — Z791 Long term (current) use of non-steroidal anti-inflammatories (NSAID): Secondary | ICD-10-CM | POA: Diagnosis not present

## 2020-10-12 DIAGNOSIS — T84021A Dislocation of internal left hip prosthesis, initial encounter: Secondary | ICD-10-CM | POA: Diagnosis not present

## 2020-10-12 DIAGNOSIS — J45909 Unspecified asthma, uncomplicated: Secondary | ICD-10-CM | POA: Diagnosis not present

## 2020-10-12 DIAGNOSIS — M1990 Unspecified osteoarthritis, unspecified site: Secondary | ICD-10-CM | POA: Diagnosis not present

## 2020-10-12 DIAGNOSIS — I349 Nonrheumatic mitral valve disorder, unspecified: Secondary | ICD-10-CM | POA: Diagnosis not present

## 2020-10-12 DIAGNOSIS — S72012D Unspecified intracapsular fracture of left femur, subsequent encounter for closed fracture with routine healing: Secondary | ICD-10-CM | POA: Diagnosis not present

## 2020-10-12 DIAGNOSIS — M4186 Other forms of scoliosis, lumbar region: Secondary | ICD-10-CM | POA: Diagnosis not present

## 2020-10-12 DIAGNOSIS — D649 Anemia, unspecified: Secondary | ICD-10-CM | POA: Diagnosis not present

## 2020-10-12 DIAGNOSIS — Z7982 Long term (current) use of aspirin: Secondary | ICD-10-CM | POA: Diagnosis not present

## 2020-10-12 DIAGNOSIS — M810 Age-related osteoporosis without current pathological fracture: Secondary | ICD-10-CM | POA: Diagnosis not present

## 2020-10-12 DIAGNOSIS — E785 Hyperlipidemia, unspecified: Secondary | ICD-10-CM | POA: Diagnosis not present

## 2020-10-12 DIAGNOSIS — G5730 Lesion of lateral popliteal nerve, unspecified lower limb: Secondary | ICD-10-CM | POA: Diagnosis not present

## 2020-10-12 DIAGNOSIS — Z9181 History of falling: Secondary | ICD-10-CM | POA: Diagnosis not present

## 2020-10-12 DIAGNOSIS — Z7401 Bed confinement status: Secondary | ICD-10-CM | POA: Diagnosis not present

## 2020-10-12 DIAGNOSIS — Z853 Personal history of malignant neoplasm of breast: Secondary | ICD-10-CM | POA: Diagnosis not present

## 2020-10-15 DIAGNOSIS — D649 Anemia, unspecified: Secondary | ICD-10-CM | POA: Diagnosis not present

## 2020-10-15 DIAGNOSIS — E440 Moderate protein-calorie malnutrition: Secondary | ICD-10-CM | POA: Diagnosis not present

## 2020-10-15 DIAGNOSIS — S72012D Unspecified intracapsular fracture of left femur, subsequent encounter for closed fracture with routine healing: Secondary | ICD-10-CM | POA: Diagnosis not present

## 2020-10-15 DIAGNOSIS — J45909 Unspecified asthma, uncomplicated: Secondary | ICD-10-CM | POA: Diagnosis not present

## 2020-10-15 DIAGNOSIS — I349 Nonrheumatic mitral valve disorder, unspecified: Secondary | ICD-10-CM | POA: Diagnosis not present

## 2020-10-15 DIAGNOSIS — Z79891 Long term (current) use of opiate analgesic: Secondary | ICD-10-CM | POA: Diagnosis not present

## 2020-10-15 DIAGNOSIS — Z7982 Long term (current) use of aspirin: Secondary | ICD-10-CM | POA: Diagnosis not present

## 2020-10-15 DIAGNOSIS — M4186 Other forms of scoliosis, lumbar region: Secondary | ICD-10-CM | POA: Diagnosis not present

## 2020-10-15 DIAGNOSIS — Z853 Personal history of malignant neoplasm of breast: Secondary | ICD-10-CM | POA: Diagnosis not present

## 2020-10-15 DIAGNOSIS — E785 Hyperlipidemia, unspecified: Secondary | ICD-10-CM | POA: Diagnosis not present

## 2020-10-15 DIAGNOSIS — M1990 Unspecified osteoarthritis, unspecified site: Secondary | ICD-10-CM | POA: Diagnosis not present

## 2020-10-15 DIAGNOSIS — M47816 Spondylosis without myelopathy or radiculopathy, lumbar region: Secondary | ICD-10-CM | POA: Diagnosis not present

## 2020-10-15 DIAGNOSIS — Z9181 History of falling: Secondary | ICD-10-CM | POA: Diagnosis not present

## 2020-10-15 DIAGNOSIS — Z7401 Bed confinement status: Secondary | ICD-10-CM | POA: Diagnosis not present

## 2020-10-15 DIAGNOSIS — G20 Parkinson's disease: Secondary | ICD-10-CM | POA: Diagnosis not present

## 2020-10-15 DIAGNOSIS — G5730 Lesion of lateral popliteal nerve, unspecified lower limb: Secondary | ICD-10-CM | POA: Diagnosis not present

## 2020-10-15 DIAGNOSIS — M810 Age-related osteoporosis without current pathological fracture: Secondary | ICD-10-CM | POA: Diagnosis not present

## 2020-10-15 DIAGNOSIS — T84021A Dislocation of internal left hip prosthesis, initial encounter: Secondary | ICD-10-CM | POA: Diagnosis not present

## 2020-10-15 DIAGNOSIS — Z791 Long term (current) use of non-steroidal anti-inflammatories (NSAID): Secondary | ICD-10-CM | POA: Diagnosis not present

## 2020-10-18 DIAGNOSIS — I349 Nonrheumatic mitral valve disorder, unspecified: Secondary | ICD-10-CM | POA: Diagnosis not present

## 2020-10-18 DIAGNOSIS — Z853 Personal history of malignant neoplasm of breast: Secondary | ICD-10-CM | POA: Diagnosis not present

## 2020-10-18 DIAGNOSIS — T84021A Dislocation of internal left hip prosthesis, initial encounter: Secondary | ICD-10-CM | POA: Diagnosis not present

## 2020-10-18 DIAGNOSIS — M1990 Unspecified osteoarthritis, unspecified site: Secondary | ICD-10-CM | POA: Diagnosis not present

## 2020-10-18 DIAGNOSIS — G20 Parkinson's disease: Secondary | ICD-10-CM | POA: Diagnosis not present

## 2020-10-18 DIAGNOSIS — Z9181 History of falling: Secondary | ICD-10-CM | POA: Diagnosis not present

## 2020-10-18 DIAGNOSIS — Z791 Long term (current) use of non-steroidal anti-inflammatories (NSAID): Secondary | ICD-10-CM | POA: Diagnosis not present

## 2020-10-18 DIAGNOSIS — M810 Age-related osteoporosis without current pathological fracture: Secondary | ICD-10-CM | POA: Diagnosis not present

## 2020-10-18 DIAGNOSIS — Z79891 Long term (current) use of opiate analgesic: Secondary | ICD-10-CM | POA: Diagnosis not present

## 2020-10-18 DIAGNOSIS — D649 Anemia, unspecified: Secondary | ICD-10-CM | POA: Diagnosis not present

## 2020-10-18 DIAGNOSIS — M4186 Other forms of scoliosis, lumbar region: Secondary | ICD-10-CM | POA: Diagnosis not present

## 2020-10-18 DIAGNOSIS — E440 Moderate protein-calorie malnutrition: Secondary | ICD-10-CM | POA: Diagnosis not present

## 2020-10-18 DIAGNOSIS — Z7982 Long term (current) use of aspirin: Secondary | ICD-10-CM | POA: Diagnosis not present

## 2020-10-18 DIAGNOSIS — Z7401 Bed confinement status: Secondary | ICD-10-CM | POA: Diagnosis not present

## 2020-10-18 DIAGNOSIS — E785 Hyperlipidemia, unspecified: Secondary | ICD-10-CM | POA: Diagnosis not present

## 2020-10-18 DIAGNOSIS — M47816 Spondylosis without myelopathy or radiculopathy, lumbar region: Secondary | ICD-10-CM | POA: Diagnosis not present

## 2020-10-18 DIAGNOSIS — S72012D Unspecified intracapsular fracture of left femur, subsequent encounter for closed fracture with routine healing: Secondary | ICD-10-CM | POA: Diagnosis not present

## 2020-10-18 DIAGNOSIS — J45909 Unspecified asthma, uncomplicated: Secondary | ICD-10-CM | POA: Diagnosis not present

## 2020-10-18 DIAGNOSIS — G5730 Lesion of lateral popliteal nerve, unspecified lower limb: Secondary | ICD-10-CM | POA: Diagnosis not present

## 2020-10-23 DIAGNOSIS — Z853 Personal history of malignant neoplasm of breast: Secondary | ICD-10-CM | POA: Diagnosis not present

## 2020-10-23 DIAGNOSIS — G5730 Lesion of lateral popliteal nerve, unspecified lower limb: Secondary | ICD-10-CM | POA: Diagnosis not present

## 2020-10-23 DIAGNOSIS — J45909 Unspecified asthma, uncomplicated: Secondary | ICD-10-CM | POA: Diagnosis not present

## 2020-10-23 DIAGNOSIS — Z79891 Long term (current) use of opiate analgesic: Secondary | ICD-10-CM | POA: Diagnosis not present

## 2020-10-23 DIAGNOSIS — M47816 Spondylosis without myelopathy or radiculopathy, lumbar region: Secondary | ICD-10-CM | POA: Diagnosis not present

## 2020-10-23 DIAGNOSIS — Z7401 Bed confinement status: Secondary | ICD-10-CM | POA: Diagnosis not present

## 2020-10-23 DIAGNOSIS — D649 Anemia, unspecified: Secondary | ICD-10-CM | POA: Diagnosis not present

## 2020-10-23 DIAGNOSIS — Z9181 History of falling: Secondary | ICD-10-CM | POA: Diagnosis not present

## 2020-10-23 DIAGNOSIS — G20 Parkinson's disease: Secondary | ICD-10-CM | POA: Diagnosis not present

## 2020-10-23 DIAGNOSIS — E785 Hyperlipidemia, unspecified: Secondary | ICD-10-CM | POA: Diagnosis not present

## 2020-10-23 DIAGNOSIS — Z791 Long term (current) use of non-steroidal anti-inflammatories (NSAID): Secondary | ICD-10-CM | POA: Diagnosis not present

## 2020-10-23 DIAGNOSIS — T84021A Dislocation of internal left hip prosthesis, initial encounter: Secondary | ICD-10-CM | POA: Diagnosis not present

## 2020-10-23 DIAGNOSIS — I349 Nonrheumatic mitral valve disorder, unspecified: Secondary | ICD-10-CM | POA: Diagnosis not present

## 2020-10-23 DIAGNOSIS — E440 Moderate protein-calorie malnutrition: Secondary | ICD-10-CM | POA: Diagnosis not present

## 2020-10-23 DIAGNOSIS — Z7982 Long term (current) use of aspirin: Secondary | ICD-10-CM | POA: Diagnosis not present

## 2020-10-23 DIAGNOSIS — S72012D Unspecified intracapsular fracture of left femur, subsequent encounter for closed fracture with routine healing: Secondary | ICD-10-CM | POA: Diagnosis not present

## 2020-10-23 DIAGNOSIS — M4186 Other forms of scoliosis, lumbar region: Secondary | ICD-10-CM | POA: Diagnosis not present

## 2020-10-23 DIAGNOSIS — M810 Age-related osteoporosis without current pathological fracture: Secondary | ICD-10-CM | POA: Diagnosis not present

## 2020-10-23 DIAGNOSIS — M1990 Unspecified osteoarthritis, unspecified site: Secondary | ICD-10-CM | POA: Diagnosis not present

## 2020-10-28 DIAGNOSIS — Z7982 Long term (current) use of aspirin: Secondary | ICD-10-CM | POA: Diagnosis not present

## 2020-10-28 DIAGNOSIS — G5730 Lesion of lateral popliteal nerve, unspecified lower limb: Secondary | ICD-10-CM | POA: Diagnosis not present

## 2020-10-28 DIAGNOSIS — M47816 Spondylosis without myelopathy or radiculopathy, lumbar region: Secondary | ICD-10-CM | POA: Diagnosis not present

## 2020-10-28 DIAGNOSIS — M4186 Other forms of scoliosis, lumbar region: Secondary | ICD-10-CM | POA: Diagnosis not present

## 2020-10-28 DIAGNOSIS — G20 Parkinson's disease: Secondary | ICD-10-CM | POA: Diagnosis not present

## 2020-10-28 DIAGNOSIS — Z853 Personal history of malignant neoplasm of breast: Secondary | ICD-10-CM | POA: Diagnosis not present

## 2020-10-28 DIAGNOSIS — I349 Nonrheumatic mitral valve disorder, unspecified: Secondary | ICD-10-CM | POA: Diagnosis not present

## 2020-10-28 DIAGNOSIS — T84021A Dislocation of internal left hip prosthesis, initial encounter: Secondary | ICD-10-CM | POA: Diagnosis not present

## 2020-10-28 DIAGNOSIS — D649 Anemia, unspecified: Secondary | ICD-10-CM | POA: Diagnosis not present

## 2020-10-28 DIAGNOSIS — Z791 Long term (current) use of non-steroidal anti-inflammatories (NSAID): Secondary | ICD-10-CM | POA: Diagnosis not present

## 2020-10-28 DIAGNOSIS — E785 Hyperlipidemia, unspecified: Secondary | ICD-10-CM | POA: Diagnosis not present

## 2020-10-28 DIAGNOSIS — Z9181 History of falling: Secondary | ICD-10-CM | POA: Diagnosis not present

## 2020-10-28 DIAGNOSIS — M1990 Unspecified osteoarthritis, unspecified site: Secondary | ICD-10-CM | POA: Diagnosis not present

## 2020-10-28 DIAGNOSIS — S72012D Unspecified intracapsular fracture of left femur, subsequent encounter for closed fracture with routine healing: Secondary | ICD-10-CM | POA: Diagnosis not present

## 2020-10-28 DIAGNOSIS — Z79891 Long term (current) use of opiate analgesic: Secondary | ICD-10-CM | POA: Diagnosis not present

## 2020-10-28 DIAGNOSIS — E440 Moderate protein-calorie malnutrition: Secondary | ICD-10-CM | POA: Diagnosis not present

## 2020-10-28 DIAGNOSIS — J45909 Unspecified asthma, uncomplicated: Secondary | ICD-10-CM | POA: Diagnosis not present

## 2020-10-28 DIAGNOSIS — Z7401 Bed confinement status: Secondary | ICD-10-CM | POA: Diagnosis not present

## 2020-10-28 DIAGNOSIS — M810 Age-related osteoporosis without current pathological fracture: Secondary | ICD-10-CM | POA: Diagnosis not present

## 2020-11-05 DIAGNOSIS — R404 Transient alteration of awareness: Secondary | ICD-10-CM | POA: Diagnosis not present

## 2020-11-05 DIAGNOSIS — Z96642 Presence of left artificial hip joint: Secondary | ICD-10-CM | POA: Diagnosis not present

## 2020-11-09 DIAGNOSIS — S73005A Unspecified dislocation of left hip, initial encounter: Secondary | ICD-10-CM | POA: Diagnosis not present

## 2020-11-09 DIAGNOSIS — G20 Parkinson's disease: Secondary | ICD-10-CM | POA: Diagnosis not present

## 2020-11-09 DIAGNOSIS — S73006A Unspecified dislocation of unspecified hip, initial encounter: Secondary | ICD-10-CM | POA: Diagnosis not present

## 2020-11-13 DIAGNOSIS — Z791 Long term (current) use of non-steroidal anti-inflammatories (NSAID): Secondary | ICD-10-CM | POA: Diagnosis not present

## 2020-11-13 DIAGNOSIS — Z79891 Long term (current) use of opiate analgesic: Secondary | ICD-10-CM | POA: Diagnosis not present

## 2020-11-13 DIAGNOSIS — Z9181 History of falling: Secondary | ICD-10-CM | POA: Diagnosis not present

## 2020-11-13 DIAGNOSIS — G20 Parkinson's disease: Secondary | ICD-10-CM | POA: Diagnosis not present

## 2020-11-13 DIAGNOSIS — J45909 Unspecified asthma, uncomplicated: Secondary | ICD-10-CM | POA: Diagnosis not present

## 2020-11-13 DIAGNOSIS — Z853 Personal history of malignant neoplasm of breast: Secondary | ICD-10-CM | POA: Diagnosis not present

## 2020-11-13 DIAGNOSIS — M1990 Unspecified osteoarthritis, unspecified site: Secondary | ICD-10-CM | POA: Diagnosis not present

## 2020-11-13 DIAGNOSIS — E785 Hyperlipidemia, unspecified: Secondary | ICD-10-CM | POA: Diagnosis not present

## 2020-11-13 DIAGNOSIS — G5730 Lesion of lateral popliteal nerve, unspecified lower limb: Secondary | ICD-10-CM | POA: Diagnosis not present

## 2020-11-13 DIAGNOSIS — T84021A Dislocation of internal left hip prosthesis, initial encounter: Secondary | ICD-10-CM | POA: Diagnosis not present

## 2020-11-13 DIAGNOSIS — I349 Nonrheumatic mitral valve disorder, unspecified: Secondary | ICD-10-CM | POA: Diagnosis not present

## 2020-11-13 DIAGNOSIS — Z7982 Long term (current) use of aspirin: Secondary | ICD-10-CM | POA: Diagnosis not present

## 2020-11-13 DIAGNOSIS — D649 Anemia, unspecified: Secondary | ICD-10-CM | POA: Diagnosis not present

## 2020-11-13 DIAGNOSIS — M4186 Other forms of scoliosis, lumbar region: Secondary | ICD-10-CM | POA: Diagnosis not present

## 2020-11-13 DIAGNOSIS — Z7401 Bed confinement status: Secondary | ICD-10-CM | POA: Diagnosis not present

## 2020-11-13 DIAGNOSIS — M47816 Spondylosis without myelopathy or radiculopathy, lumbar region: Secondary | ICD-10-CM | POA: Diagnosis not present

## 2020-11-13 DIAGNOSIS — E440 Moderate protein-calorie malnutrition: Secondary | ICD-10-CM | POA: Diagnosis not present

## 2020-11-13 DIAGNOSIS — S72012D Unspecified intracapsular fracture of left femur, subsequent encounter for closed fracture with routine healing: Secondary | ICD-10-CM | POA: Diagnosis not present

## 2020-11-13 DIAGNOSIS — M810 Age-related osteoporosis without current pathological fracture: Secondary | ICD-10-CM | POA: Diagnosis not present

## 2020-11-19 DIAGNOSIS — M47816 Spondylosis without myelopathy or radiculopathy, lumbar region: Secondary | ICD-10-CM | POA: Diagnosis not present

## 2020-11-19 DIAGNOSIS — Z853 Personal history of malignant neoplasm of breast: Secondary | ICD-10-CM | POA: Diagnosis not present

## 2020-11-19 DIAGNOSIS — D649 Anemia, unspecified: Secondary | ICD-10-CM | POA: Diagnosis not present

## 2020-11-19 DIAGNOSIS — Z791 Long term (current) use of non-steroidal anti-inflammatories (NSAID): Secondary | ICD-10-CM | POA: Diagnosis not present

## 2020-11-19 DIAGNOSIS — M1990 Unspecified osteoarthritis, unspecified site: Secondary | ICD-10-CM | POA: Diagnosis not present

## 2020-11-19 DIAGNOSIS — G5730 Lesion of lateral popliteal nerve, unspecified lower limb: Secondary | ICD-10-CM | POA: Diagnosis not present

## 2020-11-19 DIAGNOSIS — Z7982 Long term (current) use of aspirin: Secondary | ICD-10-CM | POA: Diagnosis not present

## 2020-11-19 DIAGNOSIS — Z79891 Long term (current) use of opiate analgesic: Secondary | ICD-10-CM | POA: Diagnosis not present

## 2020-11-19 DIAGNOSIS — T84021A Dislocation of internal left hip prosthesis, initial encounter: Secondary | ICD-10-CM | POA: Diagnosis not present

## 2020-11-19 DIAGNOSIS — Z7401 Bed confinement status: Secondary | ICD-10-CM | POA: Diagnosis not present

## 2020-11-19 DIAGNOSIS — J45909 Unspecified asthma, uncomplicated: Secondary | ICD-10-CM | POA: Diagnosis not present

## 2020-11-19 DIAGNOSIS — M4186 Other forms of scoliosis, lumbar region: Secondary | ICD-10-CM | POA: Diagnosis not present

## 2020-11-19 DIAGNOSIS — E785 Hyperlipidemia, unspecified: Secondary | ICD-10-CM | POA: Diagnosis not present

## 2020-11-19 DIAGNOSIS — E440 Moderate protein-calorie malnutrition: Secondary | ICD-10-CM | POA: Diagnosis not present

## 2020-11-19 DIAGNOSIS — M810 Age-related osteoporosis without current pathological fracture: Secondary | ICD-10-CM | POA: Diagnosis not present

## 2020-11-19 DIAGNOSIS — I349 Nonrheumatic mitral valve disorder, unspecified: Secondary | ICD-10-CM | POA: Diagnosis not present

## 2020-11-19 DIAGNOSIS — G20 Parkinson's disease: Secondary | ICD-10-CM | POA: Diagnosis not present

## 2020-11-19 DIAGNOSIS — S72012D Unspecified intracapsular fracture of left femur, subsequent encounter for closed fracture with routine healing: Secondary | ICD-10-CM | POA: Diagnosis not present

## 2020-11-19 DIAGNOSIS — Z9181 History of falling: Secondary | ICD-10-CM | POA: Diagnosis not present

## 2020-11-21 DIAGNOSIS — M810 Age-related osteoporosis without current pathological fracture: Secondary | ICD-10-CM | POA: Diagnosis not present

## 2020-11-21 DIAGNOSIS — J45909 Unspecified asthma, uncomplicated: Secondary | ICD-10-CM | POA: Diagnosis not present

## 2020-11-21 DIAGNOSIS — G20 Parkinson's disease: Secondary | ICD-10-CM | POA: Diagnosis not present

## 2020-11-21 DIAGNOSIS — Z79891 Long term (current) use of opiate analgesic: Secondary | ICD-10-CM | POA: Diagnosis not present

## 2020-11-21 DIAGNOSIS — Z7982 Long term (current) use of aspirin: Secondary | ICD-10-CM | POA: Diagnosis not present

## 2020-11-21 DIAGNOSIS — Z853 Personal history of malignant neoplasm of breast: Secondary | ICD-10-CM | POA: Diagnosis not present

## 2020-11-21 DIAGNOSIS — E440 Moderate protein-calorie malnutrition: Secondary | ICD-10-CM | POA: Diagnosis not present

## 2020-11-21 DIAGNOSIS — E785 Hyperlipidemia, unspecified: Secondary | ICD-10-CM | POA: Diagnosis not present

## 2020-11-21 DIAGNOSIS — Z791 Long term (current) use of non-steroidal anti-inflammatories (NSAID): Secondary | ICD-10-CM | POA: Diagnosis not present

## 2020-11-21 DIAGNOSIS — D649 Anemia, unspecified: Secondary | ICD-10-CM | POA: Diagnosis not present

## 2020-11-21 DIAGNOSIS — M1990 Unspecified osteoarthritis, unspecified site: Secondary | ICD-10-CM | POA: Diagnosis not present

## 2020-11-21 DIAGNOSIS — Z9181 History of falling: Secondary | ICD-10-CM | POA: Diagnosis not present

## 2020-11-21 DIAGNOSIS — Z7401 Bed confinement status: Secondary | ICD-10-CM | POA: Diagnosis not present

## 2020-11-21 DIAGNOSIS — I349 Nonrheumatic mitral valve disorder, unspecified: Secondary | ICD-10-CM | POA: Diagnosis not present

## 2020-11-21 DIAGNOSIS — M4186 Other forms of scoliosis, lumbar region: Secondary | ICD-10-CM | POA: Diagnosis not present

## 2020-11-21 DIAGNOSIS — S72012D Unspecified intracapsular fracture of left femur, subsequent encounter for closed fracture with routine healing: Secondary | ICD-10-CM | POA: Diagnosis not present

## 2020-11-21 DIAGNOSIS — M47816 Spondylosis without myelopathy or radiculopathy, lumbar region: Secondary | ICD-10-CM | POA: Diagnosis not present

## 2020-11-21 DIAGNOSIS — T84021A Dislocation of internal left hip prosthesis, initial encounter: Secondary | ICD-10-CM | POA: Diagnosis not present

## 2020-11-21 DIAGNOSIS — G5730 Lesion of lateral popliteal nerve, unspecified lower limb: Secondary | ICD-10-CM | POA: Diagnosis not present

## 2020-11-26 DIAGNOSIS — S73005A Unspecified dislocation of left hip, initial encounter: Secondary | ICD-10-CM | POA: Diagnosis not present

## 2020-12-02 ENCOUNTER — Emergency Department: Payer: PPO

## 2020-12-02 ENCOUNTER — Inpatient Hospital Stay
Admission: EM | Admit: 2020-12-02 | Discharge: 2020-12-05 | DRG: 871 | Disposition: A | Discharge status: Home Health | Payer: PPO | Attending: Internal Medicine | Admitting: Internal Medicine

## 2020-12-02 ENCOUNTER — Other Ambulatory Visit: Payer: Self-pay

## 2020-12-02 DIAGNOSIS — D62 Acute posthemorrhagic anemia: Secondary | ICD-10-CM | POA: Diagnosis present

## 2020-12-02 DIAGNOSIS — E43 Unspecified severe protein-calorie malnutrition: Secondary | ICD-10-CM | POA: Diagnosis not present

## 2020-12-02 DIAGNOSIS — A09 Infectious gastroenteritis and colitis, unspecified: Secondary | ICD-10-CM | POA: Diagnosis present

## 2020-12-02 DIAGNOSIS — J9601 Acute respiratory failure with hypoxia: Secondary | ICD-10-CM | POA: Diagnosis not present

## 2020-12-02 DIAGNOSIS — Z923 Personal history of irradiation: Secondary | ICD-10-CM | POA: Diagnosis not present

## 2020-12-02 DIAGNOSIS — R197 Diarrhea, unspecified: Secondary | ICD-10-CM | POA: Diagnosis not present

## 2020-12-02 DIAGNOSIS — Z66 Do not resuscitate: Secondary | ICD-10-CM | POA: Diagnosis present

## 2020-12-02 DIAGNOSIS — I200 Unstable angina: Secondary | ICD-10-CM | POA: Diagnosis not present

## 2020-12-02 DIAGNOSIS — Z7189 Other specified counseling: Secondary | ICD-10-CM | POA: Diagnosis not present

## 2020-12-02 DIAGNOSIS — A0472 Enterocolitis due to Clostridium difficile, not specified as recurrent: Secondary | ICD-10-CM | POA: Diagnosis present

## 2020-12-02 DIAGNOSIS — Z853 Personal history of malignant neoplasm of breast: Secondary | ICD-10-CM

## 2020-12-02 DIAGNOSIS — G20 Parkinson's disease: Secondary | ICD-10-CM | POA: Diagnosis present

## 2020-12-02 DIAGNOSIS — Z79899 Other long term (current) drug therapy: Secondary | ICD-10-CM

## 2020-12-02 DIAGNOSIS — Z803 Family history of malignant neoplasm of breast: Secondary | ICD-10-CM | POA: Diagnosis not present

## 2020-12-02 DIAGNOSIS — N179 Acute kidney failure, unspecified: Secondary | ICD-10-CM | POA: Diagnosis present

## 2020-12-02 DIAGNOSIS — M810 Age-related osteoporosis without current pathological fracture: Secondary | ICD-10-CM | POA: Diagnosis not present

## 2020-12-02 DIAGNOSIS — R1084 Generalized abdominal pain: Secondary | ICD-10-CM

## 2020-12-02 DIAGNOSIS — A414 Sepsis due to anaerobes: Principal | ICD-10-CM | POA: Diagnosis present

## 2020-12-02 DIAGNOSIS — F17210 Nicotine dependence, cigarettes, uncomplicated: Secondary | ICD-10-CM | POA: Diagnosis not present

## 2020-12-02 DIAGNOSIS — K567 Ileus, unspecified: Secondary | ICD-10-CM | POA: Diagnosis present

## 2020-12-02 DIAGNOSIS — A419 Sepsis, unspecified organism: Secondary | ICD-10-CM

## 2020-12-02 DIAGNOSIS — G9341 Metabolic encephalopathy: Secondary | ICD-10-CM | POA: Diagnosis present

## 2020-12-02 DIAGNOSIS — Z515 Encounter for palliative care: Secondary | ICD-10-CM

## 2020-12-02 DIAGNOSIS — G9340 Encephalopathy, unspecified: Secondary | ICD-10-CM | POA: Diagnosis not present

## 2020-12-02 DIAGNOSIS — L89892 Pressure ulcer of other site, stage 2: Secondary | ICD-10-CM | POA: Diagnosis present

## 2020-12-02 DIAGNOSIS — I248 Other forms of acute ischemic heart disease: Secondary | ICD-10-CM | POA: Diagnosis present

## 2020-12-02 DIAGNOSIS — Z8249 Family history of ischemic heart disease and other diseases of the circulatory system: Secondary | ICD-10-CM | POA: Diagnosis not present

## 2020-12-02 DIAGNOSIS — R0902 Hypoxemia: Secondary | ICD-10-CM | POA: Diagnosis not present

## 2020-12-02 DIAGNOSIS — Z7401 Bed confinement status: Secondary | ICD-10-CM

## 2020-12-02 DIAGNOSIS — K5939 Other megacolon: Secondary | ICD-10-CM | POA: Diagnosis not present

## 2020-12-02 DIAGNOSIS — E860 Dehydration: Secondary | ICD-10-CM | POA: Diagnosis present

## 2020-12-02 DIAGNOSIS — J45909 Unspecified asthma, uncomplicated: Secondary | ICD-10-CM | POA: Diagnosis present

## 2020-12-02 DIAGNOSIS — Z20822 Contact with and (suspected) exposure to covid-19: Secondary | ICD-10-CM | POA: Diagnosis present

## 2020-12-02 DIAGNOSIS — R140 Abdominal distension (gaseous): Secondary | ICD-10-CM | POA: Diagnosis not present

## 2020-12-02 DIAGNOSIS — Z823 Family history of stroke: Secondary | ICD-10-CM | POA: Diagnosis not present

## 2020-12-02 DIAGNOSIS — R6520 Severe sepsis without septic shock: Secondary | ICD-10-CM | POA: Diagnosis present

## 2020-12-02 DIAGNOSIS — E785 Hyperlipidemia, unspecified: Secondary | ICD-10-CM | POA: Diagnosis present

## 2020-12-02 DIAGNOSIS — K6389 Other specified diseases of intestine: Secondary | ICD-10-CM | POA: Diagnosis not present

## 2020-12-02 DIAGNOSIS — Z885 Allergy status to narcotic agent status: Secondary | ICD-10-CM

## 2020-12-02 DIAGNOSIS — R079 Chest pain, unspecified: Secondary | ICD-10-CM | POA: Diagnosis not present

## 2020-12-02 DIAGNOSIS — Z96642 Presence of left artificial hip joint: Secondary | ICD-10-CM | POA: Diagnosis not present

## 2020-12-02 DIAGNOSIS — R7989 Other specified abnormal findings of blood chemistry: Secondary | ICD-10-CM | POA: Diagnosis not present

## 2020-12-02 DIAGNOSIS — L8990 Pressure ulcer of unspecified site, unspecified stage: Secondary | ICD-10-CM | POA: Insufficient documentation

## 2020-12-02 DIAGNOSIS — Z888 Allergy status to other drugs, medicaments and biological substances status: Secondary | ICD-10-CM

## 2020-12-02 DIAGNOSIS — M419 Scoliosis, unspecified: Secondary | ICD-10-CM | POA: Diagnosis not present

## 2020-12-02 DIAGNOSIS — L89221 Pressure ulcer of left hip, stage 1: Secondary | ICD-10-CM | POA: Diagnosis not present

## 2020-12-02 DIAGNOSIS — R109 Unspecified abdominal pain: Secondary | ICD-10-CM | POA: Diagnosis not present

## 2020-12-02 DIAGNOSIS — J9811 Atelectasis: Secondary | ICD-10-CM | POA: Diagnosis not present

## 2020-12-02 LAB — COMPREHENSIVE METABOLIC PANEL
ALT: <5 U/L (ref 0–44)
AST: 15 U/L (ref 15–41)
Albumin: 2.9 g/dL — ABNORMAL LOW (ref 3.5–5.0)
Alkaline Phosphatase: 117 U/L (ref 38–126)
Anion gap: 10 (ref 5–15)
BUN: 48 mg/dL — ABNORMAL HIGH (ref 8–23)
CO2: 27 mmol/L (ref 22–32)
Calcium: 9.6 mg/dL (ref 8.9–10.3)
Chloride: 99 mmol/L (ref 98–111)
Creatinine, Ser: 1.07 mg/dL — ABNORMAL HIGH (ref 0.44–1.00)
GFR, Estimated: 53 mL/min — ABNORMAL LOW (ref >60)
Glucose, Bld: 114 mg/dL — ABNORMAL HIGH (ref 70–99)
Potassium: 3.8 mmol/L (ref 3.5–5.1)
Sodium: 136 mmol/L (ref 135–145)
Total Bilirubin: 1.1 mg/dL (ref 0.3–1.2)
Total Protein: 6.6 g/dL (ref 6.5–8.1)

## 2020-12-02 LAB — CBC
HCT: 30.1 % — ABNORMAL LOW (ref 36.0–46.0)
Hemoglobin: 10.3 g/dL — ABNORMAL LOW (ref 12.0–15.0)
MCH: 29.5 pg (ref 26.0–34.0)
MCHC: 34.2 g/dL (ref 30.0–36.0)
MCV: 86.2 fL (ref 80.0–100.0)
Platelets: 483 10*3/uL — ABNORMAL HIGH (ref 150–400)
RBC: 3.49 MIL/uL — ABNORMAL LOW (ref 3.87–5.11)
RDW: 16.2 % — ABNORMAL HIGH (ref 11.5–15.5)
WBC: 22.5 10*3/uL — ABNORMAL HIGH (ref 4.0–10.5)
nRBC: 0 % (ref 0.0–0.2)

## 2020-12-02 LAB — RESP PANEL BY RT-PCR (FLU A&B, COVID) ARPGX2
Influenza A by PCR: NEGATIVE
Influenza B by PCR: NEGATIVE
SARS Coronavirus 2 by RT PCR: NEGATIVE

## 2020-12-02 LAB — MAGNESIUM: Magnesium: 2.1 mg/dL (ref 1.7–2.4)

## 2020-12-02 LAB — LIPASE, BLOOD: Lipase: 18 U/L (ref 11–51)

## 2020-12-02 LAB — PROCALCITONIN: Procalcitonin: 1.27 ng/mL

## 2020-12-02 LAB — TROPONIN I (HIGH SENSITIVITY): Troponin I (High Sensitivity): 82 ng/L — ABNORMAL HIGH (ref <18)

## 2020-12-02 MED ORDER — IOHEXOL 350 MG/ML SOLN
80.0000 mL | Freq: Once | INTRAVENOUS | Status: AC | PRN
  Administered 2020-12-02: 80 mL via INTRAVENOUS

## 2020-12-02 MED ORDER — LORAZEPAM 2 MG/ML IJ SOLN
1.0000 mg | Freq: Once | INTRAMUSCULAR | Status: AC
  Administered 2020-12-02: 1 mg via INTRAVENOUS
  Filled 2020-12-02: qty 1

## 2020-12-02 MED ORDER — SODIUM CHLORIDE 0.9 % IV SOLN
1.0000 g | Freq: Once | INTRAVENOUS | Status: AC
  Administered 2020-12-03: 1 g via INTRAVENOUS
  Filled 2020-12-02: qty 10

## 2020-12-02 MED ORDER — LACTATED RINGERS IV BOLUS
1000.0000 mL | Freq: Once | INTRAVENOUS | Status: AC
  Administered 2020-12-02: 1000 mL via INTRAVENOUS

## 2020-12-02 MED ORDER — LORAZEPAM 2 MG/ML IJ SOLN
1.0000 mg | Freq: Once | INTRAMUSCULAR | Status: AC
  Administered 2020-12-03: 1 mg via INTRAVENOUS
  Filled 2020-12-02: qty 1

## 2020-12-02 MED ORDER — CARBIDOPA-LEVODOPA 25-100 MG PO TABS
1.0000 | ORAL_TABLET | Freq: Every day | ORAL | Status: DC
  Administered 2020-12-03 – 2020-12-05 (×14): 1 via ORAL
  Filled 2020-12-02 (×18): qty 1

## 2020-12-02 MED ORDER — ASPIRIN 81 MG PO CHEW
324.0000 mg | CHEWABLE_TABLET | Freq: Once | ORAL | Status: AC
  Administered 2020-12-03: 324 mg via ORAL
  Filled 2020-12-02: qty 4

## 2020-12-02 NOTE — ED Notes (Signed)
MD at bedside. 

## 2020-12-02 NOTE — ED Triage Notes (Signed)
Pt arrives via ems from home, has been bed bound for 7 weeks due to post hip surgery, for the past few days she has been having watery black/green stool,c/o abd bloating and no appetite for the past 2 days

## 2020-12-02 NOTE — ED Notes (Signed)
Patient transported to CT 

## 2020-12-02 NOTE — ED Notes (Signed)
MD notified patient oxygen 86-88% on RA, patient placed on 2L [place]

## 2020-12-02 NOTE — ED Triage Notes (Signed)
First Nurse Note: C/O black watery stools x 1 weeks.  Hip replacement 7/3.  VS wnl.

## 2020-12-02 NOTE — ED Provider Notes (Signed)
[facility] Emergency Department Provider Note   ____________________________________________   Event Date/Time   First MD Initiated Contact with Patient 12/02/20 2133     (approximate)  I have reviewed the triage vital signs and the nursing notes.   HISTORY  Chief Complaint Abdominal Pain    HPI Jasmine Colon is a 80 y.o. female with past medical history of Parkinson disease, asthma, and hyperlipidemia who presents to the ED complaining of abdominal pain.  Patient reports that she has had 3 to 4 days of gradually worsening pain in her lower abdomen that has been occurring intermittently.  She describes it as sharp and stabbing, can come on at any time and is not exacerbated or alleviated by anything in particular.  It does not affect 1 side of her abdomen anymore than the other.  She has felt nauseous with a few episodes of dry heaving, but denies vomiting.  She does endorse diarrhea with multiple episodes of dark foul-smelling stools.  She has not been on any antibiotics recently and she has not noticed any blood in her stool.  She denies any fevers, cough, chest pain, or shortness of breath.  She has not had any dysuria or hematuria.  She has been bedbound for the past 7 weeks due to recent hip replacement.        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 Hormone positive, HER 2 no amplified   Mitral valve disorder    Osteoarthritis    Osteoporosis    Parkinson's disease ([place]) 2012   Personal history of radiation therapy 2013   right breast ca with lumpectomy and rad tx    Patient Active Problem List   Diagnosis Date Noted   Hip dislocation, left ([place]) 10/03/2020   Dislocated hip ([place]) 09/28/2020   Dislocation of hip prosthesis, initial encounter ([place]) 09/27/2020    Dislocation of hip joint prosthesis, initial encounter ([place]) 09/27/2020   Postoperative anemia 09/27/2020   AKI (acute kidney injury) ([place]) 09/27/2020   Malnutrition of moderate degree 09/27/2020   Accidental fall    Hyperlipidemia    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 Jamal Collin. Benign   BREAST LUMPECTOMY Right 11/13/2011   invasive mammary ca, DCIS, LCIS in specimen. Clear margins.  LN negative   BREAST SURGERY Right 2013   wide excision   CESAREAN SECTION  31 years ago   COLONOSCOPY  2008   HIP ARTHROPLASTY Left 09/21/2020   Procedure: ARTHROPLASTY BIPOLAR HIP (HEMIARTHROPLASTY);  Surgeon: Earnestine Leys, MD;  Location: [facility] ORS;  Service: Orthopedics;  Laterality: Left;   HIP CLOSED REDUCTION Left 09/27/2020   Procedure: CLOSED REDUCTION HIP;  Surgeon: Renee Harder, MD;  Location:  [facility] ORS;  Service: Orthopedics;  Laterality: Left;   TOTAL HIP REVISION Left 10/07/2020   Procedure: TOTAL HIP REVISION;  Surgeon: Lovell Sheehan, MD;  Location: [facility] ORS;  Service: Orthopedics;  Laterality: Left;    Prior to Admission medications   Medication Sig Start Date End Date Taking? Authorizing Provider  acetaminophen (TYLENOL) 650 MG CR tablet Take 650 mg by mouth every 8 (eight) hours as needed for pain.   Yes [provider]  Calcium Carbonate-Vitamin D (CALTRATE 600+D PO) Take 1 tablet by mouth daily.   Yes  [provider]  carbidopa-levodopa (SINEMET IR) 25-100 MG tablet Take 1 tablet by mouth 6 (six) times daily. 09/24/20  Yes Fritzi Mandes, MD  cyanocobalamin 100 MCG tablet Take 100 mcg by mouth daily.   Yes [provider]  feeding supplement (ENSURE ENLIVE / ENSURE PLUS) LIQD Take 237 mLs by mouth 3 (three) times daily between meals. 09/29/20  Yes Danford, Suann Larry, MD  ferrous sulfate 325 (65 FE) MG tablet Take 1 tablet (325 mg total) by mouth daily with breakfast. 09/25/20  Yes Fritzi Mandes, MD  fluticasone [facility]) 50 MCG/ACT nasal spray Place 2 sprays into both nostrils daily as needed for allergies or rhinitis.   Yes [provider]  loratadine (CLARITIN) 10 MG tablet Take 10 mg by mouth daily as needed for allergies.   Yes [provider]  mirtazapine (REMERON) 30 MG tablet Take 30 mg by mouth at bedtime. 06/27/20  Yes [provider]  Multiple Vitamins-Minerals (PRESERVISION AREDS PO) Take 1 capsule by mouth daily.   Yes [provider]  rasagiline (AZILECT) 1 MG TABS tablet Take 1 tablet (1 mg total) by mouth daily. 09/25/20  Yes Fritzi Mandes, MD  sodium chloride (OCEAN) 0.65 % SOLN nasal spray Place 1 spray into both nostrils daily as needed (allergies).   Yes [provider]    Allergies Codeine and Pseudoephedrine  Family History  Problem Relation Age of Onset   Stroke Mother    Heart failure Father    Heart disease Brother        bypass   Cancer Maternal Aunt        breast    Breast cancer Maternal Aunt    Cancer Maternal Aunt        breast   Breast cancer Maternal Aunt     Social History Social History   Tobacco Use   Smoking status: Every Day    Packs/day: 0.50    Years: 45.00    Pack years: 22.50    Types: Cigarettes   Smokeless tobacco: Never  Substance Use Topics   Alcohol use: No   Drug use: No    Review of Systems  Constitutional: No fever/chills Eyes: No visual changes. ENT: No sore  throat. Cardiovascular: Denies chest pain. Respiratory: Denies shortness of breath. Gastrointestinal: Positive for abdominal pain, nausea, vomiting, and diarrhea.  No constipation. Genitourinary: Negative for dysuria. Musculoskeletal: Negative for back pain. Skin: Negative for rash. Neurological: Negative for headaches, focal weakness or numbness.  ____________________________________________   PHYSICAL EXAM:  VITAL SIGNS: ED Triage Vitals  Enc Vitals Group     BP 12/02/20 1457 (!) 139/98     Pulse Rate 12/02/20 1457 (!) 103     Resp 12/02/20 1457 16     Temp 12/02/20 1457 97.9 F (36.6 C)     Temp Source 12/02/20 1457 Oral     SpO2 12/02/20 1457 91 %     Weight  12/02/20 1458 110 lb (49.9 kg)     Height 12/02/20 1458 '5\' 5"'$  (1.651 m)     Head Circumference --      Peak Flow --      Pain Score 12/02/20 1458 0     Pain Loc --      Pain Edu? --      Excl. in [place]? --     Constitutional: Alert and oriented. Eyes: Conjunctivae are normal. Head: Atraumatic. Nose: No congestion/rhinnorhea. Mouth/Throat: Mucous membranes are dry. Neck: Normal ROM Cardiovascular: Normal rate, regular rhythm. Grossly normal heart sounds. Respiratory: Normal respiratory effort.  No retractions. Lungs CTAB. Gastrointestinal: Soft and tender to palpation in the bilateral lower quadrants, left greater than right.  No rebound or guarding. No distention. Genitourinary: deferred Musculoskeletal: No lower extremity tenderness nor edema. Neurologic:  Normal speech and language. No gross focal neurologic deficits are appreciated.  Tremor noted. Skin:  Skin is warm, dry and intact. No rash noted. Psychiatric: Mood and affect are normal. Speech and behavior are normal.  ____________________________________________   LABS (all labs ordered are listed, but only abnormal results are displayed)  Labs Reviewed  COMPREHENSIVE METABOLIC PANEL - Abnormal; Notable for the following components:      Result Value    Glucose, Bld 114 (*)    BUN 48 (*)    Creatinine, Ser 1.07 (*)    Albumin 2.9 (*)    GFR, Estimated 53 (*)    All other components within normal limits  CBC - Abnormal; Notable for the following components:   WBC 22.5 (*)    RBC 3.49 (*)    Hemoglobin 10.3 (*)    HCT 30.1 (*)    RDW 16.2 (*)    Platelets 483 (*)    All other components within normal limits  TROPONIN I (HIGH SENSITIVITY) - Abnormal; Notable for the following components:   Troponin I (High Sensitivity) 82 (*)    All other components within normal limits  RESP PANEL BY RT-PCR (FLU A&B, COVID) ARPGX2  GASTROINTESTINAL PANEL BY PCR, STOOL (REPLACES STOOL CULTURE)  C DIFFICILE QUICK SCREEN W PCR REFLEX    CULTURE, BLOOD (SINGLE)  LIPASE, BLOOD  URINALYSIS, COMPLETE (UACMP) WITH MICROSCOPIC  LACTIC ACID, PLASMA  LACTIC ACID, PLASMA  PROTIME-INR  APTT  PROCALCITONIN  MAGNESIUM   ____________________________________________   PROCEDURES  Procedure(s) performed (including Critical Care):  Procedures   ____________________________________________   INITIAL IMPRESSION / ASSESSMENT AND PLAN / ED COURSE      80 year old female with past medical history of Parkinson disease, asthma, and hyperlipidemia who presents to the ED complaining of abdominal pain, nausea, dry heaves, and diarrhea for the past 3 to 4 days.  She does appear dehydrated and has tenderness to palpation greatest in the left lower quadrant of her abdomen.  We will further assess with CT scan for diverticulitis, especially given patient's leukocytosis.  I would consider C. difficile although she has not been on any antibiotics recently, stool studies are pending.  Low suspicion for GI bleed as hemoglobin is improved from previous.  Additional labs are remarkable for elevated BUN to creatinine ratio, consistent with dehydration.  We will hydrate with IV fluids and patient declines pain or nausea medicine at this time.  After patient had returned  from CT scan, she was noted to have O2 sats in the mid to high 80s, not in any respiratory distress.  She was placed on 2 L nasal cannula with improvement.  We will add on  EKG, troponin, and CTA of chest given her recent orthopedic interventions and bedbound status.  We will also perform testing for COVID-19.  Abdominal imaging is limited due to motion artifact, does appear consistent with an ileus.  With similar motion artifact, CTA of chest would be nondiagnostic.  Patient's husband states that she typically improves with a muscle relaxant, we will give 1 mg of IV Ativan to facilitate CTA of her chest.  Patient turned over to oncoming rider pending CTA results and admission for ileus and respiratory failure.      ____________________________________________   FINAL CLINICAL IMPRESSION(S) / ED DIAGNOSES  Final diagnoses:  Generalized abdominal pain  Diarrhea, unspecified type  Acute respiratory failure with hypoxia [facility])     ED Discharge Orders     None        Note:  This document was prepared using Dragon voice recognition software and may include unintentional dictation errors.    Blake Divine, MD 12/02/20 [phone]

## 2020-12-03 ENCOUNTER — Encounter: Payer: Self-pay | Admitting: Family Medicine

## 2020-12-03 ENCOUNTER — Other Ambulatory Visit: Payer: Self-pay

## 2020-12-03 ENCOUNTER — Inpatient Hospital Stay: Payer: PPO

## 2020-12-03 DIAGNOSIS — D62 Acute posthemorrhagic anemia: Secondary | ICD-10-CM | POA: Diagnosis present

## 2020-12-03 DIAGNOSIS — K567 Ileus, unspecified: Secondary | ICD-10-CM | POA: Diagnosis present

## 2020-12-03 DIAGNOSIS — Z20822 Contact with and (suspected) exposure to covid-19: Secondary | ICD-10-CM | POA: Diagnosis present

## 2020-12-03 DIAGNOSIS — A0472 Enterocolitis due to Clostridium difficile, not specified as recurrent: Secondary | ICD-10-CM | POA: Diagnosis present

## 2020-12-03 DIAGNOSIS — Z823 Family history of stroke: Secondary | ICD-10-CM | POA: Diagnosis not present

## 2020-12-03 DIAGNOSIS — R6520 Severe sepsis without septic shock: Secondary | ICD-10-CM | POA: Diagnosis present

## 2020-12-03 DIAGNOSIS — Z7189 Other specified counseling: Secondary | ICD-10-CM | POA: Diagnosis not present

## 2020-12-03 DIAGNOSIS — G9341 Metabolic encephalopathy: Secondary | ICD-10-CM | POA: Diagnosis present

## 2020-12-03 DIAGNOSIS — Z96642 Presence of left artificial hip joint: Secondary | ICD-10-CM | POA: Diagnosis present

## 2020-12-03 DIAGNOSIS — A09 Infectious gastroenteritis and colitis, unspecified: Secondary | ICD-10-CM

## 2020-12-03 DIAGNOSIS — J9601 Acute respiratory failure with hypoxia: Secondary | ICD-10-CM | POA: Diagnosis present

## 2020-12-03 DIAGNOSIS — E43 Unspecified severe protein-calorie malnutrition: Secondary | ICD-10-CM | POA: Diagnosis present

## 2020-12-03 DIAGNOSIS — F17210 Nicotine dependence, cigarettes, uncomplicated: Secondary | ICD-10-CM | POA: Diagnosis present

## 2020-12-03 DIAGNOSIS — M810 Age-related osteoporosis without current pathological fracture: Secondary | ICD-10-CM | POA: Diagnosis present

## 2020-12-03 DIAGNOSIS — N179 Acute kidney failure, unspecified: Secondary | ICD-10-CM | POA: Diagnosis present

## 2020-12-03 DIAGNOSIS — J45909 Unspecified asthma, uncomplicated: Secondary | ICD-10-CM | POA: Diagnosis present

## 2020-12-03 DIAGNOSIS — Z8249 Family history of ischemic heart disease and other diseases of the circulatory system: Secondary | ICD-10-CM | POA: Diagnosis not present

## 2020-12-03 DIAGNOSIS — Z66 Do not resuscitate: Secondary | ICD-10-CM | POA: Diagnosis present

## 2020-12-03 DIAGNOSIS — Z923 Personal history of irradiation: Secondary | ICD-10-CM | POA: Diagnosis not present

## 2020-12-03 DIAGNOSIS — E860 Dehydration: Secondary | ICD-10-CM

## 2020-12-03 DIAGNOSIS — I248 Other forms of acute ischemic heart disease: Secondary | ICD-10-CM | POA: Diagnosis present

## 2020-12-03 DIAGNOSIS — Z853 Personal history of malignant neoplasm of breast: Secondary | ICD-10-CM | POA: Diagnosis not present

## 2020-12-03 DIAGNOSIS — E785 Hyperlipidemia, unspecified: Secondary | ICD-10-CM | POA: Diagnosis present

## 2020-12-03 DIAGNOSIS — L8990 Pressure ulcer of unspecified site, unspecified stage: Secondary | ICD-10-CM | POA: Insufficient documentation

## 2020-12-03 DIAGNOSIS — R1084 Generalized abdominal pain: Secondary | ICD-10-CM | POA: Diagnosis not present

## 2020-12-03 DIAGNOSIS — A419 Sepsis, unspecified organism: Secondary | ICD-10-CM | POA: Diagnosis not present

## 2020-12-03 DIAGNOSIS — A414 Sepsis due to anaerobes: Secondary | ICD-10-CM | POA: Diagnosis present

## 2020-12-03 DIAGNOSIS — G20 Parkinson's disease: Secondary | ICD-10-CM | POA: Diagnosis present

## 2020-12-03 DIAGNOSIS — Z515 Encounter for palliative care: Secondary | ICD-10-CM | POA: Diagnosis not present

## 2020-12-03 DIAGNOSIS — R109 Unspecified abdominal pain: Secondary | ICD-10-CM | POA: Diagnosis present

## 2020-12-03 DIAGNOSIS — Z803 Family history of malignant neoplasm of breast: Secondary | ICD-10-CM | POA: Diagnosis not present

## 2020-12-03 LAB — GASTROINTESTINAL PANEL BY PCR, STOOL (REPLACES STOOL CULTURE)

## 2020-12-03 LAB — URINALYSIS, COMPLETE (UACMP) WITH MICROSCOPIC
Bacteria, UA: NONE SEEN
Bilirubin Urine: NEGATIVE
Glucose, UA: NEGATIVE mg/dL
Hgb urine dipstick: NEGATIVE
Ketones, ur: 5 mg/dL — AB
Leukocytes,Ua: NEGATIVE
Nitrite: NEGATIVE
Protein, ur: NEGATIVE mg/dL
Specific Gravity, Urine: >1.046 — ABNORMAL HIGH (ref 1.005–1.030)
pH: 5 (ref 5.0–8.0)

## 2020-12-03 LAB — CBC
HCT: 33.1 % — ABNORMAL LOW (ref 36.0–46.0)
Hemoglobin: 10.3 g/dL — ABNORMAL LOW (ref 12.0–15.0)
MCH: 28.3 pg (ref 26.0–34.0)
MCHC: 31.1 g/dL (ref 30.0–36.0)
MCV: 90.9 fL (ref 80.0–100.0)
Platelets: 415 10*3/uL — ABNORMAL HIGH (ref 150–400)
RBC: 3.64 MIL/uL — ABNORMAL LOW (ref 3.87–5.11)
RDW: 16.8 % — ABNORMAL HIGH (ref 11.5–15.5)
WBC: 8.4 10*3/uL (ref 4.0–10.5)
nRBC: 0 % (ref 0.0–0.2)

## 2020-12-03 LAB — PROCALCITONIN: Procalcitonin: 0.53 ng/mL

## 2020-12-03 LAB — TROPONIN I (HIGH SENSITIVITY)
Troponin I (High Sensitivity): 8 ng/L (ref <18)
Troponin I (High Sensitivity): 7 ng/L (ref <18)

## 2020-12-03 LAB — BASIC METABOLIC PANEL
Anion gap: 12 (ref 5–15)
BUN: 46 mg/dL — ABNORMAL HIGH (ref 8–23)
CO2: 24 mmol/L (ref 22–32)
Calcium: 9 mg/dL (ref 8.9–10.3)
Chloride: 101 mmol/L (ref 98–111)
Creatinine, Ser: 1.06 mg/dL — ABNORMAL HIGH (ref 0.44–1.00)
GFR, Estimated: 54 mL/min — ABNORMAL LOW (ref >60)
Glucose, Bld: 98 mg/dL (ref 70–99)
Potassium: 3.6 mmol/L (ref 3.5–5.1)
Sodium: 137 mmol/L (ref 135–145)

## 2020-12-03 LAB — CLOSTRIDIUM DIFFICILE BY PCR, REFLEXED: Toxigenic C. Difficile by PCR: POSITIVE — AB

## 2020-12-03 LAB — C DIFFICILE QUICK SCREEN W PCR REFLEX
C Diff antigen: POSITIVE — AB
C Diff toxin: NEGATIVE

## 2020-12-03 LAB — LACTIC ACID, PLASMA
Lactic Acid, Venous: 1.2 mmol/L (ref 0.5–1.9)
Lactic Acid, Venous: 1.3 mmol/L (ref 0.5–1.9)

## 2020-12-03 LAB — PROTIME-INR
INR: 1.1 (ref 0.8–1.2)
Prothrombin Time: 14 seconds (ref 11.4–15.2)

## 2020-12-03 LAB — CORTISOL-AM, BLOOD: Cortisol - AM: 27.2 ug/dL — ABNORMAL HIGH (ref 6.7–22.6)

## 2020-12-03 MED ORDER — ENSURE ENLIVE PO LIQD
237.0000 mL | Freq: Three times a day (TID) | ORAL | Status: DC
  Administered 2020-12-03 – 2020-12-05 (×6): 237 mL via ORAL

## 2020-12-03 MED ORDER — IPRATROPIUM-ALBUTEROL 0.5-2.5 (3) MG/3ML IN SOLN: 3.0000 mL | RESPIRATORY_TRACT | Status: DC | PRN

## 2020-12-03 MED ORDER — ASPIRIN EC 81 MG PO TBEC
81.0000 mg | DELAYED_RELEASE_TABLET | Freq: Every day | ORAL | Status: DC
  Administered 2020-12-03 – 2020-12-05 (×3): 81 mg via ORAL
  Filled 2020-12-03 (×3): qty 1

## 2020-12-03 MED ORDER — VITAMIN B-12 100 MCG PO TABS
100.0000 ug | ORAL_TABLET | Freq: Every day | ORAL | Status: DC
  Administered 2020-12-03 – 2020-12-05 (×3): 100 ug via ORAL
  Filled 2020-12-03 (×3): qty 1

## 2020-12-03 MED ORDER — MIRTAZAPINE 15 MG PO TABS
30.0000 mg | ORAL_TABLET | Freq: Every day | ORAL | Status: DC
  Administered 2020-12-03 – 2020-12-04 (×2): 30 mg via ORAL
  Filled 2020-12-03 (×2): qty 2

## 2020-12-03 MED ORDER — TRAZODONE HCL 50 MG PO TABS
25.0000 mg | ORAL_TABLET | Freq: Every evening | ORAL | Status: DC | PRN
  Administered 2020-12-04: 25 mg via ORAL
  Filled 2020-12-03: qty 1

## 2020-12-03 MED ORDER — MORPHINE SULFATE (PF) 2 MG/ML IV SOLN: 1.0000 mg | INTRAVENOUS | Status: DC | PRN

## 2020-12-03 MED ORDER — ONDANSETRON HCL 4 MG/2ML IJ SOLN: 4.0000 mg | Freq: Four times a day (QID) | INTRAMUSCULAR | Status: DC | PRN

## 2020-12-03 MED ORDER — METRONIDAZOLE 500 MG/100ML IV SOLN: 500.0000 mg | Freq: Two times a day (BID) | INTRAVENOUS | Status: DC

## 2020-12-03 MED ORDER — METRONIDAZOLE 500 MG/100ML IV SOLN
500.0000 mg | Freq: Three times a day (TID) | INTRAVENOUS | Status: DC
Start: 2020-12-03 — End: 2020-12-05
  Administered 2020-12-03 – 2020-12-05 (×7): 500 mg via INTRAVENOUS
  Filled 2020-12-03 (×10): qty 100

## 2020-12-03 MED ORDER — ACETAMINOPHEN 650 MG RE SUPP: 650.0000 mg | Freq: Four times a day (QID) | RECTAL | Status: DC | PRN

## 2020-12-03 MED ORDER — SODIUM CHLORIDE 0.9 % IV SOLN
INTRAVENOUS | Status: DC
Start: 2020-12-03 — End: 2020-12-05

## 2020-12-03 MED ORDER — RASAGILINE MESYLATE 1 MG PO TABS
1.0000 mg | ORAL_TABLET | Freq: Every day | ORAL | Status: DC
  Administered 2020-12-03 – 2020-12-05 (×3): 1 mg via ORAL
  Filled 2020-12-03 (×3): qty 1

## 2020-12-03 MED ORDER — NITROGLYCERIN 0.4 MG SL SUBL: 0.4000 mg | SUBLINGUAL_TABLET | SUBLINGUAL | Status: DC | PRN

## 2020-12-03 MED ORDER — ACETAMINOPHEN 325 MG PO TABS
650.0000 mg | ORAL_TABLET | Freq: Four times a day (QID) | ORAL | Status: DC | PRN
  Administered 2020-12-05: 650 mg via ORAL
  Filled 2020-12-03: qty 2

## 2020-12-03 MED ORDER — VANCOMYCIN HCL 250 MG PO CAPS
500.0000 mg | ORAL_CAPSULE | Freq: Four times a day (QID) | ORAL | Status: DC
  Administered 2020-12-03 – 2020-12-04 (×5): 500 mg via ORAL
  Filled 2020-12-03 (×7): qty 2

## 2020-12-03 MED ORDER — ENOXAPARIN SODIUM 40 MG/0.4ML IJ SOSY
40.0000 mg | PREFILLED_SYRINGE | INTRAMUSCULAR | Status: DC
  Administered 2020-12-03 – 2020-12-04 (×2): 40 mg via SUBCUTANEOUS
  Filled 2020-12-03 (×3): qty 0.4

## 2020-12-03 MED ORDER — SALINE SPRAY 0.65 % NA SOLN
1.0000 | Freq: Every day | NASAL | Status: DC | PRN
  Filled 2020-12-03: qty 44

## 2020-12-03 MED ORDER — ONDANSETRON HCL 4 MG PO TABS
4.0000 mg | ORAL_TABLET | Freq: Four times a day (QID) | ORAL | Status: DC | PRN
  Administered 2020-12-04: 4 mg via ORAL
  Filled 2020-12-03: qty 1

## 2020-12-03 MED ORDER — SODIUM CHLORIDE 0.9 % IV SOLN: 2.0000 g | INTRAVENOUS | Status: DC

## 2020-12-03 MED ORDER — FLUTICASONE PROPIONATE 50 MCG/ACT NA SUSP
2.0000 | Freq: Every day | NASAL | Status: DC | PRN
  Filled 2020-12-03: qty 16

## 2020-12-03 MED ORDER — FERROUS SULFATE 325 (65 FE) MG PO TABS
325.0000 mg | ORAL_TABLET | Freq: Every day | ORAL | Status: DC
  Administered 2020-12-03 – 2020-12-05 (×3): 325 mg via ORAL
  Filled 2020-12-03 (×3): qty 1

## 2020-12-03 MED ORDER — IOHEXOL 350 MG/ML SOLN
50.0000 mL | Freq: Once | INTRAVENOUS | Status: AC | PRN
  Administered 2020-12-03: 50 mL via INTRAVENOUS

## 2020-12-03 MED ORDER — OCUVITE-LUTEIN PO CAPS
1.0000 | ORAL_CAPSULE | Freq: Every day | ORAL | Status: DC
  Administered 2020-12-03 – 2020-12-05 (×3): 1 via ORAL
  Filled 2020-12-03 (×3): qty 1

## 2020-12-03 MED ORDER — LORATADINE 10 MG PO TABS: 10.0000 mg | ORAL_TABLET | Freq: Every day | ORAL | Status: DC | PRN

## 2020-12-03 NOTE — Evaluation (Signed)
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 Hormone positive, HER 2 no amplified   Mitral valve disorder    Osteoarthritis    Osteoporosis    Parkinson's disease ([place]) 2012   Personal history of radiation therapy 2013   right breast ca with lumpectomy and rad tx   Past Surgical History:  Past Surgical History:  Procedure Laterality Date   BREAST BIOPSY Right 10/26/2011   invasive mammary carcinoma and DCIS   BREAST BIOPSY Left 01/09/2014   retroaerolar bx with Dr. Jamal Collin. Benign   BREAST LUMPECTOMY Right 11/13/2011   invasive mammary ca, DCIS, LCIS in specimen. Clear margins.  LN negative   BREAST SURGERY Right 2013   wide excision   CESAREAN SECTION  31 years ago   COLONOSCOPY  2008   HIP ARTHROPLASTY Left 09/21/2020   Procedure: ARTHROPLASTY BIPOLAR HIP (HEMIARTHROPLASTY);  Surgeon: Earnestine Leys, MD;  Location: [facility] ORS;  Service: Orthopedics;  Laterality: Left;   HIP CLOSED REDUCTION Left 09/27/2020   Procedure: CLOSED REDUCTION HIP;  Surgeon: Renee Harder, MD;  Location: [facility] ORS;  Service: Orthopedics;  Laterality: Left;   TOTAL HIP REVISION Left 10/07/2020   Procedure: TOTAL HIP REVISION;  Surgeon: Lovell Sheehan, MD;  Location: [facility] ORS;  Service: Orthopedics;  Laterality: Left;   HPI:  Jasmine Colon is a 80 y.o. female with past medical history of Parkinson Disease, asthma, and hyperlipidemia who presents to the ED complaining of abdominal pain.  Patient reports that she has had 3 to 4 days of gradually worsening pain in  her lower abdomen that has been occurring intermittently.  She describes it as sharp and stabbing, can come on at any time and is not exacerbated or alleviated by anything in particular.  It does not affect 1 side of her abdomen anymore than the other.  She has felt nauseous with a few episodes of dry heaving, but denies vomiting.  She does endorse diarrhea with multiple episodes of dark foul-smelling stools.  She has not been on any antibiotics recently and she has not noticed any blood in her stool.  She denies any fevers, cough, chest pain, or shortness of breath.  She has not had any dysuria or hematuria.  She has been bedbound for the past 7 weeks due to recent hip replacement.  CXR: No acute chest finding.  OF NOTE: Jasmine Colon had an episode described as "Patient thrashing in the bed" post admit -- unsure if related to Cognitive decline and/or any Cognitive decline related to the Baseline Parkinson's Dis. Jasmine Colon was given Ativan to calm per chart notes.   Assessment / Plan / Recommendation Clinical Impression  Jasmine Colon appears to present w/ oropharyngeal phase dysphagia in light of declined Cognitive status and overall general weakness. Unsure of Jasmine Colon's Baseline Cognitive status or if any dx of Dementia related to the Parkinson's Disease. This presenation can impact overall awareness/timing of swallow and safety during po tasks which increases risk for aspiration, choking. Jasmine Colon's risk for aspiration is present but can be reduced  when following aspiration precautions and using a modified diet consistency w/ thin liquids along w/ feeding assistance and Supervision during all oral intake. She required verbal/visual/tactile cues during po tasks.           Jasmine Colon consumed trials of thin liquids and purees w/ No overt clinical s/s of aspiration noted -- No coughing; No decline in vocal quality post trials; No decline in respiratory status during/post all trials. Oral phase was grossly adequate for bolus management and oral clearing of  the boluses given. Oral prep w/ all trials was impacted by Jasmine Colon's reduced Cognitive awareness -- using straw and closing lips on Cup rim was inconsistent by Jasmine Colon and required Mod verbal/tactile cues. Jasmine Colon often kept her eyes closed. She did not attempt self-feeding. Often exhibited muttered/mumbled speech. OM movement appeared [facility] for bolus management w/ No unilateral weakness noted. Confusion w/ OM tasks and oral care occurred -- Jasmine Colon bit down on SLP's fingers upon attempting ora/gumline check then clenched teeth.              D/t Jasmine Colon's declined Cognitive status, presentation of oral prep deficits and decreased awareness, and risk for aspiration, recommend dysphagia level 1 (Puree) diet w/ thin liquids - monitoring bolus size/amounts; aspiration precautions; reduce Distractions during meals and engage Jasmine Colon during po's at meal for self-feeding. Pills Crushed in Puree for safer swallowing. Support w/ feeding at meals. MD/NSG updated.     ST services recommends follow w/ Dietician for any concerns of reduced/poor oral intake w/ illness. ST services can follow Jasmine Colon at discharge for further as needed -- when Jasmine Colon's medical (GI) and Cognitive status' have improved to attempt diet upgrade to least restrictive solid foods. Jasmine Colon has GI restrictions requiring clear liquid diet per MD currently. Precautions posted in room. SLP Visit Diagnosis: Dysphagia, oral phase (R13.11) (oral prep deficits)    Aspiration Risk  Mild aspiration risk;Risk for inadequate nutrition/hydration    Diet Recommendation   dysphagia level 1 (Puree) diet w/ thin liquids - monitoring bolus size/amounts; aspiration precautions; reduce Distractions during meals and engage Jasmine Colon during po's at meal for self-feeding. Support w/ feeding at meals.   Medication Administration: Crushed with puree    Other  Recommendations Recommended Consults:  (Dietician f/u for support) Oral Care Recommendations: Oral care BID;Oral care before and after PO;Staff/trained caregiver  to provide oral care Other Recommendations:  (n/a)   Follow up Recommendations Skilled Nursing facility (TBD)      Frequency and Duration  (n/a)   (n/a)       Prognosis Prognosis for Safe Diet Advancement: Fair Barriers to Reach Goals: Cognitive deficits;Time post onset;Severity of deficits;Behavior      Swallow Study   General Date of Onset: 12/02/20 HPI: Jasmine Colon is a 80 y.o. female with past medical history of Parkinson Disease, asthma, and hyperlipidemia who presents to the ED complaining of abdominal pain.  Patient reports that she has had 3 to 4 days of gradually worsening pain in her lower abdomen that has been occurring intermittently.  She describes it as sharp and stabbing, can come on at any time and is not exacerbated or alleviated by anything in particular.  It does not affect 1 side of her abdomen anymore than the other.  She has felt nauseous with a few episodes of dry heaving, but denies vomiting.  She does endorse diarrhea with multiple episodes of dark foul-smelling stools.  She has not been on any antibiotics recently and she has not noticed any blood in her stool.  She denies any fevers, cough, chest pain, or shortness of breath.  She has not had any dysuria or hematuria.  She has been bedbound for the past 7 weeks due to recent hip replacement.  CXR: No acute chest finding.  OF NOTE: Jasmine Colon had an episode described as "Patient thrashing in the bed" post admit -- unsure if related to Cognitive decline and/or any Cognitive decline related to the Baseline Parkinson's Dis. Jasmine Colon was given Ativan to calm per chart notes. Type of Study: Bedside Swallow Evaluation Previous Swallow Assessment: none Diet Prior to this Study: Thin liquids (clears) Temperature Spikes Noted: No (wbc 8.4) Respiratory Status: Nasal cannula (2L) History of Recent Intubation: No Behavior/Cognition: Alert;Cooperative;Pleasant mood;Confused;Distractible;Requires cueing (unsure of Cognitive baseline) Oral Cavity  Assessment:  (appeared adequate but limited d/t Cognitive decline and participation) Oral Care Completed by SLP: Yes (attempted -- but limited d/t Cognitive decline and participation) Oral Cavity - Dentition: Adequate natural dentition Vision:  (n/a -- kept eyes closed often) Self-Feeding Abilities: Total assist (did not participate) Patient Positioning: Upright in bed (needed positioning) Baseline Vocal Quality: Low vocal intensity (muttered/mumbled speech at times) Volitional Cough: Cognitively unable to elicit Volitional Swallow: Unable to elicit    Oral/Motor/Sensory Function Overall Oral Motor/Sensory Function: Within functional limits   Ice Chips Ice chips: Not tested   Thin Liquid Thin Liquid: Impaired (oral prep confusion - min) Presentation: Straw;Cup (total assist; ~3ozs) Oral Phase Impairments: Poor awareness of bolus (oral prep confusion) Oral Phase Functional Implications:  (none) Pharyngeal  Phase Impairments:  (none)    Nectar Thick Nectar Thick Liquid: Not tested   Honey Thick Honey Thick Liquid: Not tested   Puree Puree: Within functional limits (grossly -- min oral prep confusion) Presentation: Spoon (fed; 10 trials)   Solid     Solid: Not tested        Orinda Kenner, MS, CCC-SLP Speech Language Pathologist Rehab Services [phone] [facility] 12/03/2020,12:44 PM

## 2020-12-03 NOTE — H&P (Addendum)
[place]   PATIENT NAME: Jasmine Colon    MR#:  [id number]  DATE OF BIRTH:  [birth year]  DATE OF ADMISSION:  12/02/2020  PRIMARY CARE PHYSICIAN: Dion Body, MD   Patient is coming from: Home  REQUESTING/REFERRING PHYSICIAN: Hulan Saas, MD  CHIEF COMPLAINT:   Chief Complaint  Patient presents with  . Abdominal Pain    HISTORY OF PRESENT ILLNESS:  Jasmine Colon is a 80 y.o. Caucasian female with medical history significant for asthma, dyslipidemia, Parkinson's disease, breast cancer and low back pain, who presented to the ER with acute onset of abdominal pain which has been worsening over the last 3 to 4 days.  She describes it as sharp and stabbing pain with associated nausea and dry heaves without vomiting.  She has been having diarrhea over the last couple days with foul-smelling stools.  No recent antibiotic intake.  No melena or bright red bleeding per rectum.  No fever or chills.  She had chest pain earlier during the day but in the ER she denied any chest pain or palpitations.  No reported dysuria, oliguria or hematuria or flank pain.  The patient was significantly somnolent during the interview after receiving 1 mg of IV Ativan twice.  ED Course: When she came to the ER blood pressure was 139/98 with a heart rate of 103 with pulse oximetry of 91% on room air and later 85% then 93% on 2 L of O2 by nasal cannula with otherwise normal vital signs.  Labs revealed a BUN of 48 with creatinine 1.07 with otherwise unremarkable CMP.  Lipase was 18.  Lactic acid 1.2.  High-sensitivity troponin I was 82 and CBC showed significant leukocytosis of 22.5, thrombocytosis and anemia.  Influenza antigens and COVID 19 came back negative.  The patient had a C. difficile antigen and PCR that came back positive with negative toxin.  GI pathogen came back negative.  Blood culture was sent.  UA showed elevated specific gravity and was otherwise unremarkable.  Imaging: Chest x-ray showed  no acute cardiopulmonary disease. CTA of the chest showed no evidence for PE and evidence of pulmonary artery hypertension.  It showed aortic atherosclerosis and emphysema.    Abdominal and pelvic CT scan revealed the following: Severely limited exam secondary to patient motion artifact.   Gaseous distension of the large and small bowel which may represent a generalized ileus. Correlate with the physical exam. Some retained fecal material is noted although no definitive constipation is seen. These changes may be related to pain medication given the recent hip surgery.   No other definitive abnormality is noted.  The patient was given 4 of aspirin, IV Rocephin, 1 L bolus of IV lactated Ringer and 1 mg of IV Ativan twice for restlessness.  She will be admitted to a medical monitored bed for further evaluation and management. PAST MEDICAL HISTORY:   Past Medical History:  Diagnosis Date  . Asthma 2012  . Hyperlipidemia   . Lesion of lateral popliteal nerve   . Lower back pain   . 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 Mitral valve disorder   . Osteoarthritis   . Osteoporosis   . Parkinson's disease ([place]) 2012  . Personal history of radiation therapy 2013   right breast ca with lumpectomy and rad tx    PAST SURGICAL HISTORY:   Past Surgical History:  Procedure  Laterality Date  . BREAST BIOPSY Right 10/26/2011   invasive mammary carcinoma and DCIS  . BREAST BIOPSY Left 01/09/2014   retroaerolar bx with Dr. Jamal Collin. Benign  . BREAST LUMPECTOMY Right 11/13/2011   invasive mammary ca, DCIS, LCIS in specimen. Clear margins.  LN negative  . BREAST SURGERY Right 2013   wide excision  . CESAREAN SECTION  31 years ago  . COLONOSCOPY  2008  . HIP ARTHROPLASTY Left 09/21/2020   Procedure: ARTHROPLASTY BIPOLAR HIP (HEMIARTHROPLASTY);  Surgeon: Earnestine Leys, MD;  Location: [facility] ORS;  Service:  Orthopedics;  Laterality: Left;  . HIP CLOSED REDUCTION Left 09/27/2020   Procedure: CLOSED REDUCTION HIP;  Surgeon: Renee Harder, MD;  Location: [facility] ORS;  Service: Orthopedics;  Laterality: Left;  . TOTAL HIP REVISION Left 10/07/2020   Procedure: TOTAL HIP REVISION;  Surgeon: Lovell Sheehan, MD;  Location: [facility] ORS;  Service: Orthopedics;  Laterality: Left;    SOCIAL HISTORY:   Social History   Tobacco Use  . Smoking status: Every Day    Packs/day: 0.50    Years: 45.00    Pack years: 22.50    Types: Cigarettes  . Smokeless tobacco: Never  Substance Use Topics  . Alcohol use: No    FAMILY HISTORY:   Family History  Problem Relation Age of Onset  . Stroke Mother   . Heart failure Father   . Heart disease Brother        bypass  . Cancer Maternal Aunt        breast   . Breast cancer Maternal Aunt   . Cancer Maternal Aunt        breast  . Breast cancer Maternal Aunt     DRUG ALLERGIES:   Allergies  Allergen Reactions  . Codeine     Feels weird  . Pseudoephedrine     Other reaction(s): Dizziness and giddiness (finding)    REVIEW OF SYSTEMS:   ROS As per history of present illness. All pertinent systems were reviewed above. Constitutional, HEENT, cardiovascular, respiratory, GI, GU, musculoskeletal, neuro, psychiatric, endocrine, integumentary and hematologic systems were reviewed and are otherwise negative/unremarkable except for positive findings mentioned above in the HPI.   MEDICATIONS AT HOME:   Prior to Admission medications   Medication Sig Start Date End Date Taking? Authorizing Provider  acetaminophen (TYLENOL) 650 MG CR tablet Take 650 mg by mouth every 8 (eight) hours as needed for pain.   Yes [provider]  Calcium Carbonate-Vitamin D (CALTRATE 600+D PO) Take 1 tablet by mouth daily.   Yes [provider]  carbidopa-levodopa (SINEMET IR) 25-100 MG tablet Take 1 tablet by mouth 6 (six) times daily. 09/24/20  Yes Fritzi Mandes, MD   cyanocobalamin 100 MCG tablet Take 100 mcg by mouth daily.   Yes [provider]  feeding supplement (ENSURE ENLIVE / ENSURE PLUS) LIQD Take 237 mLs by mouth 3 (three) times daily between meals. 09/29/20  Yes Danford, Suann Larry, MD  ferrous sulfate 325 (65 FE) MG tablet Take 1 tablet (325 mg total) by mouth daily with breakfast. 09/25/20  Yes Fritzi Mandes, MD  fluticasone [facility]) 50 MCG/ACT nasal spray Place 2 sprays into both nostrils daily as needed for allergies or rhinitis.   Yes [provider]  loratadine (CLARITIN) 10 MG tablet Take 10 mg by mouth daily as needed for allergies.   Yes [provider]  mirtazapine (REMERON) 30 MG tablet Take 30 mg by mouth at bedtime. 06/27/20  Yes [provider]  Multiple Vitamins-Minerals (PRESERVISION AREDS PO) Take 1 capsule by mouth daily.   Yes [provider]  rasagiline (AZILECT) 1 MG TABS tablet Take 1 tablet (1 mg total) by mouth daily. 09/25/20  Yes Fritzi Mandes, MD  sodium chloride (OCEAN) 0.65 % SOLN nasal spray Place 1 spray into both nostrils daily as needed (allergies).   Yes [provider]      VITAL SIGNS:  Blood pressure 90/61, pulse (!) 103, temperature 97.9 F (36.6 C), temperature source Oral, resp. rate 20, height '5\' 5"'$  (1.651 m), weight 49.9 kg, SpO2 97 %.  PHYSICAL EXAMINATION:  Physical Exam  GENERAL:  81 y.o.-year-old significantly somnolent and difficult to arouse Caucasian female patient lying in the bed with no acute distress.  EYES: Pupils equal, round, reactive to light and accommodation. No scleral icterus. Extraocular muscles intact.  HEENT: Head atraumatic, normocephalic. Oropharynx and nasopharynx clear.  NECK:  Supple, no jugular venous distention. No thyroid enlargement, no tenderness.  LUNGS: Normal breath sounds bilaterally, no wheezing, rales,rhonchi or crepitation. No use of accessory muscles of respiration.  CARDIOVASCULAR: Regular rate and rhythm, S1, S2  normal. No murmurs, rubs, or gallops.  ABDOMEN: Soft, nondistended, with minimal generalized tenderness without rebound tenderness guarding or rigidity.  Bowel sounds present. No organomegaly or mass.  EXTREMITIES: No pedal edema, cyanosis, or clubbing.  NEUROLOGIC: Cranial nerves II through XII are intact. Muscle strength 5/5 in all extremities. Sensation intact. Gait not checked.  PSYCHIATRIC: The patient is somnolent and difficult to arouse after 2 mg of IV Ativan. SKIN: No obvious rash, lesion, or ulcer.   LABORATORY PANEL:   CBC Recent Labs  Lab 12/02/20 1523  WBC 22.5*  HGB 10.3*  HCT 30.1*  PLT 483*   ------------------------------------------------------------------------------------------------------------------  Chemistries  Recent Labs  Lab 12/02/20 1523  NA 136  K 3.8  CL 99  CO2 27  GLUCOSE 114*  BUN 48*  CREATININE 1.07*  CALCIUM 9.6  MG 2.1  AST 15  ALT <5  ALKPHOS 117  BILITOT 1.1   ------------------------------------------------------------------------------------------------------------------  Cardiac Enzymes No results for input(s): TROPONINI in the last 168 hours. ------------------------------------------------------------------------------------------------------------------  RADIOLOGY:  DG Chest 2 View  Result Date: 12/02/2020 CLINICAL DATA:  Hypoxia. EXAM: CHEST - 2 VIEW COMPARISON:  10/03/2020, lung bases from abdominal CT earlier today also reviewed FINDINGS: Normal heart size. Mild aortic atherosclerosis and tortuosity. The lungs are clear. Pulmonary vasculature is normal. No consolidation, pleural effusion, or pneumothorax. The bones are diffusely under mineralized. No acute osseous abnormalities are seen. Gaseous distention of bowel in the upper abdomen as seen on CT earlier today. IMPRESSION: No acute chest finding. Electronically Signed   By: Keith Rake M.D.   On: 12/02/2020 22:59   CT Angio Chest PE W/Cm &/Or Wo Cm  Result  Date: 12/03/2020 CLINICAL DATA:  Abdominal pain, status post hip replacement, Parkinson's disease EXAM: CT ANGIOGRAPHY CHEST WITH CONTRAST TECHNIQUE: Multidetector CT imaging of the chest was performed using the standard protocol during bolus administration of intravenous contrast. Multiplanar CT image reconstructions and MIPs were obtained to evaluate the vascular anatomy. CONTRAST:  97m OMNIPAQUE IOHEXOL 350 MG/ML SOLN COMPARISON:  Chest radiograph dated 12/02/2020 FINDINGS: Motion degraded images. Cardiovascular: Satisfactory opacification the bilateral pulmonary arteries to the segmental level. No evidence of pulmonary embolism. Enlargement of the main pulmonary artery, suggesting pulmonary arterial hypertension. Although not tailored for evaluation of the thoracic aorta, there is no evidence of thoracic aortic aneurysm or dissection. The heart is normal in size.  No pericardial effusion. Mediastinum/Nodes: No suspicious mediastinal lymphadenopathy. Visualized thyroid is unremarkable. Lungs/Pleura: Evaluation lung parenchyma is constrained by respiratory motion. Within that constraint, there are no suspicious pulmonary nodules. Mild dependent atelectasis in the bilateral lower lobes. No focal consolidation. Mild centrilobular and paraseptal emphysematous changes. No pleural effusion or pneumothorax. Upper Abdomen: Evaluated on recent CT abdomen/pelvis. Musculoskeletal: Old right lateral 5th and 6th rib fracture deformities. Visualized osseous structures are otherwise within normal limits. Review of the MIP images confirms the above findings. IMPRESSION: Motion degraded images. No evidence of pulmonary embolism. Enlargement of the main pulmonary artery, suggesting pulmonary arterial hypertension. Aortic Atherosclerosis (ICD10-I70.0) and Emphysema (ICD10-J43.9). Electronically Signed   By: Julian Hy M.D.   On: 12/03/2020 01:03   CT Abdomen Pelvis W Contrast  Result Date: 12/02/2020 CLINICAL DATA:   Abdominal bloating with diarrhea EXAM: CT ABDOMEN AND PELVIS WITH CONTRAST TECHNIQUE: Multidetector CT imaging of the abdomen and pelvis was performed using the standard protocol following bolus administration of intravenous contrast. Examination is significantly limited secondary to patient motion artifact related to the known Parkinson's disease. CONTRAST:  31m OMNIPAQUE IOHEXOL 350 MG/ML SOLN COMPARISON:  None. FINDINGS: Lower chest: No acute abnormality. Hepatobiliary: No focal liver abnormality is seen. No gallstones, gallbladder wall thickening, or biliary dilatation. Pancreas: Unremarkable. No pancreatic ductal dilatation or surrounding inflammatory changes. Spleen: Normal in size without focal abnormality. Adrenals/Urinary Tract: Adrenal glands are within normal limits. Kidneys demonstrate a normal enhancement pattern bilaterally. No renal calculi or obstructive changes are noted. The bladder is partially distended. Stomach/Bowel: Fecal material is noted throughout the colon. This is consistent with a degree of mild constipation. Multiple air distended loops of small bowel are seen as well no definitive transition zone is noted. This may represent a generalized small bowel ileus. Correlate with physical exam. The stomach is unremarkable with the exception of a small sliding-type hiatal hernia. Vascular/Lymphatic: Aortic atherosclerosis. No enlarged abdominal or pelvic lymph nodes. Reproductive: Uterus is not well visualized. No pelvic mass is seen. Evaluation is limited due to patient motion artifact and scatter artifact from the left hip replacement. Other: No abdominal wall hernia or abnormality. No abdominopelvic ascites. Musculoskeletal: Status post left hip replacement. Degenerative changes of lumbar spine are noted. No acute bony abnormality is seen. Chronic scoliosis is noted. IMPRESSION: Severely limited exam secondary to patient motion artifact. Gaseous distension of the large and small bowel which  may represent a generalized ileus. Correlate with the physical exam. Some retained fecal material is noted although no definitive constipation is seen. These changes may be related to pain medication given the recent hip surgery. No other definitive abnormality is noted. Electronically Signed   By: MInez CatalinaM.D.   On: 12/02/2020 22:49      IMPRESSION AND PLAN:  Active Problems:   Infectious gastroenteritis  1.  Acute C. difficile colitis with subsequent sepsis.  Sepsis is manifested by leukocytosis and tachypnea as well as tachycardia.  She may be meeting severe sepsis criteria based on her acute hypoxemic respiratory failure. - The patient will be admitted to a medical monitored bed. - We will continue management with p.o. vancomycin and IV Flagyl. - Pain management will be provided. - Chest CTA was unremarkable.  2.  Dehydration with azotemia. - She will be hydrated with IV normal saline and will follow her BMP.  3.  Elevated troponin I. - This likely secondary to demand ischemia.  The patient was having chest pain earlier however before she came to the ER.  She was chest pain-free in the ER. - We will follow serial troponin I's and place her cautiously on aspirin. - We will obtain serial EKGs. - Should be placed on as needed sublingual nitroglycerin and morphine sulfate for pain. - Cardiology consult will be obtained. - Dr. Nehemiah Massed advised about the patient.  4.  Parkinson's disease. - We will continue with Sinemet and Azilect.  5.  Asthma. - She has no current exacerbation. -She will be placed on as needed DuoNebs.   DVT prophylaxis: Lovenox.  Code Status: full code.  Family Communication:  The plan of care was discussed in details with the patient (and family). I answered all questions. The patient agreed to proceed with the above mentioned plan. Further management will depend upon hospital course. Disposition Plan: Back to previous home environment Consults called:  none.  All the records are reviewed and case discussed with ED provider.  Status is: Inpatient  Remains inpatient appropriate because:Ongoing active pain requiring inpatient pain management, Ongoing diagnostic testing needed not appropriate for outpatient work up, Unsafe d/c plan, IV treatments appropriate due to intensity of illness or inability to take PO, and Inpatient level of care appropriate due to severity of illness  Dispo: The patient is from: Home              Anticipated d/c is to: Home              Patient currently is not medically stable to d/c.   Difficult to place patient No   TOTAL TIME TAKING CARE OF THIS PATIENT: 55 minutes.    Christel Mormon M.D on 12/03/2020 at 2:04 AM  [facility]   From 7 PM-7 AM, contact night-coverage [email or web address]  CC: Primary care physician; Dion Body, MD

## 2020-12-03 NOTE — Consult Note (Addendum)
[facility] Cardiology Consultation Note  Patient ID: Jasmine Colon, MRN: [id number], DOB/AGE: 09-26-1940 80 y.o. Admit date: 12/02/2020   Date of Consult: 12/03/2020 Primary Physician: Dion Body, MD Primary Cardiologist: none  Chief Complaint:  Chief Complaint  Patient presents with   Abdominal Pain   Reason for Consult:Sepsis with elevated troponin and previous chest pain  HPI: 80 y.o. female with a past medical history of asthma, dyslipidemia, Parkinson's disease, breast cancer who presented to the ER on 12/02/2020 with complaints of abdominal pain associated with dry heaves without vomiting as well as diarrhea with foul-smelling stools.  Patient had reported an episode of chest pain prior to her presentation at the ER which had resolved before ER evaluation.  Patient had a positive C. difficile antigen and PCR, chest x-ray negative for cardiopulmonary disease, CTA with no evidence of PE or pulmonary artery hypertension.  Patient is currently mildly hypotensive, tachycardic, tachypneic with acute hypoxic respiratory failure was found to have an elevated troponin 1 of 82.  Patient has risk factors for cardiovascular disease which include hyperlipidemia, aortic atherosclerosis noted by CTA, age.   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 Hormone positive, HER 2 no amplified   Mitral valve disorder    Osteoarthritis    Osteoporosis    Parkinson's disease ([place]) 2012   Personal history of radiation therapy 2013   right breast ca with lumpectomy and rad tx      Surgical History:  Past Surgical History:  Procedure Laterality Date   BREAST BIOPSY Right 10/26/2011   invasive mammary carcinoma and DCIS   BREAST BIOPSY Left 01/09/2014   retroaerolar bx with Dr. Jamal Collin. Benign   BREAST LUMPECTOMY Right  11/13/2011   invasive mammary ca, DCIS, LCIS in specimen. Clear margins.  LN negative   BREAST SURGERY Right 2013   wide excision   CESAREAN SECTION  31 years ago   COLONOSCOPY  2008   HIP ARTHROPLASTY Left 09/21/2020   Procedure: ARTHROPLASTY BIPOLAR HIP (HEMIARTHROPLASTY);  Surgeon: Earnestine Leys, MD;  Location: [facility] ORS;  Service: Orthopedics;  Laterality: Left;   HIP CLOSED REDUCTION Left 09/27/2020   Procedure: CLOSED REDUCTION HIP;  Surgeon: Renee Harder, MD;  Location: [facility] ORS;  Service: Orthopedics;  Laterality: Left;   TOTAL HIP REVISION Left 10/07/2020   Procedure: TOTAL HIP REVISION;  Surgeon: Lovell Sheehan, MD;  Location: [facility] ORS;  Service: Orthopedics;  Laterality: Left;     Home Meds: Prior to Admission medications   Medication Sig Start Date End Date Taking? Authorizing Provider  acetaminophen (TYLENOL) 650 MG CR tablet Take 650 mg by mouth every 8 (eight) hours as needed for pain.   Yes [provider]  Calcium Carbonate-Vitamin D (CALTRATE 600+D PO) Take 1 tablet by mouth daily.   Yes [provider]  carbidopa-levodopa (SINEMET IR) 25-100 MG tablet Take 1 tablet by mouth 6 (six) times daily. 09/24/20  Yes Fritzi Mandes, MD  cyanocobalamin 100 MCG tablet Take 100 mcg by mouth daily.   Yes [provider]  feeding supplement (ENSURE ENLIVE / ENSURE PLUS) LIQD Take 237 mLs by mouth 3 (three) times daily between meals. 09/29/20  Yes Danford, Suann Larry, MD  ferrous sulfate 325 (65 FE) MG tablet Take 1 tablet (325 mg total)  by mouth daily with breakfast. 09/25/20  Yes Fritzi Mandes, MD  fluticasone [facility]) 50 MCG/ACT nasal spray Place 2 sprays into both nostrils daily as needed for allergies or rhinitis.   Yes [provider]  loratadine (CLARITIN) 10 MG tablet Take 10 mg by mouth daily as needed for allergies.   Yes [provider]  mirtazapine (REMERON) 30 MG tablet Take 30 mg by mouth at bedtime. 06/27/20  Yes [provider]  Multiple Vitamins-Minerals (PRESERVISION AREDS PO) Take 1 capsule by mouth daily.   Yes [provider]  rasagiline (AZILECT) 1 MG TABS tablet Take 1 tablet (1 mg total) by mouth daily. 09/25/20  Yes Fritzi Mandes, MD  sodium chloride (OCEAN) 0.65 % SOLN nasal spray Place 1 spray into both nostrils daily as needed (allergies).   Yes [provider]    Inpatient Medications:   aspirin EC  81 mg Oral Daily   carbidopa-levodopa  1 tablet Oral 6 X Daily   enoxaparin (LOVENOX) injection  40 mg Subcutaneous Q24H   feeding supplement  237 mL Oral TID BM   ferrous sulfate  325 mg Oral Q breakfast   mirtazapine  30 mg Oral QHS   multivitamin-lutein  1 capsule Oral Daily   rasagiline  1 mg Oral Daily   cyanocobalamin  100 mcg Oral Daily    sodium chloride 100 mL/hr at 12/03/20 0258   cefTRIAXone (ROCEPHIN)  IV     metronidazole Stopped (12/03/20 0518)    Allergies:  Allergies  Allergen Reactions   Codeine     Feels weird   Pseudoephedrine     Other reaction(s): Dizziness and giddiness (finding)    Social History   Socioeconomic History   Marital status: Married    Spouse name: Jori Moll   Number of children: 2   Years of education: college   Highest education level: Not on file  Occupational History   Occupation: part time    Comment: Product manager day week,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  Tobacco Use   Smoking status: Every Day    Packs/day: 0.50    Years: 45.00    Pack years: 22.50    Types: Cigarettes   Smokeless tobacco: Never  Substance and Sexual Activity   Alcohol use: No   Drug use: No   Sexual activity: Not on file  Other Topics Concern   Not on file  Social History Narrative   Patient is right handed, resides with husband   Social Determinants of Radio broadcast assistant Strain: Not on file  Food Insecurity: Not on file  Transportation Needs: Not on file  Physical Activity: Not on file  Stress: Not on file  Social  Connections: Not on file  Intimate Partner Violence: Not on file     Family History  Problem Relation Age of Onset   Stroke Mother    Heart failure Father    Heart disease Brother        bypass   Cancer Maternal Aunt        breast    Breast cancer Maternal Aunt    Cancer Maternal Aunt        breast   Breast cancer Maternal Aunt      Review of Systems Patient unable to participate in interview due to significant somnolence due to a combination of current illness, sepsis, administration of Ativan  Labs: No results for input(s): CKTOTAL, CKMB, TROPONINI in the last 72 hours. Lab Results  Component Value Date  WBC 8.4 12/03/2020   HGB 10.3 (L) 12/03/2020   HCT 33.1 (L) 12/03/2020   MCV 90.9 12/03/2020   PLT 415 (H) 12/03/2020    Recent Labs  Lab 12/02/20 1523 12/03/20 0742  NA 136 137  K 3.8 3.6  CL 99 101  CO2 27 24  BUN 48* 46*  CREATININE 1.07* 1.06*  CALCIUM 9.6 9.0  PROT 6.6  --   BILITOT 1.1  --   ALKPHOS 117  --   ALT <5  --   AST 15  --   GLUCOSE 114* 98   Lab Results  Component Value Date   CHOL 177 09/21/2020   HDL 73 09/21/2020   LDLCALC 93 09/21/2020   TRIG 57 09/21/2020   No results found for: DDIMER  Radiology/Studies:  DG Chest 2 View  Result Date: 12/02/2020 CLINICAL DATA:  Hypoxia. EXAM: CHEST - 2 VIEW COMPARISON:  10/03/2020, lung bases from abdominal CT earlier today also reviewed FINDINGS: Normal heart size. Mild aortic atherosclerosis and tortuosity. The lungs are clear. Pulmonary vasculature is normal. No consolidation, pleural effusion, or pneumothorax. The bones are diffusely under mineralized. No acute osseous abnormalities are seen. Gaseous distention of bowel in the upper abdomen as seen on CT earlier today. IMPRESSION: No acute chest finding. Electronically Signed   By: Keith Rake M.D.   On: 12/02/2020 22:59   CT Angio Chest PE W/Cm &/Or Wo Cm  Result Date: 12/03/2020 CLINICAL DATA:  Abdominal pain, status post hip  replacement, Parkinson's disease EXAM: CT ANGIOGRAPHY CHEST WITH CONTRAST TECHNIQUE: Multidetector CT imaging of the chest was performed using the standard protocol during bolus administration of intravenous contrast. Multiplanar CT image reconstructions and MIPs were obtained to evaluate the vascular anatomy. CONTRAST:  70m OMNIPAQUE IOHEXOL 350 MG/ML SOLN COMPARISON:  Chest radiograph dated 12/02/2020 FINDINGS: Motion degraded images. Cardiovascular: Satisfactory opacification the bilateral pulmonary arteries to the segmental level. No evidence of pulmonary embolism. Enlargement of the main pulmonary artery, suggesting pulmonary arterial hypertension. Although not tailored for evaluation of the thoracic aorta, there is no evidence of thoracic aortic aneurysm or dissection. The heart is normal in size.  No pericardial effusion. Mediastinum/Nodes: No suspicious mediastinal lymphadenopathy. Visualized thyroid is unremarkable. Lungs/Pleura: Evaluation lung parenchyma is constrained by respiratory motion. Within that constraint, there are no suspicious pulmonary nodules. Mild dependent atelectasis in the bilateral lower lobes. No focal consolidation. Mild centrilobular and paraseptal emphysematous changes. No pleural effusion or pneumothorax. Upper Abdomen: Evaluated on recent CT abdomen/pelvis. Musculoskeletal: Old right lateral 5th and 6th rib fracture deformities. Visualized osseous structures are otherwise within normal limits. Review of the MIP images confirms the above findings. IMPRESSION: Motion degraded images. No evidence of pulmonary embolism. Enlargement of the main pulmonary artery, suggesting pulmonary arterial hypertension. Aortic Atherosclerosis (ICD10-I70.0) and Emphysema (ICD10-J43.9). Electronically Signed   By: SJulian HyM.D.   On: 12/03/2020 01:03   CT Abdomen Pelvis W Contrast  Result Date: 12/02/2020 CLINICAL DATA:  Abdominal bloating with diarrhea EXAM: CT ABDOMEN AND PELVIS WITH  CONTRAST TECHNIQUE: Multidetector CT imaging of the abdomen and pelvis was performed using the standard protocol following bolus administration of intravenous contrast. Examination is significantly limited secondary to patient motion artifact related to the known Parkinson's disease. CONTRAST:  841mOMNIPAQUE IOHEXOL 350 MG/ML SOLN COMPARISON:  None. FINDINGS: Lower chest: No acute abnormality. Hepatobiliary: No focal liver abnormality is seen. No gallstones, gallbladder wall thickening, or biliary dilatation. Pancreas: Unremarkable. No pancreatic ductal dilatation or surrounding inflammatory changes. Spleen: Normal in  size without focal abnormality. Adrenals/Urinary Tract: Adrenal glands are within normal limits. Kidneys demonstrate a normal enhancement pattern bilaterally. No renal calculi or obstructive changes are noted. The bladder is partially distended. Stomach/Bowel: Fecal material is noted throughout the colon. This is consistent with a degree of mild constipation. Multiple air distended loops of small bowel are seen as well no definitive transition zone is noted. This may represent a generalized small bowel ileus. Correlate with physical exam. The stomach is unremarkable with the exception of a small sliding-type hiatal hernia. Vascular/Lymphatic: Aortic atherosclerosis. No enlarged abdominal or pelvic lymph nodes. Reproductive: Uterus is not well visualized. No pelvic mass is seen. Evaluation is limited due to patient motion artifact and scatter artifact from the left hip replacement. Other: No abdominal wall hernia or abnormality. No abdominopelvic ascites. Musculoskeletal: Status post left hip replacement. Degenerative changes of lumbar spine are noted. No acute bony abnormality is seen. Chronic scoliosis is noted. IMPRESSION: Severely limited exam secondary to patient motion artifact. Gaseous distension of the large and small bowel which may represent a generalized ileus. Correlate with the physical  exam. Some retained fecal material is noted although no definitive constipation is seen. These changes may be related to pain medication given the recent hip surgery. No other definitive abnormality is noted. Electronically Signed   By: Inez Catalina M.D.   On: 12/02/2020 22:49    EKG: Normal sinus rhythm at a ventricular rate of 96 bpm.  Weights: Filed Weights   12/02/20 1458  Weight: 49.9 kg     Physical Exam: Blood pressure 113/72, pulse 88, temperature 97.9 F (36.6 C), temperature source Oral, resp. rate 17, height '5\' 5"'$  (1.651 m), weight 49.9 kg, SpO2 100 %. Body mass index is 18.3 kg/m. General: Significantly somnolent and difficult to arouse, no acute distress Head eyes ears nose throat: Normocephalic, atraumatic, sclera non-icteric, no xanthomas, nares are without discharge. No apparent thyromegaly and/or mass  Lungs: Normal respiratory effort.  no wheezes, no rales, no rhonchi.  Heart: RRR with normal S1 S2. no murmur gallop, no rub, PMI is normal size and placement, carotid upstroke normal without bruit, jugular venous pressure is normal Abdomen: Soft, non-tender, non-distended with normoactive bowel sounds. No hepatomegaly. No rebound/guarding. No obvious abdominal masses.  Extremities: No edema. no cyanosis, no clubbing, no ulcers  Peripheral : 2+ bilateral upper extremity pulses, 2+ bilateral femoral pulses, 2+ bilateral dorsal pedal pulse Musculoskeletal: Normal muscle tone without kyphosis Psych: Somnolent and difficult to arouse    Assessment: 1.  Acute C. difficile colitis with sepsis and acute hypoxemic respiratory failure without evidence of infarction or chf 2.  Elevated troponin I most likely secondary to demand ischemia  Plan: -Continue to follow serial troponin I's -Continue daily low-dose aspirin -Sublingual nitroglycerin and morphine sulfate as needed for chest pain symptoms -consider echo for above risk assessment -continue supportive  care  Signed, Jettie Booze PA-C [facility] Card[facility] 12/03/2020, 8:38 AM The patient has been interviewed and examined. I agree with assessment and plan above. Serafina Royals MD [facility]

## 2020-12-03 NOTE — ED Notes (Signed)
Pt family notified and updated.

## 2020-12-03 NOTE — Progress Notes (Addendum)
PROGRESS NOTE    [name]  [id number] DOB: 07/04/1940 DOA: 12/02/2020 PCP: Dion Body, MD      Brief Narrative:  Jasmine Colon is a 80 y.o. 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  In the ER she was tachycardic, hypoxic to 85%, leukocytosis 20 2K, and C. difficile PCR positive.  CTA of the chest showed no cause of hypoxia.  In the abdomen showed possible generalized ileus.  She was started on antibiotics and admitted for C. difficile sepsis.  .  Assessment & Plan:  Severe sepsis due to C. Difficile Presents with tachycardia, leukocytosis, decreased mentation/encephalopathy, and hypoxia without explanation in the setting of C. difficile diarrhea. - Stop Rocephin - Start oral vancomycin with Flagyl IV for severe C. difficile with possible ileus   Possible ileus Resting stool, but dilated bowel on imaging. - Serial x-ray  Acute metabolic encephaloapthy Due to sepsis  Parkinson's disease - Continue rasagiline, Sinemet  Severe protein calorie malnutrition As evidenced by 3% weight loss in last 2 months, poor PO intake and severe generalized loss of subcutaneous muscle mass and fat. - Continue mirtazapine  Recent acute blood loss anemia During recent hospitalization.  Hgb now stable relative to recent baseline - Continue iron  Dehydration without AKI Cr 1.08 from baseline 0.8, BUN elevated - Continue IV fluids  Pressure injury Present on admission             Remains inpatient appropriate because:Altered mental status and IV treatments appropriate due to intensity of illness or inability to take PO  Dispo: The patient is from: SNF              Anticipated d/c is to: SNF               Patient currently is not medically stable to d/c.   Difficult to place patient No              MDM: This is a no charge note.  For further details, please see H&P by my partner Dr. Sidney Ace from earlier today.  The below labs and imaging reports were reviewed and summarized above.    DVT prophylaxis: enoxaparin (LOVENOX) injection 40 mg Start: 12/03/20 2200  Code Status: FULL     Consultants:    Procedures:  8/15 CT abdomen and pelvis -- ileus, no other focal findings 8/15 CTA chest -- no PE, no pneumonia  Antimicrobials:  Rocephin x1 8/15 Flagyl 8/15 >> PO vancomycin 8/16 >>           Subjective: The patient is encephalopathic.  Nursing of documented no respiratory distress, no fever, no vomiting.        Objective: Vitals:   12/03/20 0730 12/03/20 0800 12/03/20 0830 12/03/20 1230  BP: 111/68 106/67 105/67 113/73  Pulse: 88 88 91 (!) 110  Resp:   18 20  Temp:      TempSrc:      SpO2: 100% 100% 100% 100%  Weight:      Height:       No intake or output data in the 24 hours ending 12/03/20 1311 Filed Weights   12/02/20 1458  Weight: 49.9 kg    Examination: The patient was seen and examined.      Data Reviewed: I have personally reviewed following labs and  imaging studies:  CBC: Recent Labs  Lab 12/02/20 1523 12/03/20 0742  WBC 22.5* 8.4  HGB 10.3* 10.3*  HCT 30.1* 33.1*  MCV 86.2 90.9  PLT 483* [id number]*   Basic Metabolic Panel: Recent Labs  Lab 12/02/20 1523 12/03/20 0742  NA 136 137  K 3.8 3.6  CL 99 101  CO2 27 24  GLUCOSE 114* 98  BUN 48* 46*  CREATININE 1.07* 1.06*  CALCIUM 9.6 9.0  MG 2.1  --    GFR: Estimated Creatinine Clearance: 35 mL/min (A) (by C-G formula based on SCr of 1.06 mg/dL (H)). Liver Function Tests: Recent Labs  Lab 12/02/20 1523  AST 15  ALT <5  ALKPHOS 117  BILITOT 1.1  PROT 6.6  ALBUMIN 2.9*   Recent Labs  Lab 12/02/20 1523  LIPASE 18   No results for input(s): AMMONIA in the last  168 hours. Coagulation Profile: Recent Labs  Lab 12/03/20 0742  INR 1.1   Cardiac Enzymes: No results for input(s): CKTOTAL, CKMB, CKMBINDEX, TROPONINI in the last 168 hours. BNP (last 3 results) No results for input(s): PROBNP in the last 8760 hours. HbA1C: No results for input(s): HGBA1C in the last 72 hours. CBG: No results for input(s): GLUCAP in the last 168 hours. Lipid Profile: No results for input(s): CHOL, HDL, LDLCALC, TRIG, CHOLHDL, LDLDIRECT in the last 72 hours. Thyroid Function Tests: No results for input(s): TSH, T4TOTAL, FREET4, T3FREE, THYROIDAB in the last 72 hours. Anemia Panel: No results for input(s): VITAMINB12, FOLATE, FERRITIN, TIBC, IRON, RETICCTPCT in the last 72 hours. Urine analysis:    Component Value Date/Time   COLORURINE AMBER (A) 12/03/2020 0117   APPEARANCEUR HAZY (A) 12/03/2020 0117   LABSPEC >1.046 (H) 12/03/2020 0117   PHURINE 5.0 12/03/2020 0117   GLUCOSEU NEGATIVE 12/03/2020 0117   HGBUR NEGATIVE 12/03/2020 0117   BILIRUBINUR NEGATIVE 12/03/2020 0117   KETONESUR 5 (A) 12/03/2020 0117   PROTEINUR NEGATIVE 12/03/2020 0117   NITRITE NEGATIVE 12/03/2020 0117   LEUKOCYTESUR NEGATIVE 12/03/2020 0117   Sepsis Labs: '@LABRCNTIP'$ (procalcitonin:4,lacticacidven:4)  ) Recent Results (from the past 240 hour(s))  Resp Panel by RT-PCR (Flu A&B, Covid) Nasopharyngeal Swab     Status: None   Collection Time: 12/02/20 10:22 PM   Specimen: Nasopharyngeal Swab; Nasopharyngeal(NP) swabs in vial transport medium  Result Value Ref Range Status   SARS Coronavirus 2 by RT PCR NEGATIVE NEGATIVE Final    Comment: (NOTE) SARS-CoV-2 target nucleic acids are NOT DETECTED.  The SARS-CoV-2 RNA is generally detectable in upper respiratory specimens during the acute phase of infection. The lowest concentration of SARS-CoV-2 viral copies this assay can detect is 138 copies/mL. A negative result does not preclude SARS-Cov-2 infection and should not be used as the  sole basis for treatment or other patient management decisions. A negative result may occur with  improper specimen collection/handling, submission of specimen other than nasopharyngeal swab, presence of viral mutation(s) within the areas targeted by this assay, and inadequate number of viral copies(<138 copies/mL). A negative result must be combined with clinical observations, patient history, and epidemiological information. The expected result is Negative.  Fact Sheet for Patients:  EntrepreneurPulse.com.au  Fact Sheet for Healthcare Providers:  IncredibleEmployment.be  This test is no t yet approved or cleared by the [place] FDA and  has been authorized for detection and/or diagnosis of SARS-CoV-2 by FDA under an Emergency Use Authorization (EUA). This EUA will remain  in effect (meaning this test can be used) for the duration of the  COVID-19 declaration under Section 564(b)(1) of the Act, 21 U.S.C.section 360bbb-3(b)(1), unless the authorization is terminated  or revoked sooner.       Influenza A by PCR NEGATIVE NEGATIVE Final   Influenza B by PCR NEGATIVE NEGATIVE Final    Comment: (NOTE) The Xpert Xpress SARS-CoV-2/FLU/RSV plus assay is intended as an aid in the diagnosis of influenza from Nasopharyngeal swab specimens and should not be used as a sole basis for treatment. Nasal washings and aspirates are unacceptable for Xpert Xpress SARS-CoV-2/FLU/RSV testing.  Fact Sheet for Patients: EntrepreneurPulse.com.au  Fact Sheet for Healthcare Providers: IncredibleEmployment.be  This test is not yet approved or cleared by the [place] FDA and has been authorized for detection and/or diagnosis of SARS-CoV-2 by FDA under an Emergency Use Authorization (EUA). This EUA will remain in effect (meaning this test can be used) for the duration of the COVID-19 declaration under Section 564(b)(1) of the  Act, 21 U.S.C. section 360bbb-3(b)(1), unless the authorization is terminated or revoked.  Performed at [facility], [address] [place], [place] [address]   Blood culture (routine single)     Status: None (Preliminary result)   Collection Time: 12/03/20 12:07 AM   Specimen: BLOOD  Result Value Ref Range Status   Specimen Description BLOOD LEFT FA  Final   Special Requests   Final    BOTTLES DRAWN AEROBIC AND ANAEROBIC Blood Culture adequate volume   Culture   Final    NO GROWTH < 12 HOURS Performed at [facility], [address] [place], [place] [address]    Report Status PENDING  Incomplete  Gastrointestinal Panel by PCR , Stool     Status: None   Collection Time: 12/03/20  1:12 AM   Specimen: Stool  Result Value Ref Range Status   Campylobacter species NOT DETECTED NOT DETECTED Final   Plesimonas shigelloides NOT DETECTED NOT DETECTED Final   Salmonella species NOT DETECTED NOT DETECTED Final   Yersinia enterocolitica NOT DETECTED NOT DETECTED Final   Vibrio species NOT DETECTED NOT DETECTED Final   Vibrio cholerae NOT DETECTED NOT DETECTED Final   Enteroaggregative E coli (EAEC) NOT DETECTED NOT DETECTED Final   Enteropathogenic E coli (EPEC) NOT DETECTED NOT DETECTED Final   Enterotoxigenic E coli (ETEC) NOT DETECTED NOT DETECTED Final   Shiga like toxin producing E coli (STEC) NOT DETECTED NOT DETECTED Final   Shigella/Enteroinvasive E coli (EIEC) NOT DETECTED NOT DETECTED Final   Cryptosporidium NOT DETECTED NOT DETECTED Final   Cyclospora cayetanensis NOT DETECTED NOT DETECTED Final   Entamoeba histolytica NOT DETECTED NOT DETECTED Final   Giardia lamblia NOT DETECTED NOT DETECTED Final   Adenovirus F40/41 NOT DETECTED NOT DETECTED Final   Astrovirus NOT DETECTED NOT DETECTED Final   Norovirus GI/GII NOT DETECTED NOT DETECTED Final   Rotavirus A NOT DETECTED NOT DETECTED Final   Sapovirus (I, II, IV, and V) NOT DETECTED NOT DETECTED Final     Comment: Performed at [facility], [address] [place], [place] [address]  C Difficile Quick Screen w PCR reflex     Status: Abnormal   Collection Time: 12/03/20  1:12 AM   Specimen: Stool  Result Value Ref Range Status   C Diff antigen POSITIVE (A) NEGATIVE Final   C Diff toxin NEGATIVE NEGATIVE Final   C Diff interpretation Results are indeterminate. See PCR results.  Final    Comment: Performed at [facility], [address] [place], [place] [address]  C.  Diff by PCR, Reflexed     Status: Abnormal   Collection Time: 12/03/20  1:12 AM  Result Value Ref Range Status   Toxigenic C. Difficile by PCR POSITIVE (A) NEGATIVE Final    Comment: Positive for toxigenic C. difficile with little to no toxin production. Only treat if clinical presentation suggests symptomatic illness. Performed at [facility], [address], [place], [place] [address]          Radiology Studies: DG Chest 2 View  Result Date: 12/02/2020 CLINICAL DATA:  Hypoxia. EXAM: CHEST - 2 VIEW COMPARISON:  10/03/2020, lung bases from abdominal CT earlier today also reviewed FINDINGS: Normal heart size. Mild aortic atherosclerosis and tortuosity. The lungs are clear. Pulmonary vasculature is normal. No consolidation, pleural effusion, or pneumothorax. The bones are diffusely under mineralized. No acute osseous abnormalities are seen. Gaseous distention of bowel in the upper abdomen as seen on CT earlier today. IMPRESSION: No acute chest finding. Electronically Signed   By: Keith Rake M.D.   On: 12/02/2020 22:59   CT Angio Chest PE W/Cm &/Or Wo Cm  Result Date: 12/03/2020 CLINICAL DATA:  Abdominal pain, status post hip replacement, Parkinson's disease EXAM: CT ANGIOGRAPHY CHEST WITH CONTRAST TECHNIQUE: Multidetector CT imaging of the chest was performed using the standard protocol during bolus administration of intravenous contrast. Multiplanar CT image reconstructions and MIPs were  obtained to evaluate the vascular anatomy. CONTRAST:  70m OMNIPAQUE IOHEXOL 350 MG/ML SOLN COMPARISON:  Chest radiograph dated 12/02/2020 FINDINGS: Motion degraded images. Cardiovascular: Satisfactory opacification the bilateral pulmonary arteries to the segmental level. No evidence of pulmonary embolism. Enlargement of the main pulmonary artery, suggesting pulmonary arterial hypertension. Although not tailored for evaluation of the thoracic aorta, there is no evidence of thoracic aortic aneurysm or dissection. The heart is normal in size.  No pericardial effusion. Mediastinum/Nodes: No suspicious mediastinal lymphadenopathy. Visualized thyroid is unremarkable. Lungs/Pleura: Evaluation lung parenchyma is constrained by respiratory motion. Within that constraint, there are no suspicious pulmonary nodules. Mild dependent atelectasis in the bilateral lower lobes. No focal consolidation. Mild centrilobular and paraseptal emphysematous changes. No pleural effusion or pneumothorax. Upper Abdomen: Evaluated on recent CT abdomen/pelvis. Musculoskeletal: Old right lateral 5th and 6th rib fracture deformities. Visualized osseous structures are otherwise within normal limits. Review of the MIP images confirms the above findings. IMPRESSION: Motion degraded images. No evidence of pulmonary embolism. Enlargement of the main pulmonary artery, suggesting pulmonary arterial hypertension. Aortic Atherosclerosis (ICD10-I70.0) and Emphysema (ICD10-J43.9). Electronically Signed   By: SJulian HyM.D.   On: 12/03/2020 01:03   CT Abdomen Pelvis W Contrast  Result Date: 12/02/2020 CLINICAL DATA:  Abdominal bloating with diarrhea EXAM: CT ABDOMEN AND PELVIS WITH CONTRAST TECHNIQUE: Multidetector CT imaging of the abdomen and pelvis was performed using the standard protocol following bolus administration of intravenous contrast. Examination is significantly limited secondary to patient motion artifact related to the known  Parkinson's disease. CONTRAST:  867mOMNIPAQUE IOHEXOL 350 MG/ML SOLN COMPARISON:  None. FINDINGS: Lower chest: No acute abnormality. Hepatobiliary: No focal liver abnormality is seen. No gallstones, gallbladder wall thickening, or biliary dilatation. Pancreas: Unremarkable. No pancreatic ductal dilatation or surrounding inflammatory changes. Spleen: Normal in size without focal abnormality. Adrenals/Urinary Tract: Adrenal glands are within normal limits. Kidneys demonstrate a normal enhancement pattern bilaterally. No renal calculi or obstructive changes are noted. The bladder is partially distended. Stomach/Bowel: Fecal material is noted throughout the Colon. This is consistent with a degree of mild constipation. Multiple air distended loops of small bowel  are seen as well no definitive transition zone is noted. This may represent a generalized small bowel ileus. Correlate with physical exam. The stomach is unremarkable with the exception of a small sliding-type hiatal hernia. Vascular/Lymphatic: Aortic atherosclerosis. No enlarged abdominal or pelvic lymph nodes. Reproductive: Uterus is not well visualized. No pelvic mass is seen. Evaluation is limited due to patient motion artifact and scatter artifact from the left hip replacement. Other: No abdominal wall hernia or abnormality. No abdominopelvic ascites. Musculoskeletal: Status post left hip replacement. Degenerative changes of lumbar spine are noted. No acute bony abnormality is seen. Chronic scoliosis is noted. IMPRESSION: Severely limited exam secondary to patient motion artifact. Gaseous distension of the large and small bowel which may represent a generalized ileus. Correlate with the physical exam. Some retained fecal material is noted although no definitive constipation is seen. These changes may be related to pain medication given the recent hip surgery. No other definitive abnormality is noted. Electronically Signed   By: Inez Catalina M.D.   On:  12/02/2020 22:49   DG Abd Portable 2V  Result Date: 12/03/2020 CLINICAL DATA:  Abdominal bloating EXAM: PORTABLE ABDOMEN - 2 VIEW COMPARISON:  None. FINDINGS: Numerous dilated air-filled loops of small and large bowel are seen. Moderate stool burden. Prior left hip arthroplasty. IMPRESSION: Numerous dilated air-filled loops of small and large bowel are seen, findings are likely due to ileus, early or partial small bowel obstruction could have a similar appearance. Electronically Signed   By: Yetta Glassman M.D.   On: 12/03/2020 10:32        Scheduled Meds:  aspirin EC  81 mg Oral Daily   carbidopa-levodopa  1 tablet Oral 6 X Daily   enoxaparin (LOVENOX) injection  40 mg Subcutaneous Q24H   feeding supplement  237 mL Oral TID BM   ferrous sulfate  325 mg Oral Q breakfast   mirtazapine  30 mg Oral QHS   multivitamin-lutein  1 capsule Oral Daily   rasagiline  1 mg Oral Daily   vancomycin  500 mg Oral Q6H   cyanocobalamin  100 mcg Oral Daily   Continuous Infusions:  sodium chloride 100 mL/hr at 12/03/20 0258   metronidazole       LOS: 0 days    Time spent: 20 minutes    Edwin Dada, MD [facility] 12/03/2020, 1:11 PM     Please page though Shea Evans or Epic secure chat:  For password, contact charge nurse

## 2020-12-03 NOTE — ED Notes (Signed)
Patient thrashing in the bed due to parkinson's. Tried to put pt in bed pan pt did not give any urine or stool. Pt husband at bedside says he will leave soon. Pt pulse elevated.

## 2020-12-03 NOTE — ED Notes (Signed)
In and outed pt for urine, when doing so pt began to void on herself with stool and urine. Rectal tube being inserted upon pt diarrhea continuing.

## 2020-12-03 NOTE — ED Notes (Signed)
Hip abduction pillow picked up from OR.

## 2020-12-03 NOTE — ED Notes (Signed)
Patient cleaned and brief placed. Mexiplex placed on bottom and left thigh due to minor breakdown.

## 2020-12-04 DIAGNOSIS — K567 Ileus, unspecified: Secondary | ICD-10-CM

## 2020-12-04 DIAGNOSIS — E43 Unspecified severe protein-calorie malnutrition: Secondary | ICD-10-CM | POA: Insufficient documentation

## 2020-12-04 DIAGNOSIS — Z7189 Other specified counseling: Secondary | ICD-10-CM

## 2020-12-04 DIAGNOSIS — R1084 Generalized abdominal pain: Secondary | ICD-10-CM

## 2020-12-04 LAB — CBC
HCT: 27.7 % — ABNORMAL LOW (ref 36.0–46.0)
Hemoglobin: 9.4 g/dL — ABNORMAL LOW (ref 12.0–15.0)
MCH: 29.1 pg (ref 26.0–34.0)
MCHC: 33.9 g/dL (ref 30.0–36.0)
MCV: 85.8 fL (ref 80.0–100.0)
Platelets: 444 10*3/uL — ABNORMAL HIGH (ref 150–400)
RBC: 3.23 MIL/uL — ABNORMAL LOW (ref 3.87–5.11)
RDW: 16.4 % — ABNORMAL HIGH (ref 11.5–15.5)
WBC: 8.1 10*3/uL (ref 4.0–10.5)
nRBC: 0 % (ref 0.0–0.2)

## 2020-12-04 LAB — BASIC METABOLIC PANEL
Anion gap: 8 (ref 5–15)
BUN: 33 mg/dL — ABNORMAL HIGH (ref 8–23)
CO2: 23 mmol/L (ref 22–32)
Calcium: 8.3 mg/dL — ABNORMAL LOW (ref 8.9–10.3)
Chloride: 110 mmol/L (ref 98–111)
Creatinine, Ser: 0.76 mg/dL (ref 0.44–1.00)
GFR, Estimated: >60 mL/min (ref >60)
Glucose, Bld: 110 mg/dL — ABNORMAL HIGH (ref 70–99)
Potassium: 3.3 mmol/L — ABNORMAL LOW (ref 3.5–5.1)
Sodium: 141 mmol/L (ref 135–145)

## 2020-12-04 MED ORDER — POTASSIUM CHLORIDE 20 MEQ PO PACK
20.0000 meq | PACK | Freq: Once | ORAL | Status: AC
  Administered 2020-12-04: 20 meq via ORAL
  Filled 2020-12-04: qty 1

## 2020-12-04 MED ORDER — VANCOMYCIN HCL 125 MG PO CAPS
125.0000 mg | ORAL_CAPSULE | Freq: Four times a day (QID) | ORAL | Status: DC
  Administered 2020-12-04 – 2020-12-05 (×4): 125 mg via ORAL
  Filled 2020-12-04 (×7): qty 1

## 2020-12-04 NOTE — Discharge Instructions (Signed)

## 2020-12-04 NOTE — Progress Notes (Signed)
[facility] Encounter Note  Patient: Jasmine Colon / Admit Date: 12/02/2020 / Date of Encounter: 12/04/2020, 1:27 PM   Subjective: Patient is overall relatively improved from admission.  No apparent significant cardiovascular symptoms, no angina, no congestive heart failure, and no rhythm disturbances.  Overall patient is not hypoxic anymore.  Review of Systems: Positive for: Weakness Negative for: Vision change, hearing change, syncope, dizziness, nausea, vomiting,diarrhea, bloody stool, stomach pain, cough, congestion, diaphoresis, urinary frequency, urinary pain,skin lesions, skin rashes Others previously listed  Objective: Telemetry: Normal sinus rhythm Physical Exam: Blood pressure 97/72, pulse 99, temperature 98.9 F (37.2 C), temperature source Oral, resp. rate 18, height '5\' 5"'$  (1.651 m), weight 49.9 kg, SpO2 93 %. Body mass index is 18.3 kg/m. General: Well developed, well nourished, in no acute distress. Head: Normocephalic, atraumatic, sclera non-icteric, no xanthomas, nares are without discharge. Neck: No apparent masses Lungs: Normal respirations with no wheezes, no rhonchi, no rales , no crackles   Heart: Regular rate and rhythm, normal S1 S2, no murmur, no rub, no gallop, PMI is normal size and placement, carotid upstroke normal without bruit, jugular venous pressure normal Abdomen: Soft, non-tender, non-distended with normoactive bowel sounds. No hepatosplenomegaly. Abdominal aorta is normal size without bruit Extremities: No edema, no clubbing, no cyanosis, no ulcers,  Peripheral: 2+ radial, 2+ femoral, 2+ dorsal pedal pulses Neuro: Alert and oriented. Moves all extremities spontaneously. Psych:  Responds to questions appropriately with a normal affect.   Intake/Output Summary (Last 24 hours) at 12/04/2020 1327 Last data filed at 12/04/2020 0300 Gross per 24 hour  Intake 2200 ml  Output --  Net 2200 ml    Inpatient Medications:  . aspirin  EC  81 mg Oral Daily  . carbidopa-levodopa  1 tablet Oral 6 X Daily  . enoxaparin (LOVENOX) injection  40 mg Subcutaneous Q24H  . feeding supplement  237 mL Oral TID BM  . ferrous sulfate  325 mg Oral Q breakfast  . mirtazapine  30 mg Oral QHS  . multivitamin-lutein  1 capsule Oral Daily  . rasagiline  1 mg Oral Daily  . vancomycin  125 mg Oral Q6H  . cyanocobalamin  100 mcg Oral Daily   Infusions:  . sodium chloride 100 mL/hr at 12/03/20 1655  . metronidazole 500 mg (12/04/20 1221)    Labs: Recent Labs    12/02/20 1523 12/03/20 0742 12/04/20 0452  NA 136 137 141  K 3.8 3.6 3.3*  CL 99 101 110  CO2 '27 24 23  '$ GLUCOSE 114* 98 110*  BUN 48* 46* 33*  CREATININE 1.07* 1.06* 0.76  CALCIUM 9.6 9.0 8.3*  MG 2.1  --   --    Recent Labs    12/02/20 1523  AST 15  ALT <5  ALKPHOS 117  BILITOT 1.1  PROT 6.6  ALBUMIN 2.9*   Recent Labs    12/03/20 0742 12/04/20 0452  WBC 8.4 8.1  HGB 10.3* 9.4*  HCT 33.1* 27.7*  MCV 90.9 85.8  PLT 415* 444*   No results for input(s): CKTOTAL, CKMB, TROPONINI in the last 72 hours. Invalid input(s): POCBNP No results for input(s): HGBA1C in the last 72 hours.   Weights: Filed Weights   12/02/20 1458  Weight: 49.9 kg     Radiology/Studies:  DG Chest 2 View  Result Date: 12/02/2020 CLINICAL DATA:  Hypoxia. EXAM: CHEST - 2 VIEW COMPARISON:  10/03/2020, lung bases from abdominal CT earlier today also reviewed FINDINGS: Normal heart size. Mild aortic  atherosclerosis and tortuosity. The lungs are clear. Pulmonary vasculature is normal. No consolidation, pleural effusion, or pneumothorax. The bones are diffusely under mineralized. No acute osseous abnormalities are seen. Gaseous distention of bowel in the upper abdomen as seen on CT earlier today. IMPRESSION: No acute chest finding. Electronically Signed   By: Keith Rake M.D.   On: 12/02/2020 22:59   CT Angio Chest PE W/Cm &/Or Wo Cm  Result Date: 12/03/2020 CLINICAL DATA:   Abdominal pain, status post hip replacement, Parkinson's disease EXAM: CT ANGIOGRAPHY CHEST WITH CONTRAST TECHNIQUE: Multidetector CT imaging of the chest was performed using the standard protocol during bolus administration of intravenous contrast. Multiplanar CT image reconstructions and MIPs were obtained to evaluate the vascular anatomy. CONTRAST:  67m OMNIPAQUE IOHEXOL 350 MG/ML SOLN COMPARISON:  Chest radiograph dated 12/02/2020 FINDINGS: Motion degraded images. Cardiovascular: Satisfactory opacification the bilateral pulmonary arteries to the segmental level. No evidence of pulmonary embolism. Enlargement of the main pulmonary artery, suggesting pulmonary arterial hypertension. Although not tailored for evaluation of the thoracic aorta, there is no evidence of thoracic aortic aneurysm or dissection. The heart is normal in size.  No pericardial effusion. Mediastinum/Nodes: No suspicious mediastinal lymphadenopathy. Visualized thyroid is unremarkable. Lungs/Pleura: Evaluation lung parenchyma is constrained by respiratory motion. Within that constraint, there are no suspicious pulmonary nodules. Mild dependent atelectasis in the bilateral lower lobes. No focal consolidation. Mild centrilobular and paraseptal emphysematous changes. No pleural effusion or pneumothorax. Upper Abdomen: Evaluated on recent CT abdomen/pelvis. Musculoskeletal: Old right lateral 5th and 6th rib fracture deformities. Visualized osseous structures are otherwise within normal limits. Review of the MIP images confirms the above findings. IMPRESSION: Motion degraded images. No evidence of pulmonary embolism. Enlargement of the main pulmonary artery, suggesting pulmonary arterial hypertension. Aortic Atherosclerosis (ICD10-I70.0) and Emphysema (ICD10-J43.9). Electronically Signed   By: SJulian HyM.D.   On: 12/03/2020 01:03   CT Abdomen Pelvis W Contrast  Result Date: 12/02/2020 CLINICAL DATA:  Abdominal bloating with diarrhea  EXAM: CT ABDOMEN AND PELVIS WITH CONTRAST TECHNIQUE: Multidetector CT imaging of the abdomen and pelvis was performed using the standard protocol following bolus administration of intravenous contrast. Examination is significantly limited secondary to patient motion artifact related to the known Parkinson's disease. CONTRAST:  829mOMNIPAQUE IOHEXOL 350 MG/ML SOLN COMPARISON:  None. FINDINGS: Lower chest: No acute abnormality. Hepatobiliary: No focal liver abnormality is seen. No gallstones, gallbladder wall thickening, or biliary dilatation. Pancreas: Unremarkable. No pancreatic ductal dilatation or surrounding inflammatory changes. Spleen: Normal in size without focal abnormality. Adrenals/Urinary Tract: Adrenal glands are within normal limits. Kidneys demonstrate a normal enhancement pattern bilaterally. No renal calculi or obstructive changes are noted. The bladder is partially distended. Stomach/Bowel: Fecal material is noted throughout the colon. This is consistent with a degree of mild constipation. Multiple air distended loops of small bowel are seen as well no definitive transition zone is noted. This may represent a generalized small bowel ileus. Correlate with physical exam. The stomach is unremarkable with the exception of a small sliding-type hiatal hernia. Vascular/Lymphatic: Aortic atherosclerosis. No enlarged abdominal or pelvic lymph nodes. Reproductive: Uterus is not well visualized. No pelvic mass is seen. Evaluation is limited due to patient motion artifact and scatter artifact from the left hip replacement. Other: No abdominal wall hernia or abnormality. No abdominopelvic ascites. Musculoskeletal: Status post left hip replacement. Degenerative changes of lumbar spine are noted. No acute bony abnormality is seen. Chronic scoliosis is noted. IMPRESSION: Severely limited exam secondary to patient motion artifact. Gaseous distension  of the large and small bowel which may represent a generalized  ileus. Correlate with the physical exam. Some retained fecal material is noted although no definitive constipation is seen. These changes may be related to pain medication given the recent hip surgery. No other definitive abnormality is noted. Electronically Signed   By: Inez Catalina M.D.   On: 12/02/2020 22:49   DG Abd Portable 2V  Result Date: 12/03/2020 CLINICAL DATA:  Abdominal bloating EXAM: PORTABLE ABDOMEN - 2 VIEW COMPARISON:  None. FINDINGS: Numerous dilated air-filled loops of small and large bowel are seen. Moderate stool burden. Prior left hip arthroplasty. IMPRESSION: Numerous dilated air-filled loops of small and large bowel are seen, findings are likely due to ileus, early or partial small bowel obstruction could have a similar appearance. Electronically Signed   By: Yetta Glassman M.D.   On: 12/03/2020 10:32     Assessment and Recommendation  80 y.o. female with known Parkinson's and some abdominal discomfort with acute hypoxia of unknown etiology now improved without evidence of congestive heart failure and/or acute coronary syndrome 1.  Continue supportive care for significant concerns of malnutrition and weakness and fatigue 2.  No further cardiac diagnostics necessary at this time 3.  Please call if any of further questions  Signed, Serafina Royals M.D. FACC

## 2020-12-04 NOTE — Progress Notes (Signed)
Initial Nutrition Assessment  DOCUMENTATION CODES:  Severe malnutrition in context of chronic illness, Underweight  INTERVENTION:  Continue Ensure Plus TID.  Continue Magic Cup TID with meals.  Add Vital Cuisine Shake BID, each supplement provides 520 kcal and 22 grams of protein.  Continue vitamin supplementation.  Recommend assistance with feeding patient at meal times.  NUTRITION DIAGNOSIS:  Severe Malnutrition related to chronic illness (Parkinson's disease with tremors) as evidenced by severe fat depletion, severe muscle depletion.  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 Admitted with infectious gastroenteritis.  Spoke with pt at bedside. She reports eating well, but with her Parkinson's tremors, she is constantly moving and burning calories. Her MD had previously discussed this with her. Reiterated the importance of taking in adequate protein and calories.  Per Epic, pt has lost 3.5 lbs (3%) in the last 2 months, which is not necessarily significant for the time frame. However, it appears last 4 weights, including admission weight, have been stated. RD to order measured weight.  On exam, pt with moderate to severe depletions except in calves, which are not depleted at all, due to Parkinson's tremors.  Recommend continuing Ensure Plus TID, and adding Magic Cup TID and Hormel shakes BID.  Pt would likely benefit from assistance with feeding at mealtimes, given tremors.  Medications:  reviewed; EE TID, Remeron, MVI-Lutein, Vancomycin QID, Vitamin B12, NaCl @ 100 ml/hr via IV, Flagyl TID via IV  Labs: reviewed; K 3.3 (L), Glucose 110 (H)  NUTRITION - FOCUSED PHYSICAL EXAM: Flowsheet Row Most Recent Value  Orbital Region Severe depletion  Upper Arm Region Severe depletion  Thoracic and Lumbar Region Moderate depletion  Buccal Region Severe depletion  Temple Region Moderate depletion  Clavicle Bone Region Severe depletion  Clavicle and Acromion Bone Region Severe depletion  Scapular Bone Region Severe depletion  Dorsal Hand Moderate depletion  Patellar Region Moderate depletion  Anterior Thigh Region Moderate depletion  Posterior Calf Region No depletion  Edema (RD Assessment) None  Hair Reviewed  Eyes Reviewed  Mouth Reviewed  Skin Reviewed  Nails Reviewed   Diet Order:   Diet Order             DIET - DYS 1 Room service appropriate? Yes; Fluid consistency: Thin  Diet effective now                  EDUCATION NEEDS:  Education needs have been addressed  Skin:  Skin Assessment: Skin Integrity Issues: Skin Integrity Issues:: Stage II Stage II: Pressure Injury - L posterior thigh  Last BM:  12/03/20 - Type 7, black, large  Height:  Ht Readings from Last 1 Encounters:  12/02/20 '5\' 5"'$  (1.651 m)   Weight:  Wt Readings from Last 1 Encounters:  12/02/20 49.9 kg   BMI:  Body mass index is 18.3 kg/m.  Estimated Nutritional Needs:  Kcal:  2100-2300 Protein:  65-80 grams Fluid: >2.1 L  Derrel Nip, RD, LDN (she/her/hers) Registered Dietitian I After-Hours/Weekend Pager # [phone]

## 2020-12-04 NOTE — Progress Notes (Signed)
[facility] [place] NAME: Jasmine Colon    MR#:  [id number]  DATE OF BIRTH:  1940/08/06  SUBJECTIVE:  resting tremor secondary to her Parkinson's. No large-volume diarrhea today per RN. Patient is wanting to go home. Tolerating some pured diet. no Family at bedside.  REVIEW OF SYSTEMS:   Review of Systems  Unable to perform ROS: Medical condition  Tolerating Diet: Tolerating PT:   DRUG ALLERGIES:   Allergies  Allergen Reactions  . Codeine     Feels weird  . Pseudoephedrine     Other reaction(s): Dizziness and giddiness (finding)    VITALS:  Blood pressure 97/72, pulse 99, temperature 98.9 F (37.2 C), temperature source Oral, resp. rate 18, height '5\' 5"'$  (1.651 m), weight 49.9 kg, SpO2 93 %.  PHYSICAL EXAMINATION:   Physical Exam  GENERAL:  80 y.o.-year-old patient lying in the bed with no acute distress. Thin cachectic appears chronically ill malnourished LUNGS: Normal breath sounds bilaterally, no wheezing, rales, rhonchi. No use of accessory muscles of respiration.  CARDIOVASCULAR: S1, S2 normal. No murmurs, rubs, or gallops.  ABDOMEN: Soft, nontender, nondistended. EXTREMITIES: No cyanosis, clubbing or edema b/l.    NEUROLOGIC: resting tremors. No focal deficit PSYCHIATRIC:  patient is alert  SKIN: as below.   LABORATORY PANEL:  CBC Recent Labs  Lab 12/04/20 0452  WBC 8.1  HGB 9.4*  HCT 27.7*  PLT 444*    Chemistries  Recent Labs  Lab 12/02/20 1523 12/03/20 0742 12/04/20 0452  NA 136   < > 141  K 3.8   < > 3.3*  CL 99   < > 110  CO2 27   < > 23  GLUCOSE 114*   < > 110*  BUN 48*   < > 33*  CREATININE 1.07*   < > 0.76  CALCIUM 9.6   < > 8.3*  MG 2.1  --   --   AST 15  --   --   ALT <5  --   --   ALKPHOS 117  --   --   BILITOT 1.1  --   --    < > = values in this interval not displayed.   Cardiac Enzymes No results for input(s): TROPONINI in the last 168 hours. RADIOLOGY:  DG Chest 2 View  Result  Date: 12/02/2020 CLINICAL DATA:  Hypoxia. EXAM: CHEST - 2 VIEW COMPARISON:  10/03/2020, lung bases from abdominal CT earlier today also reviewed FINDINGS: Normal heart size. Mild aortic atherosclerosis and tortuosity. The lungs are clear. Pulmonary vasculature is normal. No consolidation, pleural effusion, or pneumothorax. The bones are diffusely under mineralized. No acute osseous abnormalities are seen. Gaseous distention of bowel in the upper abdomen as seen on CT earlier today. IMPRESSION: No acute chest finding. Electronically Signed   By: Keith Rake M.D.   On: 12/02/2020 22:59   CT Angio Chest PE W/Cm &/Or Wo Cm  Result Date: 12/03/2020 CLINICAL DATA:  Abdominal pain, status post hip replacement, Parkinson's disease EXAM: CT ANGIOGRAPHY CHEST WITH CONTRAST TECHNIQUE: Multidetector CT imaging of the chest was performed using the standard protocol during bolus administration of intravenous contrast. Multiplanar CT image reconstructions and MIPs were obtained to evaluate the vascular anatomy. CONTRAST:  61m OMNIPAQUE IOHEXOL 350 MG/ML SOLN COMPARISON:  Chest radiograph dated 12/02/2020 FINDINGS: Motion degraded images. Cardiovascular: Satisfactory opacification the bilateral pulmonary arteries to the segmental level. No evidence of pulmonary embolism. Enlargement of the main pulmonary artery,  suggesting pulmonary arterial hypertension. Although not tailored for evaluation of the thoracic aorta, there is no evidence of thoracic aortic aneurysm or dissection. The heart is normal in size.  No pericardial effusion. Mediastinum/Nodes: No suspicious mediastinal lymphadenopathy. Visualized thyroid is unremarkable. Lungs/Pleura: Evaluation lung parenchyma is constrained by respiratory motion. Within that constraint, there are no suspicious pulmonary nodules. Mild dependent atelectasis in the bilateral lower lobes. No focal consolidation. Mild centrilobular and paraseptal emphysematous changes. No pleural  effusion or pneumothorax. Upper Abdomen: Evaluated on recent CT abdomen/pelvis. Musculoskeletal: Old right lateral 5th and 6th rib fracture deformities. Visualized osseous structures are otherwise within normal limits. Review of the MIP images confirms the above findings. IMPRESSION: Motion degraded images. No evidence of pulmonary embolism. Enlargement of the main pulmonary artery, suggesting pulmonary arterial hypertension. Aortic Atherosclerosis (ICD10-I70.0) and Emphysema (ICD10-J43.9). Electronically Signed   By: Julian Hy M.D.   On: 12/03/2020 01:03   CT Abdomen Pelvis W Contrast  Result Date: 12/02/2020 CLINICAL DATA:  Abdominal bloating with diarrhea EXAM: CT ABDOMEN AND PELVIS WITH CONTRAST TECHNIQUE: Multidetector CT imaging of the abdomen and pelvis was performed using the standard protocol following bolus administration of intravenous contrast. Examination is significantly limited secondary to patient motion artifact related to the known Parkinson's disease. CONTRAST:  29m OMNIPAQUE IOHEXOL 350 MG/ML SOLN COMPARISON:  None. FINDINGS: Lower chest: No acute abnormality. Hepatobiliary: No focal liver abnormality is seen. No gallstones, gallbladder wall thickening, or biliary dilatation. Pancreas: Unremarkable. No pancreatic ductal dilatation or surrounding inflammatory changes. Spleen: Normal in size without focal abnormality. Adrenals/Urinary Tract: Adrenal glands are within normal limits. Kidneys demonstrate a normal enhancement pattern bilaterally. No renal calculi or obstructive changes are noted. The bladder is partially distended. Stomach/Bowel: Fecal material is noted throughout the colon. This is consistent with a degree of mild constipation. Multiple air distended loops of small bowel are seen as well no definitive transition zone is noted. This may represent a generalized small bowel ileus. Correlate with physical exam. The stomach is unremarkable with the exception of a small  sliding-type hiatal hernia. Vascular/Lymphatic: Aortic atherosclerosis. No enlarged abdominal or pelvic lymph nodes. Reproductive: Uterus is not well visualized. No pelvic mass is seen. Evaluation is limited due to patient motion artifact and scatter artifact from the left hip replacement. Other: No abdominal wall hernia or abnormality. No abdominopelvic ascites. Musculoskeletal: Status post left hip replacement. Degenerative changes of lumbar spine are noted. No acute bony abnormality is seen. Chronic scoliosis is noted. IMPRESSION: Severely limited exam secondary to patient motion artifact. Gaseous distension of the large and small bowel which may represent a generalized ileus. Correlate with the physical exam. Some retained fecal material is noted although no definitive constipation is seen. These changes may be related to pain medication given the recent hip surgery. No other definitive abnormality is noted. Electronically Signed   By: MInez CatalinaM.D.   On: 12/02/2020 22:49   DG Abd Portable 2V  Result Date: 12/03/2020 CLINICAL DATA:  Abdominal bloating EXAM: PORTABLE ABDOMEN - 2 VIEW COMPARISON:  None. FINDINGS: Numerous dilated air-filled loops of small and large bowel are seen. Moderate stool burden. Prior left hip arthroplasty. IMPRESSION: Numerous dilated air-filled loops of small and large bowel are seen, findings are likely due to ileus, early or partial small bowel obstruction could have a similar appearance. Electronically Signed   By: LYetta GlassmanM.D.   On: 12/03/2020 10:32   ASSESSMENT AND PLAN:  Jasmine Colon a 80y.o. 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  Severe sepsis due to C. Difficile--POA -Presents with tachycardia,  leukocytosis, decreased mentation/encephalopathy, and hypoxia without explanation in the setting of C. difficile diarrhea. - Stop Rocephin - Start oral vancomycin with Flagyl IV for severe C. difficile with possible ileus-- Ok with ID to use above regimen   Possible ileus Resting stool, but dilated bowel on imaging. - Serial x-ray --currently not having volume diarrhea   Acute metabolic encephaloapthy Due to sepsis   Parkinson's disease - Continue rasagiline, Sinemet  Severe protein calorie malnutrition As evidenced by 3% weight loss in last 2 months, poor PO intake and severe generalized loss of subcutaneous muscle mass and fat. - Continue mirtazapine Nutrition Status: Nutrition Problem: Severe Malnutrition Etiology: chronic illness (Parkinson's disease with tremors) Signs/Symptoms: severe fat depletion, severe muscle depletion Interventions: Ensure Enlive (each supplement provides 350kcal and 20 grams of protein), Magic cup, MVI, Hormel Shake  Recent acute blood loss anemia During recent hospitalization.  Hgb now stable relative to recent baseline - Continue iron   Dehydration without AKI Cr 1.08 from baseline 0.8, BUN elevated   Pressure injury Present on admission Pressure Injury 12/03/20 Thigh Left;Posterior Stage 2 -  Partial thickness loss of dermis presenting as a shallow open injury with a red, pink wound bed without slough. Redness and small skin breakdown (Active)  12/03/20 1700  Location: Thigh  Location Orientation: Left;Posterior  Staging: Stage 2 -  Partial thickness loss of dermis presenting as a shallow open injury with a red, pink wound bed without slough.  Wound Description (Comments): Redness and small skin breakdown  Present on Admission: Yes  Remains inpatient appropriate because:Altered mental status and IV treatments appropriate due to intensity of illness or inability to take PO   Dispo: The patient is from: home              Anticipated d/c is to:  home per family request              Patient currently is not medically stable to d/c.              Difficult to place patient No Level of care: Med-Surg Status is: Inpatient  Palliative care to see pt--repeated admissions and multiple co-morbidities       TOTAL TIME TAKING CARE OF THIS PATIENT: 25 minutes.  >50% time spent on counselling and coordination of care  Note: This dictation was prepared with Dragon dictation along with smaller phrase technology. Any transcriptional errors that result from this process are unintentional.  Fritzi Mandes M.D    [facility]   CC: Primary care physician; Dion Body, MD Patient ID: Jasmine Colon, female   DOB: [birth year], 80 y.o.   MRN: [id number]

## 2020-12-04 NOTE — TOC Initial Note (Signed)
Transition of Care [facility]) - Initial/Assessment Note    Patient Details  Name: Jasmine Colon MRN: [id number] Date of Birth: [birth year]  Transition of Care [facility]) CM/SW Contact:    Beverly Sessions, RN Phone Number: 12/04/2020, 10:11 AM  Clinical Narrative:                  Admitted [id number] Admitted from:Home with husband,  daughter lives locally and also provides care [id number] Pharmacy: [place], denies issues obtaining medications Current home health/prior home health/DME:  [facility], sara notified of admission Patient has hospital bed, 3in1, and RW in the home  Daughter states that with the patient's current mental state it is difficult to care for at home.  Her goal is for mental status to improve and patient to return home.  Not interested in placement at this time.  Would like information on PCS services.  Email PCS list.  Daughter also agreeable for [employer] to contact her to assist with arranging services.  Danielle at [facility] notified   Expected Discharge Plan: [place] Barriers to Discharge: Continued Medical Work up   Patient Goals and CMS Choice        Expected Discharge Plan and Services Expected Discharge Plan: [place]       Living arrangements for the past 2 months: Single Family Home                           HH Arranged: PT, OT, Nurse's Aide HH Agency: [facility] Date [facility] Agency Contacted: 12/04/20   Representative spoke with at [place]: [place] Arrangements/Services Living arrangements for the past 2 months: [place] Lives with:: Adult Children, Spouse Patient language and need for interpreter reviewed:: Yes Do you feel safe going back to the place where you live?: Yes      Need for Family Participation in Patient Care: Yes (Comment) Care giver support system in place?: Yes (comment) Current home services: DME Criminal Activity/Legal  Involvement Pertinent to Current Situation/Hospitalization: No - Comment as needed  Activities of Daily Living Home Assistive Devices/Equipment: None ADL Screening (condition at time of admission) Patient's cognitive ability adequate to safely complete daily activities?: No Is the patient deaf or have difficulty hearing?: Yes Does the patient have difficulty seeing, even when wearing glasses/contacts?: No Does the patient have difficulty concentrating, remembering, or making decisions?: Yes Patient able to express need for assistance with ADLs?: No Does the patient have difficulty dressing or bathing?: Yes Independently performs ADLs?: No Communication: Independent Dressing (OT): Needs assistance Is this a change from baseline?: Pre-admission baseline Grooming: Needs assistance Is this a change from baseline?: Pre-admission baseline Feeding: Needs assistance Is this a change from baseline?: Pre-admission baseline Bathing: Needs assistance Is this a change from baseline?: Pre-admission baseline Toileting: Needs assistance Is this a change from baseline?: Pre-admission baseline In/Out Bed: Needs assistance Is this a change from baseline?: Pre-admission baseline Walks in Home: Needs assistance Is this a change from baseline?: Pre-admission baseline Does the patient have difficulty walking or climbing stairs?: Yes Weakness of Legs: Both Weakness of Arms/Hands: None  Permission Sought/Granted                  Emotional Assessment       Orientation: : Oriented to Self Alcohol / Substance Use: Not Applicable Psych Involvement: No (comment)  Admission diagnosis:  Infectious gastroenteritis [  A09] Ileus ([place]) [K56.7] Generalized abdominal pain [R10.84] Acute respiratory failure with hypoxia (HCC) [J96.01] Diarrhea, unspecified type [R19.7] Patient Active Problem List   Diagnosis Date Noted   Infectious gastroenteritis 12/03/2020   Pressure injury of skin 12/03/2020   Hip  dislocation, left ([place]) 10/03/2020   Dislocated hip ([place]) 09/28/2020   Dislocation of hip prosthesis, initial encounter ([place]) 09/27/2020   Dislocation of hip joint prosthesis, initial encounter ([place]) 09/27/2020   Postoperative anemia 09/27/2020   AKI (acute kidney injury) ([place]) 09/27/2020   Malnutrition of moderate degree 09/27/2020   Accidental fall    Hyperlipidemia    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 [place]Phone: [phone]: [phone]    Social Determinants of Health (SDOH) Interventions    Readmission Risk Interventions Readmission Risk Prevention Plan 12/04/2020  Transportation Screening Complete  Medication Review (Press photographer Complete  HRI or HA[place]  SW Recovery Care/Counseling Consult Complete  Palliative Care Screening Complete  SEl Prado EstatesPatient Refused  Some recent data might be hidden

## 2020-12-04 NOTE — Progress Notes (Signed)
Speech Language Pathology Treatment: Dysphagia  Patient Details Name: Jasmine Colon MRN: [id number] DOB: 09-09-1940 Today's Date: 12/04/2020 Time: 1445-1530 SLP Time Calculation (min) (ACUTE ONLY): 45 min  Assessment / Plan / Recommendation Clinical Impression  Pt seen for ongoing assessment of swallowing and trials to upgrade current pureed diet. She was more alert during session vs at eval, verbally responsive and engaged in conversation w/ SLP. Distracted easily. Increased motor movements moreso in limbs d/t Parkinson's Dis. Brother present. Pt is on RA; wbc wnl. Pt frequently stated No desire for po trials/oral intake d/t feelings of dislike for food and nausea feelings. NSG/MD informed for PRN med for nausea.  Pt explained general aspiration precautions and agreed verbally to the need for following them especially sitting upright for all oral intake -- much support given behind the back for full upright sitting. Pt assisted w/ positioning d/t weakness and increased motor movements. She agreed to a few trials of thin liquids and accepted food trial x1 for the tx session. She appeared to not want any of the oral intake but agreed given encouragement by SLP and Brother present. Pt fed self sips of thin liquids via straw then the bite of graham cracker moistened. NO overt clinical s/s of aspiration were noted w/ any consistency; respiratory status remained calm and unlabored, vocal quality clear b/t trials. Oral phase appeared grossly [facility] for bolus management, mastication, and timely A-P transfer for swallowing; oral clearing achieved w/ all trials. Brother denied any deficits in swallowing sips of liquids as well. Pt adamently refused any further food trials.    Pt appears at reduced risk for aspriation when following general aspiration precautions and when given support at meals, monitoring. She appears weak and cachectic -- unsure what her oral intake is at home. Recommend a mech soft diet for  ease of soft foods w/ gravies added to moisten foods; Thin liquids. Recommend general aspiration precautions; Pills Whole vs Crushed in Puree for ease of swallowing as needed; tray setup and positioning assistance for meals. Verbal encouragement for oral intake w/ Dietician f/u for support of any supplements. REFLUX precautions. ST services will sign off at this time w/ MD to reconsult if any new needs while admitted. Noted Palliative Care f/u for overall GOC and insight into nutrition/hydration needs; oral intake. MD/NSG updated. Precautions posted at bedside. A menu was provided; both pt and Brother agreed.     HPI HPI: Pt is a 80 y.o. female with past medical history of Parkinson Disease w/ increased motor movements in limbs, asthma, malnourishment, and hyperlipidemia who presents to the ED complaining of abdominal pain.  Patient reports that she has had 3 to 4 days of gradually worsening pain in her lower abdomen that has been occurring intermittently.  She describes it as sharp and stabbing, can come on at any time and is not exacerbated or alleviated by anything in particular.  It does not affect 1 side of her abdomen anymore than the other.  She has felt nauseous with a few episodes of dry heaving, but denies vomiting.  She does endorse diarrhea with multiple episodes of dark foul-smelling stools.  She has not been on any antibiotics recently and she has not noticed any blood in her stool.  She denies any fevers, cough, chest pain, or shortness of breath.  She has not had any dysuria or hematuria.  She has been bedbound for the past 7 weeks due to recent hip replacement.  CXR: No acute chest finding.  OF  NOTE: pt had an episode described as "Patient thrashing in the bed" post admit -- unsure if related to Cognitive decline and/or any Cognitive decline related to the Baseline Parkinson's Dis. Pt was given Ativan to calm per chart notes.      SLP Plan  All goals met (pt appears at her baseline)        Recommendations  Diet recommendations: Dysphagia 3 (mechanical soft);Thin liquid Liquids provided via: Straw;Cup (prefers straw) Medication Administration: Crushed with puree (as needed) Supervision: Patient able to self feed;Staff to assist with self feeding;Intermittent supervision to cue for compensatory strategies Compensations: Minimize environmental distractions;Slow rate;Small sips/bites;Lingual sweep for clearance of pocketing;Follow solids with liquid Postural Changes and/or Swallow Maneuvers: Out of bed for meals;Seated upright 90 degrees;Upright 30-60 min after meal                General recommendations:  (Dietician f/u) Oral Care Recommendations: Oral care BID;Oral care before and after PO;Staff/trained caregiver to provide oral care Follow up Recommendations: None (placement TBD) SLP Visit Diagnosis: Dysphagia, unspecified (R13.10) Plan: All goals met (pt appears at her baseline)       [place], [place], [place] [phone] [facility] 12/04/2020, 4:12 PM

## 2020-12-04 NOTE — Consult Note (Addendum)
Consultation Note Date: 12/04/2020   Patient Name: Jasmine Colon  DOB: 1940-07-16  MRN: [id number]  Age / Sex: 80 y.o., female  PCP: Dion Body, MD Referring Physician: Fritzi Mandes, MD  Reason for Consultation: Establishing goals of care  HPI/Patient Profile: Jasmine Colon is a 80 y.o. Caucasian female with medical history significant for asthma, dyslipidemia, Parkinson's disease, breast cancer and low back pain, who presented to the ER with acute onset of abdominal pain which has been worsening over the last 3 to 4 days.  She describes it as sharp and stabbing pain with associated nausea and dry heaves without vomiting.  She has been having diarrhea over the last couple days with foul-smelling stools.   Clinical Assessment and Goals of Care: Patient is resting in bed. She is restless and rolling her legs back and forth and moving her feet. Son is at bedside and states that this is Parkinsonian movement. She states she has had Parkinsons for 13 years. She lives at home with her husband and has 2 children.  She states she uses a wheelchair and walker at baseline. She typically is a lighter eater.   We discussed her diagnoses, prognosis, GOC, EOL wishes disposition and options.  Created space and opportunity for patient  to explore thoughts and feelings regarding current medical information.   A detailed discussion was had today regarding advanced directives.  Concepts specific to code status, artifical feeding and hydration, IV antibiotics and rehospitalization were discussed.  The difference between an aggressive medical intervention path and a comfort care path was discussed.  Values and goals of care important to patient and family were attempted to be elicited.  Discussed limitations of medical interventions to prolong quality of life in some situations and discussed the concept of human  mortality.  She states she will need to consider any boundaries in care, but at this time, would like any care indicated.   She states she is not sure if she has had flatus today. She advises she drank a small amount of Pepsi her son brought and it made her nauseated. Will continue to follow.         SUMMARY OF RECOMMENDATIONS   She states she is not sure if she has had flatus today. She advises she drank a small amount of Pepsi her son brought and it made her nauseated. Will continue to follow.    Full code/full scope.        Recommend outpatient palliative.        Primary Diagnoses: Present on Admission:  Infectious gastroenteritis   I have reviewed the medical record, interviewed the patient and family, and examined the patient. The following aspects are pertinent.  Past Medical History:  Diagnosis Date   Asthma 2012   Hyperlipidemia    Lesion of lateral popliteal nerve    Lower back pain    Malignant neoplasm of upper outer quadrant of female breast ([place]) 2013   T1b, N0, M0. 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  Tobacco Use   Smoking status: Every Day    Packs/day: 0.50    Years: 45.00    Pack years: 22.50    Types: Cigarettes   Smokeless tobacco: Never  Substance and Sexual Activity   Alcohol use: No   Drug use: No   Sexual activity: Not on file  Other Topics Concern   Not on file  Social History Narrative   Patient is right handed, resides with husband   Social  Determinants of Radio broadcast assistant Strain: Not on file  Food Insecurity: Not on file  Transportation Needs: Not on file  Physical Activity: Not on file  Stress: Not on file  Social Connections: Not on file   Family History  Problem Relation Age of Onset   Stroke Mother    Heart failure Father    Heart disease Brother        bypass   Cancer Maternal Aunt        breast    Breast cancer Maternal Aunt    Cancer Maternal Aunt        breast   Breast cancer Maternal Aunt    Scheduled Meds:  aspirin EC  81 mg Oral Daily   carbidopa-levodopa  1 tablet Oral 6 X Daily   enoxaparin (LOVENOX) injection  40 mg Subcutaneous Q24H   feeding supplement  237 mL Oral TID BM   ferrous sulfate  325 mg Oral Q breakfast   mirtazapine  30 mg Oral QHS   multivitamin-lutein  1 capsule Oral Daily   rasagiline  1 mg Oral Daily   vancomycin  125 mg Oral Q6H   cyanocobalamin  100 mcg Oral Daily   Continuous Infusions:  sodium chloride 100 mL/hr at 12/03/20 1655   metronidazole 500 mg (12/04/20 1221)   PRN Meds:.acetaminophen **OR** acetaminophen, fluticasone, ipratropium-albuterol, loratadine, morphine injection, nitroGLYCERIN, ondansetron **OR** ondansetron (ZOFRAN) IV, sodium chloride, traZODone Medications Prior to Admission:  Prior to Admission medications   Medication Sig Start Date End Date Taking? Authorizing Provider  acetaminophen (TYLENOL) 650 MG CR tablet Take 650 mg by mouth every 8 (eight) hours as needed for pain.   Yes [provider]  Calcium Carbonate-Vitamin D (CALTRATE 600+D PO) Take 1 tablet by mouth daily.   Yes [provider]  carbidopa-levodopa (SINEMET IR) 25-100 MG tablet Take 1 tablet by mouth 6 (six) times daily. 09/24/20  Yes Fritzi Mandes, MD  cyanocobalamin 100 MCG tablet Take 100 mcg by mouth daily.   Yes [provider]  feeding supplement (ENSURE ENLIVE / ENSURE PLUS) LIQD Take 237 mLs by mouth 3 (three) times daily between meals.  09/29/20  Yes Danford, Suann Larry, MD  ferrous sulfate 325 (65 FE) MG tablet Take 1 tablet (325 mg total) by mouth daily with breakfast. 09/25/20  Yes Fritzi Mandes, MD  fluticasone [facility]) 50 MCG/ACT nasal spray Place 2 sprays into both nostrils daily as needed for allergies or rhinitis.  Yes [provider]  loratadine (CLARITIN) 10 MG tablet Take 10 mg by mouth daily as needed for allergies.   Yes [provider]  mirtazapine (REMERON) 30 MG tablet Take 30 mg by mouth at bedtime. 06/27/20  Yes [provider]  Multiple Vitamins-Minerals (PRESERVISION AREDS PO) Take 1 capsule by mouth daily.   Yes [provider]  rasagiline (AZILECT) 1 MG TABS tablet Take 1 tablet (1 mg total) by mouth daily. 09/25/20  Yes Fritzi Mandes, MD  sodium chloride (OCEAN) 0.65 % SOLN nasal spray Place 1 spray into both nostrils daily as needed (allergies).   Yes [provider]   Allergies  Allergen Reactions   Codeine     Feels weird   Pseudoephedrine     Other reaction(s): Dizziness and giddiness (finding)   Review of Systems  All other systems reviewed and are negative.  Physical Exam Pulmonary:     Effort: Pulmonary effort is normal.  Neurological:     Mental Status: She is alert.    Vital Signs: BP 97/72 (BP Location: Left Arm)   Pulse 99   Temp 98.9 F (37.2 C) (Oral)   Resp 18   Ht '5\' 5"'$  (1.651 m)   Wt 49.9 kg   SpO2 93%   BMI 18.30 kg/m  Pain Scale: 0-10   Pain Score: 0-No pain   SpO2: SpO2: 93 % O2 Device:SpO2: 93 % O2 Flow Rate: .O2 Flow Rate (L/min): 2 L/min  IO: Intake/output summary:  Intake/Output Summary (Last 24 hours) at 12/04/2020 1500 Last data filed at 12/04/2020 0300 Gross per 24 hour  Intake 2200 ml  Output --  Net 2200 ml    LBM: Last BM Date: 12/03/20 Baseline Weight: Weight: 49.9 kg Most recent weight: Weight: 49.9 kg        Time In: 2:00 Time Out: 2:30 Time Total: 30 min Greater than 50%  of this time was spent  counseling and coordinating care related to the above assessment and plan.  Signed by: Asencion Gowda, NP   Please contact Palliative Medicine Team phone at [phone] for questions and concerns.  For individual provider: See Shea Evans

## 2020-12-05 ENCOUNTER — Other Ambulatory Visit (HOSPITAL_COMMUNITY): Payer: Self-pay

## 2020-12-05 DIAGNOSIS — A0472 Enterocolitis due to Clostridium difficile, not specified as recurrent: Secondary | ICD-10-CM

## 2020-12-05 DIAGNOSIS — G20 Parkinson's disease: Secondary | ICD-10-CM

## 2020-12-05 DIAGNOSIS — R6520 Severe sepsis without septic shock: Secondary | ICD-10-CM

## 2020-12-05 DIAGNOSIS — G9340 Encephalopathy, unspecified: Secondary | ICD-10-CM

## 2020-12-05 DIAGNOSIS — L89221 Pressure ulcer of left hip, stage 1: Secondary | ICD-10-CM

## 2020-12-05 DIAGNOSIS — A419 Sepsis, unspecified organism: Secondary | ICD-10-CM

## 2020-12-05 MED ORDER — VANCOMYCIN HCL 125 MG PO CAPS: 125.0000 mg | ORAL_CAPSULE | Freq: Four times a day (QID) | ORAL | 0 refills | Status: DC

## 2020-12-05 MED ORDER — CYCLOBENZAPRINE HCL 5 MG PO TABS: 5.0000 mg | ORAL_TABLET | Freq: Three times a day (TID) | ORAL | 0 refills | Status: AC | PRN

## 2020-12-05 MED ORDER — CYCLOBENZAPRINE HCL 5 MG PO TABS: 5.0000 mg | ORAL_TABLET | Freq: Three times a day (TID) | ORAL | 0 refills | Status: DC | PRN

## 2020-12-05 MED ORDER — POTASSIUM CHLORIDE IN NACL 20-0.9 MEQ/L-% IV SOLN
INTRAVENOUS | Status: DC
  Filled 2020-12-05 (×2): qty 1000

## 2020-12-05 MED ORDER — VANCOMYCIN HCL 125 MG PO CAPS: 125.0000 mg | ORAL_CAPSULE | Freq: Four times a day (QID) | ORAL | 0 refills | Status: AC

## 2020-12-05 NOTE — Progress Notes (Signed)
Daily Progress Note   Patient Name: Jasmine Colon       Date: 11/30/2020 DOB: [birth year]  Age: 80 y.o. MRN#: [id number] Attending Physician: Loletha Grayer, MD Primary Care Physician: Dion Body, MD Admit Date: 12/02/2020  Reason for Consultation/Follow-up: Establishing goals of care  Subjective: Patient is resting in bed with family at bedside. She states she is feeling better as diarrhea has stopped. Family is getting items together for D/C home. Discussed outpatient palliative with patient and family and they are grateful.   Length of Stay: 2  Current Medications: Scheduled Meds:  . aspirin EC  81 mg Oral Daily  . carbidopa-levodopa  1 tablet Oral 6 X Daily  . enoxaparin (LOVENOX) injection  40 mg Subcutaneous Q24H  . feeding supplement  237 mL Oral TID BM  . ferrous sulfate  325 mg Oral Q breakfast  . mirtazapine  30 mg Oral QHS  . multivitamin-lutein  1 capsule Oral Daily  . rasagiline  1 mg Oral Daily  . vancomycin  125 mg Oral Q6H  . cyanocobalamin  100 mcg Oral Daily    Continuous Infusions: . 0.9 % NaCl with KCl 20 mEq / L 50 mL/hr at 12/07/2020 0935  . metronidazole 500 mg (12/16/2020 1226)    PRN Meds: acetaminophen **OR** acetaminophen, fluticasone, ipratropium-albuterol, loratadine, nitroGLYCERIN, ondansetron **OR** ondansetron (ZOFRAN) IV, sodium chloride, traZODone  Physical Exam Pulmonary:     Effort: Pulmonary effort is normal.  Neurological:     Mental Status: She is alert.            Vital Signs: BP (!) 130/104 (BP Location: Left Arm)   Pulse 81   Temp 98.3 F (36.8 C)   Resp 18   Ht '5\' 5"'$  (1.651 m)   Wt 49.9 kg   SpO2 (!) 81%   BMI 18.30 kg/m  SpO2: SpO2: (!) 81 % O2 Device: O2 Device: Room Air O2 Flow Rate: O2 Flow Rate (L/min): 2  L/min  Intake/output summary:  Intake/Output Summary (Last 24 hours) at 11/30/2020 1559 Last data filed at 11/23/2020 1500 Gross per 24 hour  Intake 3103.33 ml  Output 400 ml  Net 2703.33 ml   LBM: Last BM Date: 12/04/2020 Baseline Weight: Weight: 49.9 kg Most recent weight: Weight: 49.9 kg         Patient  Active Problem List   Diagnosis Date Noted  . Sepsis with encephalopathy without septic shock ([place])   . C. difficile colitis   . Protein-calorie malnutrition, severe 12/04/2020  . Ileus ([place])   . Generalized abdominal pain   . Infectious gastroenteritis 12/03/2020  . Pressure injury of skin 12/03/2020  . Hip dislocation, left ([place]) 10/03/2020  . Dislocated hip ([place]) 09/28/2020  . Dislocation of hip prosthesis, initial encounter ([place]) 09/27/2020  . Dislocation of hip joint prosthesis, initial encounter ([place]) 09/27/2020  . Postoperative anemia 09/27/2020  . AKI (acute kidney injury) ([place]) 09/27/2020  . Malnutrition of moderate degree 09/27/2020  . Accidental fall   . Hyperlipidemia   . 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 Left displaced femoral neck fracture ([place]) 09/21/2020  . Breast cancer ([place])   . Malignant neoplasm of upper-outer quadrant of female breast ([place]) 06/20/2013  . 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 Left hip pain 07/19/2012  . Back pain, lumbar 07/19/2012  . Osteoporosis, unspecified 07/19/2012  . 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  . Parkinson disease ([place]) 07/19/2012    Palliative Care Assessment & Plan     Recommendations/Plan: Recommend outpatient palliative.   Code Status:    Code Status Orders  (From admission, onward)           Start     Ordered   12/03/20 0149  Full code  Continuous        12/03/20 0156           Code Status History     Date Active Date Inactive Code Status Order ID Comments User Context   10/04/2020 0000 10/11/2020 2112 Full Code [id number]  Collier Bullock, MD ED   09/27/2020  0702 09/29/2020 2253 DNR [id number]  Sidney Ace, Arvella Merles, MD Inpatient   09/27/2020 0200 09/27/2020 0702 Full Code [id number]  Athena Masse, MD ED   09/21/2020 0748 09/24/2020 2109 DNR [id number]  Sidney Ace, Arvella Merles, MD Inpatient   09/21/2020 0710 09/21/2020 0748 Full Code [id number]  Mansy, Arvella Merles, MD Inpatient      Advance Directive Documentation    Flowsheet Row Most Recent Value  Type of Advance Directive --  [None on file]  Pre-existing out of facility DNR order (yellow form or pink MOST form) --  "MOST" Form in Place? --    Thank you for allowing the Palliative Medicine Team to assist in the care of this patient.       Total Time 15 min Prolonged Time Billed  no       Greater than 50%  of this time was spent counseling and coordinating care related to the above assessment and plan.  Asencion Gowda, NP  Please contact Palliative Medicine Team phone at [phone] for questions and concerns.

## 2020-12-05 NOTE — Discharge Summary (Signed)
[place] [facility] [place] NAME: Jasmine Colon    MR#:  [id number]  DATE OF BIRTH:  [birth year]  DATE OF ADMISSION:  12/02/2020 ADMITTING PHYSICIAN: Christel Mormon, MD  DATE OF DISCHARGE: 11/25/2020  PRIMARY CARE PHYSICIAN: Dion Body, MD    ADMISSION DIAGNOSIS:  Infectious gastroenteritis [A09] Ileus ([place]) [K56.7] Generalized abdominal pain [R10.84] Acute respiratory failure with hypoxia (HCC) [J96.01] Diarrhea, unspecified type [R19.7]  DISCHARGE DIAGNOSIS:  Active Problems:   Infectious gastroenteritis   Pressure injury of skin   Protein-calorie malnutrition, severe   Ileus (HCC)   Generalized abdominal pain   SECONDARY DIAGNOSIS:   Past Medical History:  Diagnosis Date  . Asthma 2012  . Hyperlipidemia   . Lesion of lateral popliteal nerve   . Lower back pain   . 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 Mitral valve disorder   . Osteoarthritis   . Osteoporosis   . Parkinson's disease ([place]) 2012  . Personal history of radiation therapy 2013   right breast ca with lumpectomy and rad tx    HOSPITAL COURSE:   1.  Acute C. difficile colitis.  The patient was started on oral vancomycin and Flagyl.  Patient states that she is not having lots of diarrhea right now.  No abdominal pain.  Poor appetite.  Patient and family want to take her home.  Will prescribe vancomycin 125 mg 4 times daily for another 10 days. 2.  Severe sepsis, present on admission with C. difficile colitis with tachycardia leukocytosis and acute metabolic encephalopathy and delirium.  Continue oral vancomycin. 3.  Possible ileus.  Patient not having any nausea or vomiting.  No abdominal pain.  Patient wanted to go home. 4.  Acute metabolic encephalopathy secondary to sepsis.  This has waxed and waned.  Family wants to take the patient home.  This should improve more at home. 5.   Parkinson's disease continue Sinemet 6.  Severe protein calorie malnutrition 7.  Recent hip fractures and dislocations.  Follows with Dr. Harlow Mares as outpatient 8.  Acute kidney injury.  Creatinine 1.07 on presentation and came down to 0.76. 9.  Palliative care to follow as outpatient  The patient and family just wanted to take the patient home and I think she will eat better at home and her mental status will be better at home.  DISCHARGE CONDITIONS:   Fair  CONSULTS OBTAINED:   None  DRUG ALLERGIES:   Allergies  Allergen Reactions  . Codeine     Feels weird  . Pseudoephedrine     Other reaction(s): Dizziness and giddiness (finding)    DISCHARGE MEDICATIONS:   Allergies as of 11/26/2020       Reactions   Codeine    Feels weird   Pseudoephedrine    Other reaction(s): Dizziness and giddiness (finding)        Medication List     TAKE these medications    acetaminophen 650 MG CR tablet Commonly known as: TYLENOL Take 650 mg by mouth every 8 (eight) hours as needed for pain.   CALTRATE 600+D PO Take 1 tablet by mouth daily.   carbidopa-levodopa 25-100 MG tablet Commonly known as: SINEMET IR Take 1 tablet by mouth 6 (six) times daily.   cyanocobalamin 100 MCG tablet Take 100 mcg by mouth daily.   cyclobenzaprine 5 MG tablet Commonly known as: FLEXERIL Take 1  tablet (5 mg total) by mouth 3 (three) times daily as needed for muscle spasms.   feeding supplement Liqd Take 237 mLs by mouth 3 (three) times daily between meals.   ferrous sulfate 325 (65 FE) MG tablet Take 1 tablet (325 mg total) by mouth daily with breakfast.   fluticasone 50 MCG/ACT nasal spray Commonly known as: FLONASE Place 2 sprays into both nostrils daily as needed for allergies or rhinitis.   loratadine 10 MG tablet Commonly known as: CLARITIN Take 10 mg by mouth daily as needed for allergies.   mirtazapine 30 MG tablet Commonly known as: REMERON Take 30 mg by mouth at bedtime.    PRESERVISION AREDS PO Take 1 capsule by mouth daily.   rasagiline 1 MG Tabs tablet Commonly known as: AZILECT Take 1 tablet (1 mg total) by mouth daily.   sodium chloride 0.65 % Soln nasal spray Commonly known as: OCEAN Place 1 spray into both nostrils daily as needed (allergies).   vancomycin 125 MG capsule Commonly known as: VANCOCIN Take 1 capsule (125 mg total) by mouth every 6 (six) hours for 10 days.         DISCHARGE INSTRUCTIONS:   Follow-up PCP 5 days  If you experience worsening of your admission symptoms, develop shortness of breath, life threatening emergency, suicidal or homicidal thoughts you must seek medical attention immediately by calling 911 or calling your MD immediately  if symptoms less severe.  You Must read complete instructions/literature along with all the possible adverse reactions/side effects for all the Medicines you take and that have been prescribed to you. Take any new Medicines after you have completely understood and accept all the possible adverse reactions/side effects.   Please note  You were cared for by a hospitalist during your hospital stay. If you have any questions about your discharge medications or the care you received while you were in the hospital after you are discharged, you can call the unit and asked to speak with the hospitalist on call if the hospitalist that took care of you is not available. Once you are discharged, your primary care physician will handle any further medical issues. Please note that NO REFILLS for any discharge medications will be authorized once you are discharged, as it is imperative that you return to your primary care physician (or establish a relationship with a primary care physician if you do not have one) for your aftercare needs so that they can reassess your need for medications and monitor your lab values.    Today   CHIEF COMPLAINT:   Chief Complaint  Patient presents with  . Abdominal Pain     HISTORY OF PRESENT ILLNESS:  [name]  is a 80 y.o. female came in with abdominal pain and found to have C. difficile.   VITAL SIGNS:  Blood pressure 96/62, pulse 95, temperature 98.3 F (36.8 C), resp. rate 18, height '5\' 5"'$  (1.651 m), weight 49.9 kg, SpO2 90 %.  I/O:   Intake/Output Summary (Last 24 hours) at 12/04/2020 1438 Last data filed at 12/11/2020 0700 Gross per 24 hour  Intake 2732.5 ml  Output 400 ml  Net 2332.5 ml    PHYSICAL EXAMINATION:  GENERAL:  80 y.o.-year-old patient lying in the bed with no acute distress.  EYES: Pupils equal, round, reactive to light and accommodation. No scleral icterus. HEENT: Head atraumatic, normocephalic. Oropharynx and nasopharynx clear.  LUNGS: Normal breath sounds bilaterally, no wheezing, rales,rhonchi or crepitation. No use of accessory muscles of respiration.  CARDIOVASCULAR: S1, S2 normal. No murmurs, rubs, or gallops.  ABDOMEN: Soft, non-tender, slight distended. Bowel sounds present. EXTREMITIES: No pedal edema.  NEUROLOGIC: Cranial nerves II through XII are intact.  Positive tremor PSYCHIATRIC: The patient is alert and answers some questions appropriately SKIN: Small stage I decubiti left leg and buttock.  DATA REVIEW:   CBC Recent Labs  Lab 12/04/20 0452  WBC 8.1  HGB 9.4*  HCT 27.7*  PLT 444*    Chemistries  Recent Labs  Lab 12/02/20 1523 12/03/20 0742 12/04/20 0452  NA 136   < > 141  K 3.8   < > 3.3*  CL 99   < > 110  CO2 27   < > 23  GLUCOSE 114*   < > 110*  BUN 48*   < > 33*  CREATININE 1.07*   < > 0.76  CALCIUM 9.6   < > 8.3*  MG 2.1  --   --   AST 15  --   --   ALT <5  --   --   ALKPHOS 117  --   --   BILITOT 1.1  --   --    < > = values in this interval not displayed.    Microbiology Results  Results for orders placed or performed during the hospital encounter of 12/02/20  Resp Panel by RT-PCR (Flu A&B, Covid) Nasopharyngeal Swab     Status: None   Collection Time: 12/02/20 10:22 PM    Specimen: Nasopharyngeal Swab; Nasopharyngeal(NP) swabs in vial transport medium  Result Value Ref Range Status   SARS Coronavirus 2 by RT PCR NEGATIVE NEGATIVE Final    Comment: (NOTE) SARS-CoV-2 target nucleic acids are NOT DETECTED.  The SARS-CoV-2 RNA is generally detectable in upper respiratory specimens during the acute phase of infection. The lowest concentration of SARS-CoV-2 viral copies this assay can detect is 138 copies/mL. A negative result does not preclude SARS-Cov-2 infection and should not be used as the sole basis for treatment or other patient management decisions. A negative result may occur with  improper specimen collection/handling, submission of specimen other than nasopharyngeal swab, presence of viral mutation(s) within the areas targeted by this assay, and inadequate number of viral copies(<138 copies/mL). A negative result must be combined with clinical observations, patient history, and epidemiological information. The expected result is Negative.  Fact Sheet for Patients:  EntrepreneurPulse.com.au  Fact Sheet for Healthcare Providers:  IncredibleEmployment.be  This test is no t yet approved or cleared by the [place] FDA and  has been authorized for detection and/or diagnosis of SARS-CoV-2 by FDA under an Emergency Use Authorization (EUA). This EUA will remain  in effect (meaning this test can be used) for the duration of the COVID-19 declaration under Section 564(b)(1) of the Act, 21 U.S.C.section 360bbb-3(b)(1), unless the authorization is terminated  or revoked sooner.       Influenza A by PCR NEGATIVE NEGATIVE Final   Influenza B by PCR NEGATIVE NEGATIVE Final    Comment: (NOTE) The Xpert Xpress SARS-CoV-2/FLU/RSV plus assay is intended as an aid in the diagnosis of influenza from Nasopharyngeal swab specimens and should not be used as a sole basis for treatment. Nasal washings and aspirates are  unacceptable for Xpert Xpress SARS-CoV-2/FLU/RSV testing.  Fact Sheet for Patients: EntrepreneurPulse.com.au  Fact Sheet for Healthcare Providers: IncredibleEmployment.be  This test is not yet approved or cleared by the [place] FDA and has been authorized for detection and/or diagnosis of SARS-CoV-2 by FDA under  an Emergency Use Authorization (EUA). This EUA will remain in effect (meaning this test can be used) for the duration of the COVID-19 declaration under Section 564(b)(1) of the Act, 21 U.S.C. section 360bbb-3(b)(1), unless the authorization is terminated or revoked.  Performed at [facility], [address] [place], [place] [address]   Blood culture (routine single)     Status: None (Preliminary result)   Collection Time: 12/03/20 12:07 AM   Specimen: BLOOD  Result Value Ref Range Status   Specimen Description BLOOD LEFT FA  Final   Special Requests   Final    BOTTLES DRAWN AEROBIC AND ANAEROBIC Blood Culture adequate volume   Culture   Final    NO GROWTH 2 DAYS Performed at [facility], [address], [place], [place] [address]    Report Status PENDING  Incomplete  Gastrointestinal Panel by PCR , Stool     Status: None   Collection Time: 12/03/20  1:12 AM   Specimen: Stool  Result Value Ref Range Status   Campylobacter species NOT DETECTED NOT DETECTED Final   Plesimonas shigelloides NOT DETECTED NOT DETECTED Final   Salmonella species NOT DETECTED NOT DETECTED Final   Yersinia enterocolitica NOT DETECTED NOT DETECTED Final   Vibrio species NOT DETECTED NOT DETECTED Final   Vibrio cholerae NOT DETECTED NOT DETECTED Final   Enteroaggregative E coli (EAEC) NOT DETECTED NOT DETECTED Final   Enteropathogenic E coli (EPEC) NOT DETECTED NOT DETECTED Final   Enterotoxigenic E coli (ETEC) NOT DETECTED NOT DETECTED Final   Shiga like toxin producing E coli (STEC) NOT DETECTED NOT DETECTED Final    Shigella/Enteroinvasive E coli (EIEC) NOT DETECTED NOT DETECTED Final   Cryptosporidium NOT DETECTED NOT DETECTED Final   Cyclospora cayetanensis NOT DETECTED NOT DETECTED Final   Entamoeba histolytica NOT DETECTED NOT DETECTED Final   Giardia lamblia NOT DETECTED NOT DETECTED Final   Adenovirus F40/41 NOT DETECTED NOT DETECTED Final   Astrovirus NOT DETECTED NOT DETECTED Final   Norovirus GI/GII NOT DETECTED NOT DETECTED Final   Rotavirus A NOT DETECTED NOT DETECTED Final   Sapovirus (I, II, IV, and V) NOT DETECTED NOT DETECTED Final    Comment: Performed at [facility], [address] [place], [place] [address]  C Difficile Quick Screen w PCR reflex     Status: Abnormal   Collection Time: 12/03/20  1:12 AM   Specimen: Stool  Result Value Ref Range Status   C Diff antigen POSITIVE (A) NEGATIVE Final   C Diff toxin NEGATIVE NEGATIVE Final   C Diff interpretation Results are indeterminate. See PCR results.  Final    Comment: Performed at [facility], [address] [place], [place] [address]  C. Diff by PCR, Reflexed     Status: Abnormal   Collection Time: 12/03/20  1:12 AM  Result Value Ref Range Status   Toxigenic C. Difficile by PCR POSITIVE (A) NEGATIVE Final    Comment: Positive for toxigenic C. difficile with little to no toxin production. Only treat if clinical presentation suggests symptomatic illness. Performed at [facility], [address], [place], [place] [address]      Management plans discussed with the patient, family and they are in agreement.  CODE STATUS:     Code Status Orders  (From admission, onward)           Start     Ordered   12/03/20 0149  Full code  Continuous  12/03/20 0156           Code Status History     Date Active Date Inactive Code Status Order ID Comments User Context   10/04/2020 0000 10/11/2020 2112 Full Code [id number]  Collier Bullock, MD ED   09/27/2020 0702 09/29/2020 2253 DNR [id number]   Sidney Ace, Arvella Merles, MD Inpatient   09/27/2020 0200 09/27/2020 0702 Full Code [id number]  Athena Masse, MD ED   09/21/2020 0748 09/24/2020 2109 DNR [id number]  Sidney Ace, Arvella Merles, MD Inpatient   09/21/2020 0710 09/21/2020 0748 Full Code [id number]  Mansy, Arvella Merles, MD Inpatient      Advance Directive Documentation    [place] Most Recent Value  Type of Advance Directive --  [None on file]  Pre-existing out of facility DNR order (yellow form or pink MOST form) --  "MOST" Form in Place? --       TOTAL TIME TAKING CARE OF THIS PATIENT:35 minutes.    Loletha Grayer M.D on 12/03/2020 at 2:38 PM  [facility]  CC: Primary care physician; Dion Body, MD

## 2020-12-05 NOTE — Progress Notes (Signed)
[facility] Liaison RN Note  Notified by Isaias Cowman, RN, [place] of patient/family request for [facility] Palliative services at home after discharge.   [facility] hospital liaison will follow patient for discharge position.   Please call with any hospice or outpatient palliative care related questions.   Thank you for the opportunity to participate in this patient's care.   Bobbie "Loren Racer, RN, BSN [facility] Liaison [phone]

## 2020-12-05 NOTE — TOC Transition Note (Signed)
Transition of Care [facility]) - CM/SW Discharge Note   Patient Details  Name: Jasmine Colon MRN: [id number] Date of Birth: 09-29-1940  Transition of Care [facility]) CM/SW Contact:  Beverly Sessions, RN Phone Number: 12/09/2020, 1:55 PM   Clinical Narrative:      Patient will DC [id number] with home health through [facility].  Sarah with [facility] notified Anticipated DC date: 11/18/2020 Family notified:Daughter Transport by: Family  Outpatient palliative referral made to Loren Racer.  Confirmed with Daughter that [employer] has reached out and is assisting on PCS in the home   TOC signing off.  Isaias Cowman [facility] [phone]    Barriers to Discharge: Continued Medical Work up   Patient Goals and CMS Choice        Discharge Placement                       Discharge Plan and Services                          HH Arranged: PT, OT, Nurse's Aide [facility] Agency: [facility] Date [facility] Agency Contacted: 12/04/20   Representative spoke with at [place]: [place] (Lydia) Interventions     Readmission Risk Interventions Readmission Risk Prevention Plan 12/16/2020 12/04/2020  Transportation Screening Complete Complete  Medication Review Press photographer) Complete Complete  PCP or Specialist appointment within 3-5 days of discharge (No Data) -  [place] or [place] Complete Complete  SW Recovery Care/Counseling Consult Complete Complete  Palliative Care Screening Complete Complete  [place] Not Applicable Patient Refused  Some recent data might be hidden

## 2020-12-05 NOTE — Progress Notes (Signed)
SLP Cancellation Note  Patient Details Name: Jasmine Colon MRN: [id number] DOB: [birth year]   Cancelled treatment:       Reason Eval/Treat Not Completed:  (chart reviewed; nsg consulted). Pt is tolerating her current diet of mech soft foods, thin liquids. Recommend continue such as she discharges today w/ general aspiration precautions. If any further needs, recommend f/u w/ PCP. Pt will have [place] services in the home also per chart notes.     Orinda Kenner, MS, CCC-SLP Speech Language Pathologist Rehab Services [phone] [facility] 11/27/2020, 4:36 PM

## 2020-12-08 LAB — CULTURE, BLOOD (SINGLE)
Culture: NO GROWTH
Special Requests: ADEQUATE

## 2020-12-19 DEATH — deceased

## 2022-06-13 IMAGING — DX DG HIP (WITH OR WITHOUT PELVIS) 1V PORT*L*
2 series · 2 of 2 positions shown · non-contrast
Comparison: None.

CLINICAL DATA: Postop LEFT hip surgery

EXAM:
DG HIP (WITH OR WITHOUT PELVIS) 1V PORT LEFT

[pelvis ap]
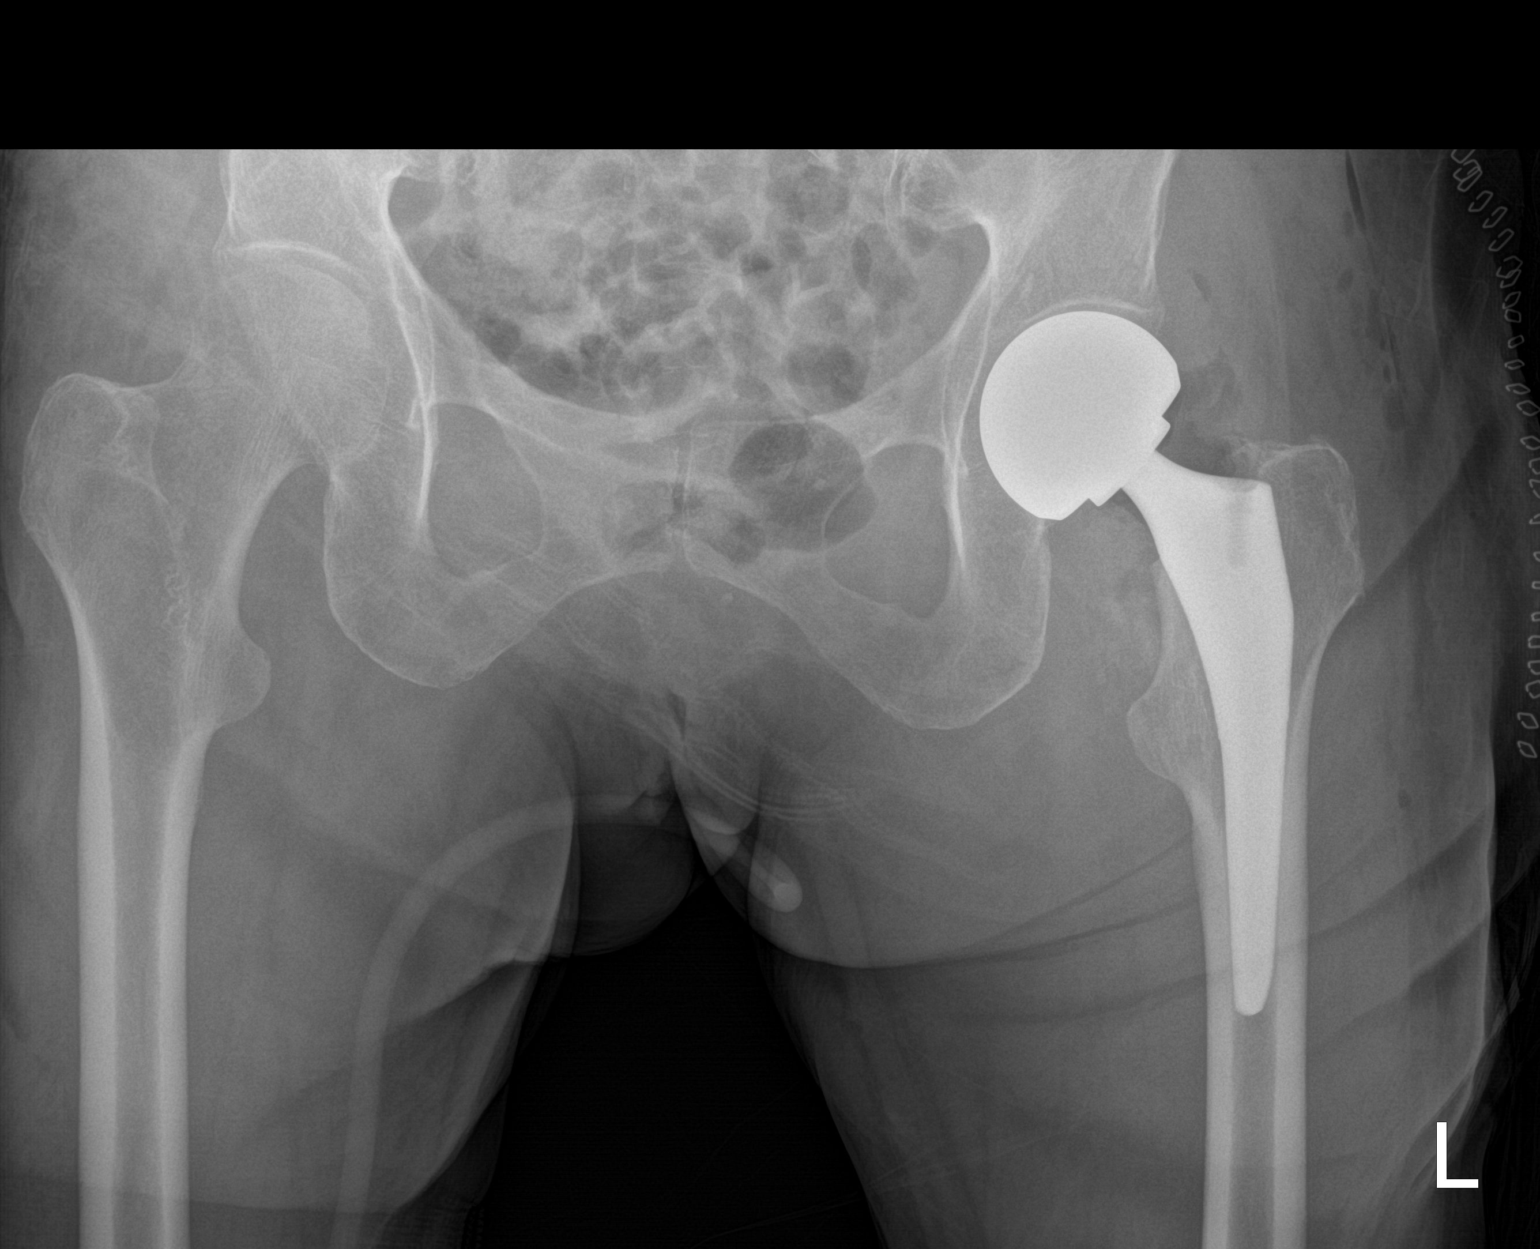

[hip lat]
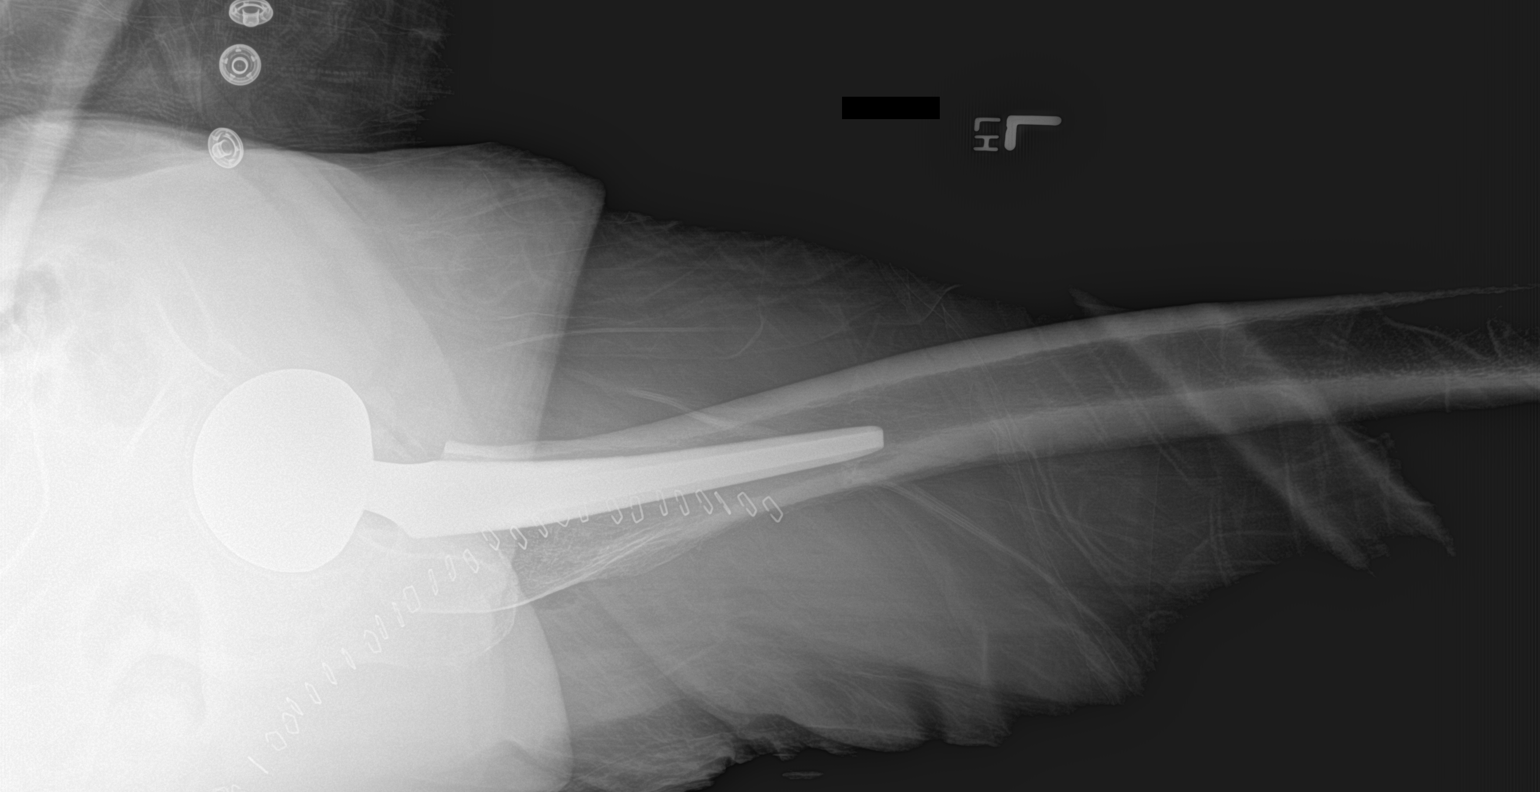

[2 of 2 positions shown; findings below may reference images not displayed]

FINDINGS: Unipolar LEFT hip arthroplasty. Expected soft tissue changes. No
fracture or dislocation.
IMPRESSION: No complication following LEFT hip arthroplasty.

## 2022-06-19 IMAGING — CR DG HIP (WITH OR WITHOUT PELVIS) 2-3V*L*
4 series · 4 of 4 positions shown · non-contrast
Comparison: None.

CLINICAL DATA: Fall, left hip deformity

EXAM:
DG HIP (WITH OR WITHOUT PELVIS) 2-3V LEFT

[pelvis ap]
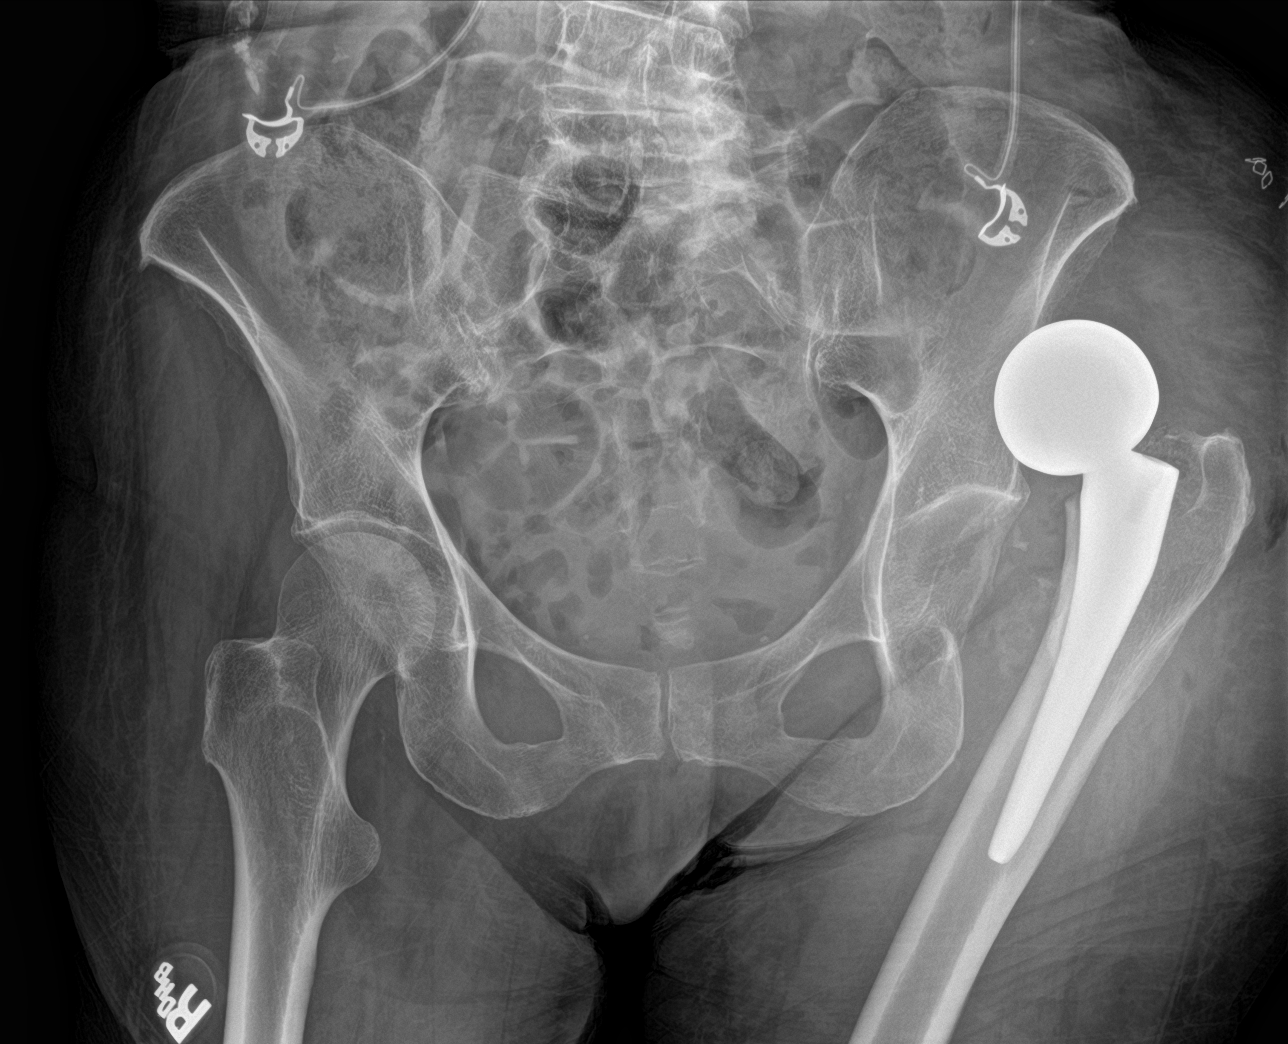

[hip ap]
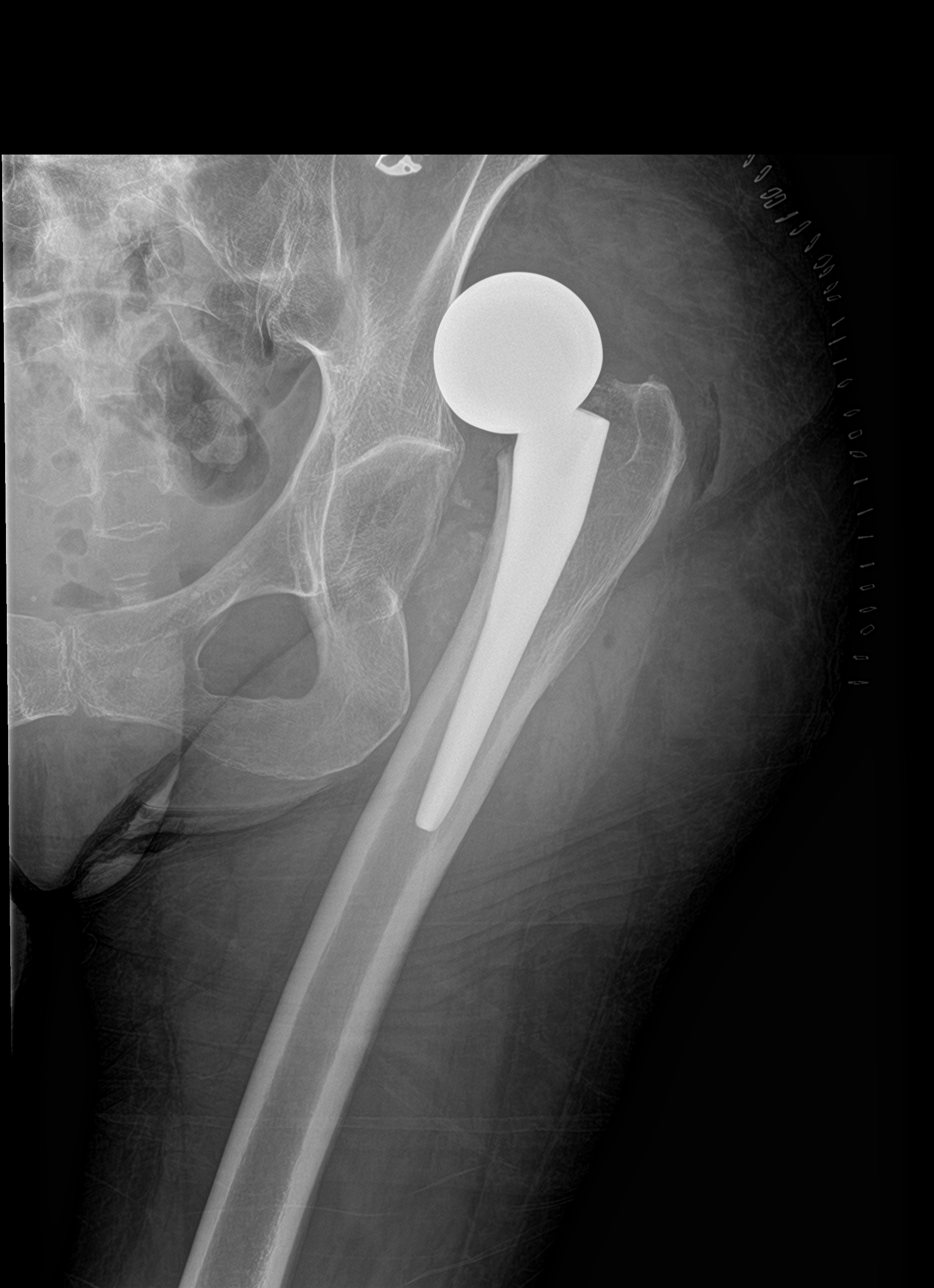

[hip lat (1 of 2)]
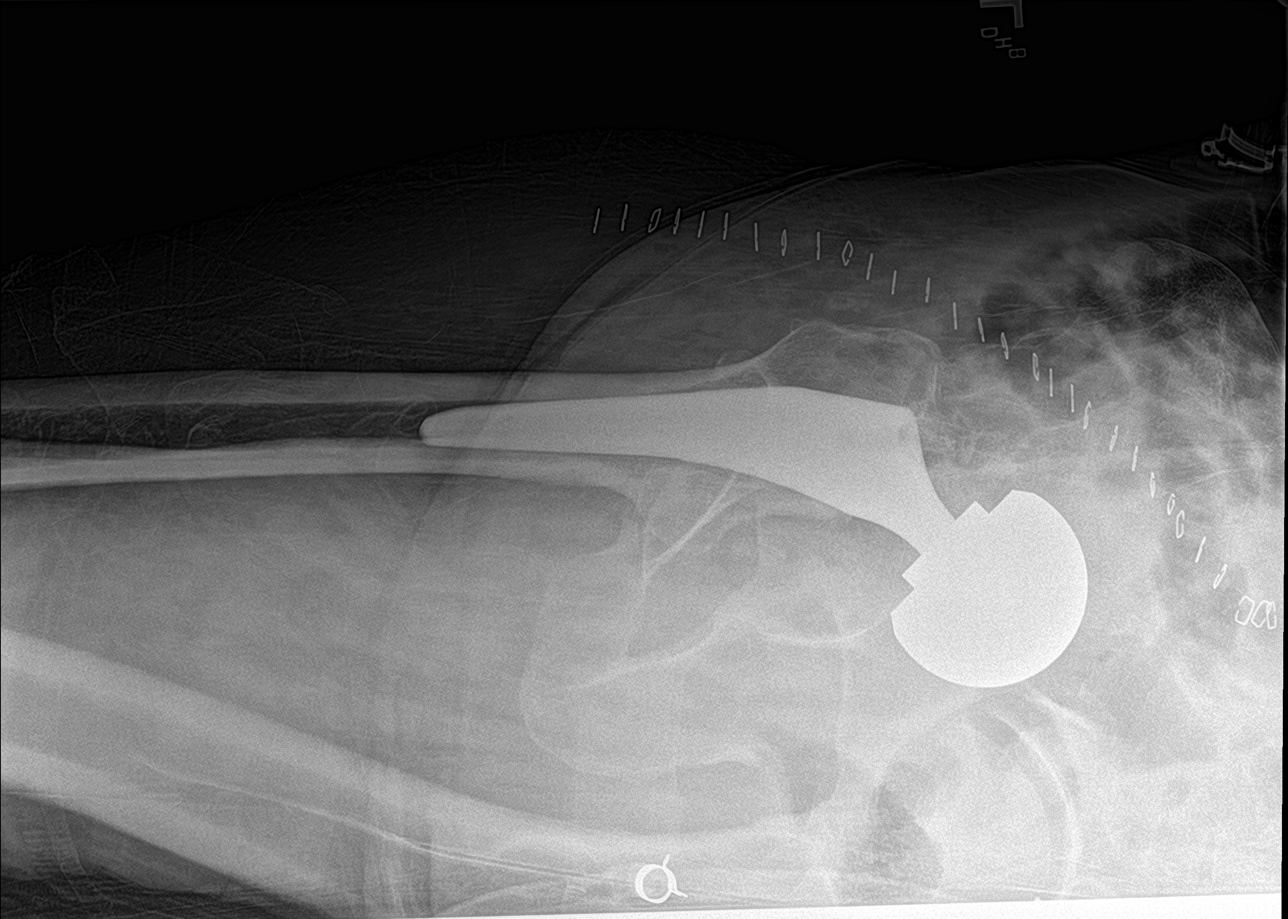

[hip lat (2 of 2)]
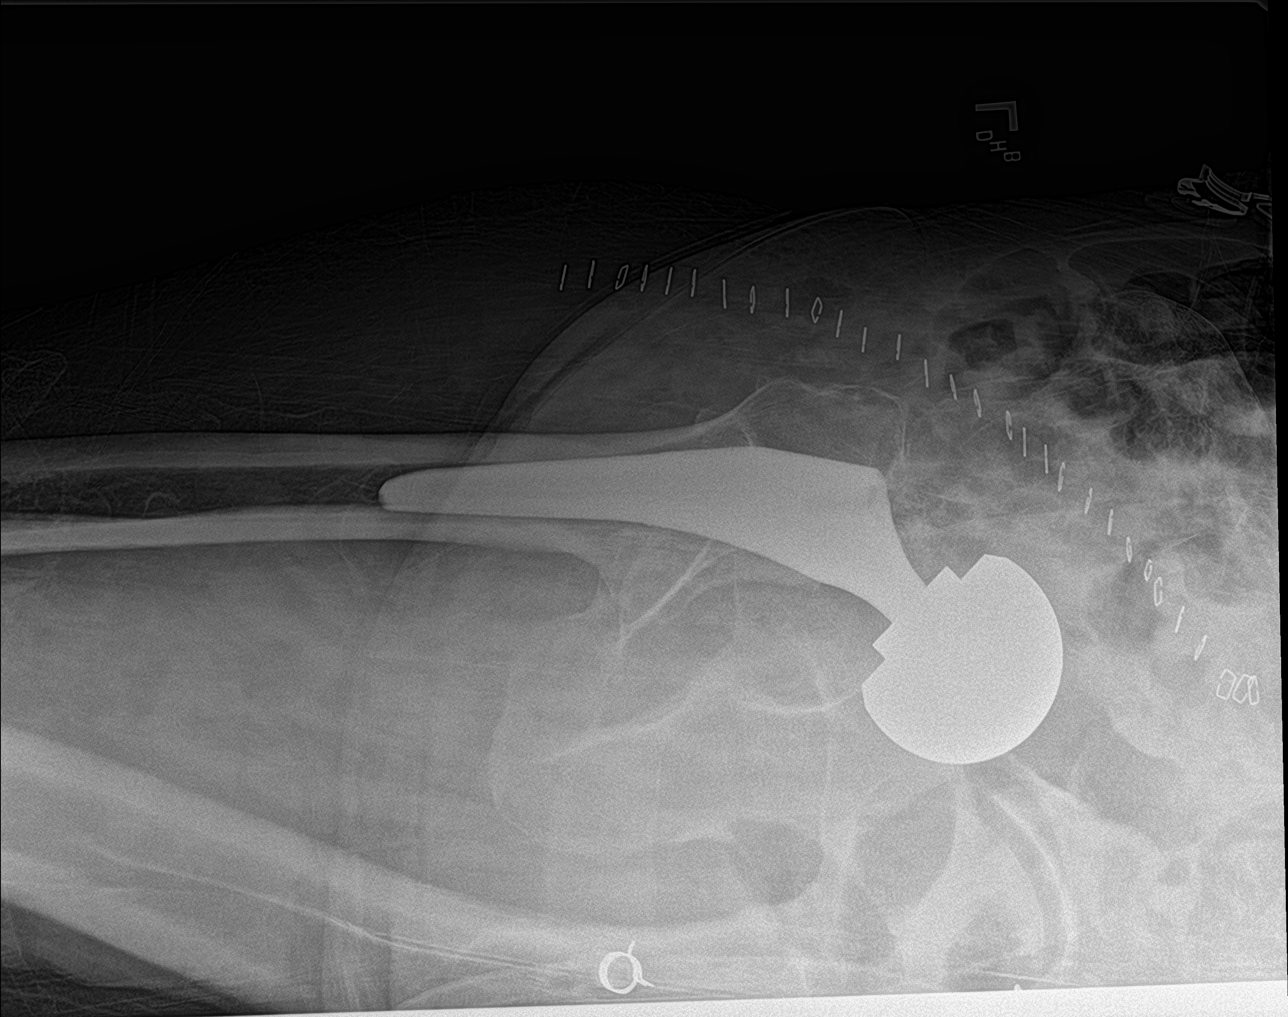

[4 of 4 positions shown; findings below may reference images not displayed]

FINDINGS: Left hip bipolar hemiarthroplasty has been performed. Since prior
examination, there is now superior dislocation in marked internal
rotation of the left hip. Small ossific fragments adjacent to the
left acetabulum may represent the residua of recent arthroplasty
procedure. No displaced fracture identified. The pelvis and
visualized right hip are unremarkable.
IMPRESSION: Dislocation of the left hip bipolar hemiarthroplasty.
